# Patient Record
Sex: Female | Born: 1955 | Race: Asian | Hispanic: No | Marital: Married | Smoking: Never smoker
Health system: Southern US, Community
[De-identification: ages and names within clinical notes are randomized; demographics above are authoritative.]

## PROBLEM LIST (undated history)

## (undated) DIAGNOSIS — M810 Age-related osteoporosis without current pathological fracture: Secondary | ICD-10-CM

## (undated) DIAGNOSIS — J302 Other seasonal allergic rhinitis: Secondary | ICD-10-CM

## (undated) DIAGNOSIS — K649 Unspecified hemorrhoids: Secondary | ICD-10-CM

## (undated) DIAGNOSIS — K5900 Constipation, unspecified: Secondary | ICD-10-CM

---

## 2007-08-03 ENCOUNTER — Ambulatory Visit: Payer: Self-pay | Admitting: Gastroenterology

## 2007-08-03 DIAGNOSIS — K5900 Constipation, unspecified: Secondary | ICD-10-CM | POA: Insufficient documentation

## 2007-08-03 DIAGNOSIS — R17 Unspecified jaundice: Secondary | ICD-10-CM | POA: Insufficient documentation

## 2007-08-03 DIAGNOSIS — K625 Hemorrhage of anus and rectum: Secondary | ICD-10-CM | POA: Insufficient documentation

## 2007-08-04 ENCOUNTER — Ambulatory Visit: Payer: Self-pay | Admitting: Gastroenterology

## 2007-08-04 LAB — CONVERTED CEMR LAB
ALT: 20 units/L (ref 0–35)
Albumin: 4.1 g/dL (ref 3.5–5.2)
BUN: 18 mg/dL (ref 6–23)
Basophils Absolute: 0.1 10*3/uL (ref 0.0–0.1)
Bilirubin, Direct: 0.1 mg/dL (ref 0.0–0.3)
CO2: 24 meq/L (ref 19–32)
Chloride: 107 meq/L (ref 96–112)
Eosinophils Absolute: 0 10*3/uL (ref 0.0–0.7)
Ferritin: 11.7 ng/mL (ref 10.0–291.0)
GFR calc Af Amer: 134 mL/min
GFR calc non Af Amer: 111 mL/min
Glucose, Bld: 79 mg/dL (ref 70–99)
Hemoglobin: 12.5 g/dL (ref 12.0–15.0)
Iron: 216 ug/dL — ABNORMAL HIGH (ref 42–145)
Lymphocytes Relative: 22.1 % (ref 12.0–46.0)
MCHC: 34.7 g/dL (ref 30.0–36.0)
Monocytes Relative: 5.2 % (ref 3.0–12.0)
Neutro Abs: 5 10*3/uL (ref 1.4–7.7)
Neutrophils Relative %: 71.6 % (ref 43.0–77.0)
Platelets: 185 10*3/uL (ref 150–400)
Potassium: 4.4 meq/L (ref 3.5–5.1)
RBC: 3.83 M/uL — ABNORMAL LOW (ref 3.87–5.11)
RDW: 13.2 % (ref 11.5–14.6)
Saturation Ratios: 50.4 % — ABNORMAL HIGH (ref 20.0–50.0)
Sodium: 139 meq/L (ref 135–145)
Total Bilirubin: 1.5 mg/dL — ABNORMAL HIGH (ref 0.3–1.2)
Total Protein: 7.1 g/dL (ref 6.0–8.3)
Vitamin B-12: 932 pg/mL — ABNORMAL HIGH (ref 211–911)

## 2007-08-05 ENCOUNTER — Telehealth: Payer: Self-pay | Admitting: Gastroenterology

## 2007-08-05 DIAGNOSIS — K649 Unspecified hemorrhoids: Secondary | ICD-10-CM | POA: Insufficient documentation

## 2007-08-20 ENCOUNTER — Encounter: Payer: Self-pay | Admitting: Gastroenterology

## 2007-09-15 ENCOUNTER — Encounter: Payer: Self-pay | Admitting: Gastroenterology

## 2007-11-30 ENCOUNTER — Encounter (INDEPENDENT_AMBULATORY_CARE_PROVIDER_SITE_OTHER): Payer: Self-pay | Admitting: *Deleted

## 2007-11-30 ENCOUNTER — Ambulatory Visit (HOSPITAL_COMMUNITY): Admission: RE | Admit: 2007-11-30 | Discharge: 2007-11-30 | Payer: Self-pay | Admitting: *Deleted

## 2007-12-04 ENCOUNTER — Emergency Department (HOSPITAL_COMMUNITY): Admission: EM | Admit: 2007-12-04 | Discharge: 2007-12-04 | Payer: Self-pay | Admitting: Family Medicine

## 2008-05-07 ENCOUNTER — Emergency Department (HOSPITAL_COMMUNITY): Admission: EM | Admit: 2008-05-07 | Discharge: 2008-05-07 | Payer: Self-pay | Admitting: Emergency Medicine

## 2009-06-16 ENCOUNTER — Emergency Department (HOSPITAL_COMMUNITY): Admission: EM | Admit: 2009-06-16 | Discharge: 2009-06-16 | Payer: Self-pay | Admitting: Family Medicine

## 2010-01-18 ENCOUNTER — Other Ambulatory Visit
Admission: RE | Admit: 2010-01-18 | Discharge: 2010-01-18 | Payer: Self-pay | Source: Home / Self Care | Admitting: Registered Nurse

## 2010-06-04 HISTORY — PX: HEMORRHOID SURGERY: SHX153

## 2010-06-04 NOTE — Op Note (Signed)
NAME:  Tiffany Stevens, Tiffany Stevens                   ACCOUNT NO.:  [id number]   MEDICAL RECORD NO.:  [id number]          PATIENT TYPE:  AMB   LOCATION:  DAY                          FACILITY:  [facility]   PHYSICIAN:  Alfonse Ras, MD   DATE OF BIRTH:  22-May-1955   DATE OF PROCEDURE:  DATE OF DISCHARGE:                               OPERATIVE REPORT   PREOPERATIVE DIAGNOSIS:  Grade II and III internal hemorrhoids with  prolapse and bleeding.   POSTOPERATIVE DIAGNOSIS:  Grade II and III internal hemorrhoids with  prolapse and bleeding.   PROCEDURE:  Procedure for prolapse and hemorrhoids, rectopexy with the  Ethicon stapler.   SURGEON:  Alfonse Ras, M.D.   ANESTHESIA:  General.   DESCRIPTION:  The patient was taken to the operating room, and after  informed consent was granted, general anesthesia was induced using  endotracheal tube.  She was placed in the prone jack-knife position.  Rectal and perianal prep were undertaken with Betadine solution and the  vagina was also prepped.  Digital rectal exam was dilated up to 3  fingerbreadths.  Using anoscopy, all 3 hemorrhoidal bundles were  injected with 0.25 Marcaine with epinephrine and Wydase.  This was  massaged in place.  The internal and external sphincter muscles were  injected with 0.25 Marcaine circumferentially.  A 2-0 Prolene  pursestring suture was placed in the mucosa and submucosa, 5-cm proximal  to the dentate line.  When this was accomplished, the stapler was  introduced and the pursestring suture was tied down around the anvil.  The stapler was closed.  The vagina was checked.  There was no evidence  of vaginal rectal involvement.  The stapler was held in place for 45  seconds and then fired.  The staple line was inspected.  There was no  bleeding.  Gelfoam packing was placed, 4 x 4 dressings were applied.  The patient tolerated the procedure well and went to PACU in good  condition.      Alfonse Ras, MD  Electronically Signed     KRE/MEDQ  D:  11/30/2007  T:  11/30/2007  Job:  [id number]   cc:   Vania Rea. Jarold Motto, MD, FACG, FACP, FAGA  [address]  [place]  [place] [address]

## 2010-10-22 LAB — HEMOGLOBIN AND HEMATOCRIT, BLOOD
HCT: 36
Hemoglobin: 11.9 — ABNORMAL LOW

## 2011-09-09 ENCOUNTER — Other Ambulatory Visit (HOSPITAL_COMMUNITY): Payer: Self-pay | Admitting: Internal Medicine

## 2011-09-09 DIAGNOSIS — R319 Hematuria, unspecified: Secondary | ICD-10-CM

## 2011-09-10 ENCOUNTER — Other Ambulatory Visit (HOSPITAL_COMMUNITY): Payer: Self-pay

## 2011-09-16 ENCOUNTER — Ambulatory Visit (HOSPITAL_COMMUNITY)
Admission: RE | Admit: 2011-09-16 | Discharge: 2011-09-16 | Disposition: A | Discharge status: Home / Self Care | Payer: 59 | Source: Ambulatory Visit | Attending: Internal Medicine | Admitting: Internal Medicine

## 2011-09-16 DIAGNOSIS — R319 Hematuria, unspecified: Secondary | ICD-10-CM | POA: Insufficient documentation

## 2013-04-04 ENCOUNTER — Other Ambulatory Visit: Payer: Self-pay | Admitting: Registered Nurse

## 2013-04-04 ENCOUNTER — Other Ambulatory Visit (HOSPITAL_COMMUNITY)
Admission: RE | Admit: 2013-04-04 | Discharge: 2013-04-04 | Disposition: A | Discharge status: Home / Self Care | Payer: 59 | Source: Ambulatory Visit | Attending: Internal Medicine | Admitting: Internal Medicine

## 2013-04-04 DIAGNOSIS — Z01419 Encounter for gynecological examination (general) (routine) without abnormal findings: Secondary | ICD-10-CM | POA: Insufficient documentation

## 2014-05-23 ENCOUNTER — Encounter: Payer: Self-pay | Admitting: Gastroenterology

## 2014-06-27 ENCOUNTER — Encounter (HOSPITAL_COMMUNITY): Payer: Self-pay | Admitting: *Deleted

## 2014-06-27 ENCOUNTER — Emergency Department (HOSPITAL_COMMUNITY): Payer: 59 | Admitting: Certified Registered"

## 2014-06-27 ENCOUNTER — Emergency Department (HOSPITAL_COMMUNITY): Payer: 59

## 2014-06-27 ENCOUNTER — Other Ambulatory Visit: Payer: Self-pay

## 2014-06-27 ENCOUNTER — Other Ambulatory Visit (HOSPITAL_COMMUNITY): Payer: Self-pay

## 2014-06-27 ENCOUNTER — Inpatient Hospital Stay (HOSPITAL_COMMUNITY): Payer: 59

## 2014-06-27 ENCOUNTER — Encounter (HOSPITAL_COMMUNITY): Admission: EM | Disposition: A | Discharge status: Rehabilitation Facility | Payer: Self-pay | Source: Home / Self Care

## 2014-06-27 ENCOUNTER — Inpatient Hospital Stay (HOSPITAL_COMMUNITY): Payer: 59 | Admitting: Certified Registered Nurse Anesthetist

## 2014-06-27 ENCOUNTER — Inpatient Hospital Stay (HOSPITAL_COMMUNITY)
Admission: EM | Admit: 2014-06-27 | Discharge: 2014-07-10 | DRG: 003 | Disposition: A | Discharge status: Rehabilitation Facility | Payer: 59 | Attending: General Surgery | Admitting: General Surgery

## 2014-06-27 DIAGNOSIS — E876 Hypokalemia: Secondary | ICD-10-CM | POA: Diagnosis present

## 2014-06-27 DIAGNOSIS — Z09 Encounter for follow-up examination after completed treatment for conditions other than malignant neoplasm: Secondary | ICD-10-CM

## 2014-06-27 DIAGNOSIS — I609 Nontraumatic subarachnoid hemorrhage, unspecified: Secondary | ICD-10-CM

## 2014-06-27 DIAGNOSIS — R413 Other amnesia: Secondary | ICD-10-CM | POA: Diagnosis present

## 2014-06-27 DIAGNOSIS — D696 Thrombocytopenia, unspecified: Secondary | ICD-10-CM | POA: Diagnosis present

## 2014-06-27 DIAGNOSIS — S069X4D Unspecified intracranial injury with loss of consciousness 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, subsequent encounter: Secondary | ICD-10-CM | POA: Diagnosis not present

## 2014-06-27 DIAGNOSIS — S2220XA Unspecified fracture of sternum, initial encounter for closed fracture: Secondary | ICD-10-CM

## 2014-06-27 DIAGNOSIS — S12400A Unspecified displaced fracture of fifth cervical vertebra, initial encounter for closed fracture: Secondary | ICD-10-CM | POA: Diagnosis present

## 2014-06-27 DIAGNOSIS — S022XXA Fracture of nasal bones, initial encounter for closed fracture: Secondary | ICD-10-CM | POA: Diagnosis present

## 2014-06-27 DIAGNOSIS — J9811 Atelectasis: Secondary | ICD-10-CM | POA: Diagnosis not present

## 2014-06-27 DIAGNOSIS — S069X4S Unspecified intracranial injury with loss of consciousness 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, sequela: Secondary | ICD-10-CM | POA: Diagnosis not present

## 2014-06-27 DIAGNOSIS — S12100S Unspecified displaced fracture of second cervical vertebra, sequela: Secondary | ICD-10-CM | POA: Diagnosis not present

## 2014-06-27 DIAGNOSIS — S12600A Unspecified displaced fracture of seventh cervical vertebra, initial encounter for closed fracture: Secondary | ICD-10-CM

## 2014-06-27 DIAGNOSIS — T794XXA Traumatic shock, initial encounter: Secondary | ICD-10-CM | POA: Diagnosis present

## 2014-06-27 DIAGNOSIS — S12100A Unspecified displaced fracture of second cervical vertebra, initial encounter for closed fracture: Secondary | ICD-10-CM | POA: Diagnosis present

## 2014-06-27 DIAGNOSIS — S020XXA Fracture of vault of skull, initial encounter for closed fracture: Secondary | ICD-10-CM | POA: Diagnosis present

## 2014-06-27 DIAGNOSIS — E872 Acidosis: Secondary | ICD-10-CM | POA: Diagnosis present

## 2014-06-27 DIAGNOSIS — J9690 Respiratory failure, unspecified, unspecified whether with hypoxia or hypercapnia: Secondary | ICD-10-CM

## 2014-06-27 DIAGNOSIS — S272XXA Traumatic hemopneumothorax, initial encounter: Secondary | ICD-10-CM | POA: Diagnosis present

## 2014-06-27 DIAGNOSIS — R578 Other shock: Secondary | ICD-10-CM | POA: Diagnosis not present

## 2014-06-27 DIAGNOSIS — S27321A Contusion of lung, unilateral, initial encounter: Secondary | ICD-10-CM | POA: Diagnosis present

## 2014-06-27 DIAGNOSIS — S7002XA Contusion of left hip, initial encounter: Secondary | ICD-10-CM | POA: Diagnosis present

## 2014-06-27 DIAGNOSIS — T1490XA Injury, unspecified, initial encounter: Secondary | ICD-10-CM

## 2014-06-27 DIAGNOSIS — S02401A Maxillary fracture, unspecified, initial encounter for closed fracture: Secondary | ICD-10-CM | POA: Diagnosis present

## 2014-06-27 DIAGNOSIS — R1310 Dysphagia, unspecified: Secondary | ICD-10-CM | POA: Diagnosis present

## 2014-06-27 DIAGNOSIS — M810 Age-related osteoporosis without current pathological fracture: Secondary | ICD-10-CM | POA: Diagnosis present

## 2014-06-27 DIAGNOSIS — R339 Retention of urine, unspecified: Secondary | ICD-10-CM | POA: Diagnosis not present

## 2014-06-27 DIAGNOSIS — N390 Urinary tract infection, site not specified: Secondary | ICD-10-CM | POA: Diagnosis not present

## 2014-06-27 DIAGNOSIS — Z4659 Encounter for fitting and adjustment of other gastrointestinal appliance and device: Secondary | ICD-10-CM

## 2014-06-27 DIAGNOSIS — S12110A Anterior displaced Type II dens fracture, initial encounter for closed fracture: Secondary | ICD-10-CM | POA: Diagnosis present

## 2014-06-27 DIAGNOSIS — J939 Pneumothorax, unspecified: Secondary | ICD-10-CM

## 2014-06-27 DIAGNOSIS — IMO0002 Reserved for concepts with insufficient information to code with codable children: Secondary | ICD-10-CM

## 2014-06-27 DIAGNOSIS — S066X1A Traumatic subarachnoid hemorrhage with loss of consciousness of 30 minutes or less, initial encounter: Secondary | ICD-10-CM | POA: Diagnosis present

## 2014-06-27 DIAGNOSIS — S12101A Unspecified nondisplaced fracture of second cervical vertebra, initial encounter for closed fracture: Principal | ICD-10-CM | POA: Diagnosis present

## 2014-06-27 DIAGNOSIS — S069X1S Unspecified intracranial injury with loss of consciousness of 30 minutes or less, sequela: Secondary | ICD-10-CM | POA: Diagnosis not present

## 2014-06-27 DIAGNOSIS — J942 Hemothorax: Secondary | ICD-10-CM

## 2014-06-27 DIAGNOSIS — R55 Syncope and collapse: Secondary | ICD-10-CM | POA: Diagnosis present

## 2014-06-27 DIAGNOSIS — S299XXA Unspecified injury of thorax, initial encounter: Secondary | ICD-10-CM

## 2014-06-27 DIAGNOSIS — S27329A Contusion of lung, unspecified, initial encounter: Secondary | ICD-10-CM | POA: Diagnosis present

## 2014-06-27 DIAGNOSIS — Z0189 Encounter for other specified special examinations: Secondary | ICD-10-CM

## 2014-06-27 DIAGNOSIS — S023XXA Fracture of orbital floor, initial encounter for closed fracture: Secondary | ICD-10-CM | POA: Diagnosis present

## 2014-06-27 DIAGNOSIS — J156 Pneumonia due to other aerobic Gram-negative bacteria: Secondary | ICD-10-CM | POA: Diagnosis not present

## 2014-06-27 DIAGNOSIS — T1490 Injury, unspecified: Secondary | ICD-10-CM | POA: Diagnosis present

## 2014-06-27 DIAGNOSIS — S8002XA Contusion of left knee, initial encounter: Secondary | ICD-10-CM | POA: Diagnosis present

## 2014-06-27 DIAGNOSIS — M79609 Pain in unspecified limb: Secondary | ICD-10-CM | POA: Diagnosis not present

## 2014-06-27 DIAGNOSIS — S2242XS Multiple fractures of ribs, left side, sequela: Secondary | ICD-10-CM | POA: Diagnosis not present

## 2014-06-27 DIAGNOSIS — S12500A Unspecified displaced fracture of sixth cervical vertebra, initial encounter for closed fracture: Secondary | ICD-10-CM | POA: Diagnosis present

## 2014-06-27 DIAGNOSIS — S12100G Unspecified displaced fracture of second cervical vertebra, subsequent encounter for fracture with delayed healing: Secondary | ICD-10-CM | POA: Diagnosis not present

## 2014-06-27 DIAGNOSIS — S0219XA Other fracture of base of skull, initial encounter for closed fracture: Secondary | ICD-10-CM | POA: Diagnosis present

## 2014-06-27 DIAGNOSIS — R001 Bradycardia, unspecified: Secondary | ICD-10-CM | POA: Diagnosis present

## 2014-06-27 DIAGNOSIS — S069X4A Unspecified intracranial injury with loss of consciousness 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, initial encounter: Secondary | ICD-10-CM | POA: Diagnosis present

## 2014-06-27 DIAGNOSIS — J9600 Acute respiratory failure, unspecified whether with hypoxia or hypercapnia: Secondary | ICD-10-CM | POA: Diagnosis present

## 2014-06-27 DIAGNOSIS — S8001XA Contusion of right knee, initial encounter: Secondary | ICD-10-CM | POA: Diagnosis present

## 2014-06-27 DIAGNOSIS — S2232XA Fracture of one rib, left side, initial encounter for closed fracture: Secondary | ICD-10-CM

## 2014-06-27 DIAGNOSIS — R221 Localized swelling, mass and lump, neck: Secondary | ICD-10-CM

## 2014-06-27 DIAGNOSIS — J90 Pleural effusion, not elsewhere classified: Secondary | ICD-10-CM | POA: Diagnosis present

## 2014-06-27 DIAGNOSIS — S0292XS Unspecified fracture of facial bones, sequela: Secondary | ICD-10-CM | POA: Diagnosis not present

## 2014-06-27 DIAGNOSIS — T884XXA Failed or difficult intubation, initial encounter: Secondary | ICD-10-CM

## 2014-06-27 DIAGNOSIS — S2242XA Multiple fractures of ribs, left side, initial encounter for closed fracture: Secondary | ICD-10-CM | POA: Diagnosis present

## 2014-06-27 DIAGNOSIS — S0990XA Unspecified injury of head, initial encounter: Secondary | ICD-10-CM

## 2014-06-27 DIAGNOSIS — S41112A Laceration without foreign body of left upper arm, initial encounter: Secondary | ICD-10-CM | POA: Diagnosis present

## 2014-06-27 DIAGNOSIS — S0292XA Unspecified fracture of facial bones, initial encounter for closed fracture: Secondary | ICD-10-CM

## 2014-06-27 DIAGNOSIS — Z930 Tracheostomy status: Secondary | ICD-10-CM | POA: Diagnosis not present

## 2014-06-27 DIAGNOSIS — S12600S Unspecified displaced fracture of seventh cervical vertebra, sequela: Secondary | ICD-10-CM | POA: Diagnosis not present

## 2014-06-27 DIAGNOSIS — D62 Acute posthemorrhagic anemia: Secondary | ICD-10-CM | POA: Diagnosis not present

## 2014-06-27 DIAGNOSIS — Z9689 Presence of other specified functional implants: Secondary | ICD-10-CM

## 2014-06-27 DIAGNOSIS — M4322 Fusion of spine, cervical region: Secondary | ICD-10-CM

## 2014-06-27 DIAGNOSIS — S06331A Contusion and laceration of cerebrum, unspecified, with loss of consciousness of 30 minutes or less, initial encounter: Secondary | ICD-10-CM | POA: Diagnosis present

## 2014-06-27 HISTORY — DX: Constipation, unspecified: K59.00

## 2014-06-27 HISTORY — DX: Age-related osteoporosis without current pathological fracture: M81.0

## 2014-06-27 HISTORY — DX: Other seasonal allergic rhinitis: J30.2

## 2014-06-27 HISTORY — DX: Unspecified hemorrhoids: K64.9

## 2014-06-27 HISTORY — PX: ANTERIOR CERVICAL DECOMP/DISCECTOMY FUSION: SHX1161

## 2014-06-27 LAB — COMPREHENSIVE METABOLIC PANEL
ALT: 112 U/L — AB (ref 14–54)
AST: 177 U/L — AB (ref 15–41)
Albumin: 3.3 g/dL — ABNORMAL LOW (ref 3.5–5.0)
Alkaline Phosphatase: 44 U/L (ref 38–126)
Anion gap: 11 (ref 5–15)
BUN: 10 mg/dL (ref 6–20)
CALCIUM: 7.7 mg/dL — AB (ref 8.9–10.3)
CO2: 20 mmol/L — ABNORMAL LOW (ref 22–32)
CREATININE: 0.62 mg/dL (ref 0.44–1.00)
Chloride: 106 mmol/L (ref 101–111)
GFR calc non Af Amer: >60 mL/min (ref >60)
Glucose, Bld: 156 mg/dL — ABNORMAL HIGH (ref 65–99)
POTASSIUM: 3 mmol/L — AB (ref 3.5–5.1)
Sodium: 137 mmol/L (ref 135–145)
TOTAL PROTEIN: 5.7 g/dL — AB (ref 6.5–8.1)
Total Bilirubin: 0.7 mg/dL (ref 0.3–1.2)

## 2014-06-27 LAB — CBC
HEMATOCRIT: 33.9 % — AB (ref 36.0–46.0)
HEMATOCRIT: 31 % — AB (ref 36.0–46.0)
Hemoglobin: 11.3 g/dL — ABNORMAL LOW (ref 12.0–15.0)
Hemoglobin: 10.8 g/dL — ABNORMAL LOW (ref 12.0–15.0)
MCH: 32.5 pg (ref 26.0–34.0)
MCH: 30.6 pg (ref 26.0–34.0)
MCHC: 33.3 g/dL (ref 30.0–36.0)
MCHC: 34.8 g/dL (ref 30.0–36.0)
MCV: 97.4 fL (ref 78.0–100.0)
MCV: 87.8 fL (ref 78.0–100.0)
PLATELETS: 90 10*3/uL — AB (ref 150–400)
Platelets: 205 10*3/uL (ref 150–400)
RBC: 3.48 MIL/uL — AB (ref 3.87–5.11)
RBC: 3.53 MIL/uL — ABNORMAL LOW (ref 3.87–5.11)
RDW: 13.3 % (ref 11.5–15.5)
RDW: 16.1 % — ABNORMAL HIGH (ref 11.5–15.5)
WBC: 12.5 10*3/uL — AB (ref 4.0–10.5)
WBC: 7.8 10*3/uL (ref 4.0–10.5)

## 2014-06-27 LAB — TRIGLYCERIDES: Triglycerides: 108 mg/dL (ref <150)

## 2014-06-27 LAB — I-STAT CHEM 8, ED
BUN: 13 mg/dL (ref 6–20)
CALCIUM ION: 1.08 mmol/L — AB (ref 1.13–1.30)
CREATININE: 0.6 mg/dL (ref 0.44–1.00)
Chloride: 107 mmol/L (ref 101–111)
Glucose, Bld: 156 mg/dL — ABNORMAL HIGH (ref 65–99)
HCT: 33 % — ABNORMAL LOW (ref 36.0–46.0)
HEMOGLOBIN: 11.2 g/dL — AB (ref 12.0–15.0)
Potassium: 3.1 mmol/L — ABNORMAL LOW (ref 3.5–5.1)
Sodium: 141 mmol/L (ref 135–145)
TCO2: 19 mmol/L (ref 0–100)

## 2014-06-27 LAB — URINALYSIS, ROUTINE W REFLEX MICROSCOPIC
Bilirubin Urine: NEGATIVE
GLUCOSE, UA: NEGATIVE mg/dL
KETONES UR: 15 mg/dL — AB
Leukocytes, UA: NEGATIVE
NITRITE: NEGATIVE
PROTEIN: NEGATIVE mg/dL
SPECIFIC GRAVITY, URINE: 1.02 (ref 1.005–1.030)
Urobilinogen, UA: 0.2 mg/dL (ref 0.0–1.0)
pH: 7 (ref 5.0–8.0)

## 2014-06-27 LAB — ETHANOL: Alcohol, Ethyl (B): <5 mg/dL (ref <5)

## 2014-06-27 LAB — POCT I-STAT 3, ART BLOOD GAS (G3+)
ACID-BASE DEFICIT: 5 mmol/L — AB (ref 0.0–2.0)
Bicarbonate: 21 meq/L (ref 20.0–24.0)
O2 Saturation: 100 %
PH ART: 7.336 — AB (ref 7.350–7.450)
Patient temperature: 34.5
TCO2: 22 mmol/L (ref 0–100)
pCO2 arterial: 38.1 mmHg (ref 35.0–45.0)
pO2, Arterial: 372 mmHg — ABNORMAL HIGH (ref 80.0–100.0)

## 2014-06-27 LAB — PROTIME-INR
INR: 1.23 (ref 0.00–1.49)
Prothrombin Time: 15.7 seconds — ABNORMAL HIGH (ref 11.6–15.2)

## 2014-06-27 LAB — CDS SEROLOGY

## 2014-06-27 LAB — BLOOD PRODUCT ORDER (VERBAL) VERIFICATION

## 2014-06-27 LAB — MRSA PCR SCREENING: MRSA BY PCR: NEGATIVE

## 2014-06-27 LAB — I-STAT CG4 LACTIC ACID, ED: LACTIC ACID, VENOUS: 1.6 mmol/L (ref 0.5–2.0)

## 2014-06-27 LAB — URINE MICROSCOPIC-ADD ON

## 2014-06-27 LAB — PREPARE RBC (CROSSMATCH)

## 2014-06-27 SURGERY — ANTERIOR CERVICAL DECOMPRESSION/DISCECTOMY FUSION 1 LEVEL
Anesthesia: General | Site: Spine Cervical

## 2014-06-27 MED ORDER — ROCURONIUM BROMIDE 50 MG/5ML IV SOLN
INTRAVENOUS | Status: AC | PRN
  Administered 2014-06-27: 50 mg via INTRAVENOUS

## 2014-06-27 MED ORDER — SODIUM CHLORIDE 0.9 % IV SOLN: INTRAVENOUS | Status: DC | PRN

## 2014-06-27 MED ORDER — MICROFIBRILLAR COLL HEMOSTAT EX PADS
MEDICATED_PAD | CUTANEOUS | Status: DC | PRN
  Administered 2014-06-27 (×2): 1 via TOPICAL

## 2014-06-27 MED ORDER — FENTANYL CITRATE (PF) 100 MCG/2ML IJ SOLN
50.0000 ug | Freq: Once | INTRAMUSCULAR | Status: AC
  Administered 2014-07-04: 50 ug via INTRAVENOUS
  Administered 2014-07-04 (×2): 100 ug via INTRAVENOUS

## 2014-06-27 MED ORDER — FENTANYL CITRATE (PF) 2500 MCG/50ML IJ SOLN
10.0000 ug/h | INTRAMUSCULAR | Status: DC
  Administered 2014-06-27: 25 ug/h via INTRAVENOUS
  Filled 2014-06-27: qty 50

## 2014-06-27 MED ORDER — LACTATED RINGERS IV SOLN
INTRAVENOUS | Status: DC | PRN
  Administered 2014-06-27 (×2): via INTRAVENOUS

## 2014-06-27 MED ORDER — CHLORHEXIDINE GLUCONATE 0.12 % MT SOLN
15.0000 mL | Freq: Two times a day (BID) | OROMUCOSAL | Status: DC
  Administered 2014-06-27 – 2014-07-10 (×26): 15 mL via OROMUCOSAL
  Filled 2014-06-27 (×28): qty 15

## 2014-06-27 MED ORDER — ALBUMIN HUMAN 5 % IV SOLN
INTRAVENOUS | Status: AC
Start: 2014-06-27 — End: 2014-06-28
  Filled 2014-06-27: qty 250

## 2014-06-27 MED ORDER — SODIUM CHLORIDE 0.9 % IV BOLUS (SEPSIS)
500.0000 mL | Freq: Once | INTRAVENOUS | Status: AC
  Administered 2014-06-27: 500 mL via INTRAVENOUS

## 2014-06-27 MED ORDER — SODIUM CHLORIDE 0.9 % IV SOLN: 1000.0000 mL | Freq: Once | INTRAVENOUS | Status: DC

## 2014-06-27 MED ORDER — FENTANYL CITRATE (PF) 100 MCG/2ML IJ SOLN
INTRAMUSCULAR | Status: DC | PRN
  Administered 2014-06-27: 50 ug via INTRAVENOUS

## 2014-06-27 MED ORDER — ROCURONIUM BROMIDE 100 MG/10ML IV SOLN
INTRAVENOUS | Status: DC | PRN
  Administered 2014-06-27 (×2): 50 mg via INTRAVENOUS

## 2014-06-27 MED ORDER — SODIUM CHLORIDE 0.9 % IV BOLUS (SEPSIS)
2000.0000 mL | Freq: Once | INTRAVENOUS | Status: AC
  Administered 2014-06-27: 2000 mL via INTRAVENOUS

## 2014-06-27 MED ORDER — DEXTROSE 5 % IV SOLN
0.0000 ug/min | INTRAVENOUS | Status: DC
  Administered 2014-06-27: 20 ug/min via INTRAVENOUS
  Administered 2014-06-27: 60 ug/min via INTRAVENOUS
  Administered 2014-06-28: 70 ug/min via INTRAVENOUS
  Administered 2014-06-28 (×2): 60 ug/min via INTRAVENOUS
  Filled 2014-06-27 (×5): qty 1

## 2014-06-27 MED ORDER — PHENYLEPHRINE HCL 10 MG/ML IJ SOLN
10.0000 mg | INTRAVENOUS | Status: DC | PRN
  Administered 2014-06-27: 40 ug/min via INTRAVENOUS

## 2014-06-27 MED ORDER — PANTOPRAZOLE SODIUM 40 MG IV SOLR
40.0000 mg | Freq: Every day | INTRAVENOUS | Status: DC
  Administered 2014-06-27 – 2014-06-28 (×2): 40 mg via INTRAVENOUS
  Filled 2014-06-27 (×3): qty 40

## 2014-06-27 MED ORDER — THROMBIN 5000 UNITS EX SOLR
CUTANEOUS | Status: DC | PRN
  Administered 2014-06-27 (×2): 5000 [IU] via TOPICAL

## 2014-06-27 MED ORDER — ALBUMIN HUMAN 5 % IV SOLN
INTRAVENOUS | Status: AC
  Filled 2014-06-27: qty 250

## 2014-06-27 MED ORDER — ONDANSETRON HCL 4 MG/2ML IJ SOLN: 4.0000 mg | Freq: Four times a day (QID) | INTRAMUSCULAR | Status: DC | PRN

## 2014-06-27 MED ORDER — PROPOFOL 10 MG/ML IV BOLUS
10.0000 mg | Freq: Once | INTRAVENOUS | Status: AC
  Administered 2014-06-27: 10 mg via INTRAVENOUS

## 2014-06-27 MED ORDER — 0.9 % SODIUM CHLORIDE (POUR BTL) OPTIME
TOPICAL | Status: DC | PRN
  Administered 2014-06-27: 1000 mL

## 2014-06-27 MED ORDER — ETOMIDATE 2 MG/ML IV SOLN
INTRAVENOUS | Status: AC | PRN
  Administered 2014-06-27 (×2): 20 mg via INTRAVENOUS

## 2014-06-27 MED ORDER — FENTANYL CITRATE (PF) 250 MCG/5ML IJ SOLN
INTRAMUSCULAR | Status: AC
  Filled 2014-06-27: qty 5

## 2014-06-27 MED ORDER — DEXAMETHASONE SODIUM PHOSPHATE 10 MG/ML IJ SOLN
INTRAMUSCULAR | Status: DC | PRN
  Administered 2014-06-27: 10 mg via INTRAVENOUS

## 2014-06-27 MED ORDER — SODIUM CHLORIDE 0.9 % IV SOLN
25.0000 ug/h | INTRAVENOUS | Status: DC
  Administered 2014-06-27: 250 ug/h via INTRAVENOUS
  Administered 2014-06-27: 25 ug/h via INTRAVENOUS
  Administered 2014-06-28: 150 ug/h via INTRAVENOUS
  Administered 2014-06-29 – 2014-06-30 (×4): 200 ug/h via INTRAVENOUS
  Administered 2014-07-01: 250 ug/h via INTRAVENOUS
  Administered 2014-07-02: 200 ug/h via INTRAVENOUS
  Administered 2014-07-02: 300 ug/h via INTRAVENOUS
  Administered 2014-07-03: 200 ug/h via INTRAVENOUS
  Administered 2014-07-03: 250 ug/h via INTRAVENOUS
  Administered 2014-07-04: 300 ug/h via INTRAVENOUS
  Administered 2014-07-04 – 2014-07-05 (×3): 325 ug/h via INTRAVENOUS
  Administered 2014-07-05: 350 ug/h via INTRAVENOUS
  Administered 2014-07-05: 325 ug/h via INTRAVENOUS
  Administered 2014-07-06: 100 ug/h via INTRAVENOUS
  Filled 2014-06-27 (×22): qty 50

## 2014-06-27 MED ORDER — MIDAZOLAM HCL 2 MG/2ML IJ SOLN
INTRAMUSCULAR | Status: AC
  Filled 2014-06-27: qty 2

## 2014-06-27 MED ORDER — PANTOPRAZOLE SODIUM 40 MG PO TBEC: 40.0000 mg | DELAYED_RELEASE_TABLET | Freq: Every day | ORAL | Status: DC

## 2014-06-27 MED ORDER — CEFAZOLIN SODIUM-DEXTROSE 2-3 GM-% IV SOLR
INTRAVENOUS | Status: DC | PRN
  Administered 2014-06-27: 2 g via INTRAVENOUS

## 2014-06-27 MED ORDER — THROMBIN 5000 UNITS EX SOLR
OROMUCOSAL | Status: DC | PRN
  Administered 2014-06-27: 5 mL via TOPICAL

## 2014-06-27 MED ORDER — ONDANSETRON HCL 4 MG PO TABS: 4.0000 mg | ORAL_TABLET | Freq: Four times a day (QID) | ORAL | Status: DC | PRN

## 2014-06-27 MED ORDER — ALBUMIN HUMAN 5 % IV SOLN
25.0000 g | Freq: Once | INTRAVENOUS | Status: AC
  Administered 2014-06-27: 25 g via INTRAVENOUS
  Filled 2014-06-27: qty 500

## 2014-06-27 MED ORDER — PROPOFOL 1000 MG/100ML IV EMUL
5.0000 ug/kg/min | INTRAVENOUS | Status: DC
  Administered 2014-06-27 (×2): 10 ug/kg/min via INTRAVENOUS
  Administered 2014-06-27: 27.778 ug/kg/min via INTRAVENOUS
  Administered 2014-06-28: 30 ug/kg/min via INTRAVENOUS
  Administered 2014-06-28: 15 ug/kg/min via INTRAVENOUS
  Administered 2014-06-29: 20 ug/kg/min via INTRAVENOUS
  Administered 2014-06-29: 10 ug/kg/min via INTRAVENOUS
  Administered 2014-06-29: 30 ug/kg/min via INTRAVENOUS
  Filled 2014-06-27 (×5): qty 100

## 2014-06-27 MED ORDER — SUCCINYLCHOLINE CHLORIDE 20 MG/ML IJ SOLN
INTRAMUSCULAR | Status: AC | PRN
Start: 2014-06-27 — End: 2014-06-27
  Administered 2014-06-27: 100 mg via INTRAVENOUS

## 2014-06-27 MED ORDER — SODIUM CHLORIDE 0.9 % IV SOLN
INTRAVENOUS | Status: DC | PRN
  Administered 2014-06-27 (×2): via INTRAVENOUS

## 2014-06-27 MED ORDER — POTASSIUM CHLORIDE IN NACL 20-0.9 MEQ/L-% IV SOLN
INTRAVENOUS | Status: DC
  Administered 2014-06-27 – 2014-06-29 (×4): via INTRAVENOUS
  Administered 2014-06-29: 125 mL/h via INTRAVENOUS
  Administered 2014-06-30 – 2014-07-01 (×4): via INTRAVENOUS
  Administered 2014-07-01 – 2014-07-02 (×2): 125 mL/h via INTRAVENOUS
  Administered 2014-07-03 (×2): via INTRAVENOUS
  Administered 2014-07-04: 100 mL/h via INTRAVENOUS
  Administered 2014-07-04 (×2): via INTRAVENOUS
  Administered 2014-07-05: 100 mL/h via INTRAVENOUS
  Administered 2014-07-05 – 2014-07-06 (×3): via INTRAVENOUS
  Filled 2014-06-27 (×37): qty 1000

## 2014-06-27 MED ORDER — ACETAMINOPHEN 650 MG RE SUPP
650.0000 mg | RECTAL | Status: DC | PRN
  Administered 2014-06-30: 650 mg via RECTAL
  Filled 2014-06-27 (×2): qty 1

## 2014-06-27 MED ORDER — POTASSIUM CHLORIDE 10 MEQ/100ML IV SOLN
10.0000 meq | INTRAVENOUS | Status: AC
  Administered 2014-06-27 (×2): 10 meq via INTRAVENOUS
  Filled 2014-06-27 (×2): qty 100

## 2014-06-27 MED ORDER — IPRATROPIUM-ALBUTEROL 0.5-2.5 (3) MG/3ML IN SOLN
3.0000 mL | Freq: Four times a day (QID) | RESPIRATORY_TRACT | Status: DC
  Administered 2014-06-27 – 2014-07-10 (×52): 3 mL via RESPIRATORY_TRACT
  Filled 2014-06-27 (×53): qty 3

## 2014-06-27 MED ORDER — DOCUSATE SODIUM 50 MG/5ML PO LIQD
100.0000 mg | Freq: Two times a day (BID) | ORAL | Status: DC | PRN
  Administered 2014-07-01 – 2014-07-02 (×2): 100 mg
  Filled 2014-06-27 (×4): qty 10

## 2014-06-27 MED ORDER — PROPOFOL 1000 MG/100ML IV EMUL: 0.0000 ug/kg/min | INTRAVENOUS | Status: DC

## 2014-06-27 MED ORDER — FENTANYL BOLUS VIA INFUSION
50.0000 ug | INTRAVENOUS | Status: DC | PRN
  Filled 2014-06-27: qty 50

## 2014-06-27 MED ORDER — IOHEXOL 300 MG/ML  SOLN
100.0000 mL | Freq: Once | INTRAMUSCULAR | Status: AC | PRN
  Administered 2014-06-27: 100 mL via INTRAVENOUS

## 2014-06-27 MED ORDER — ALBUMIN HUMAN 5 % IV SOLN
INTRAVENOUS | Status: DC | PRN
  Administered 2014-06-27 (×2): via INTRAVENOUS

## 2014-06-27 MED ORDER — CETYLPYRIDINIUM CHLORIDE 0.05 % MT LIQD
7.0000 mL | Freq: Four times a day (QID) | OROMUCOSAL | Status: DC
  Administered 2014-06-28 – 2014-07-10 (×51): 7 mL via OROMUCOSAL

## 2014-06-27 MED ORDER — HEMOSTATIC AGENTS (NO CHARGE) OPTIME
TOPICAL | Status: DC | PRN
  Administered 2014-06-27: 1 via TOPICAL

## 2014-06-27 SURGICAL SUPPLY — 88 items
BAG DECANTER FOR FLEXI CONT (MISCELLANEOUS) IMPLANT
BENZOIN TINCTURE PRP APPL 2/3 (GAUZE/BANDAGES/DRESSINGS) ×3 IMPLANT
BIT DRILL SPINE QC 12 (BIT) ×3 IMPLANT
BLADE CLIPPER SURG (BLADE) IMPLANT
BLADE SURG 11 STRL SS (BLADE) IMPLANT
BLADE ULTRA TIP 2M (BLADE) IMPLANT
BNDG GAUZE ELAST 4 BULKY (GAUZE/BANDAGES/DRESSINGS) ×6 IMPLANT
BRUSH SCRUB EZ 1% IODOPHOR (MISCELLANEOUS) IMPLANT
BRUSH SCRUB EZ PLAIN DRY (MISCELLANEOUS) IMPLANT
BUR ACORN 6.0 ACORN (BURR) IMPLANT
BUR BARREL STRAIGHT FLUTE 4.0 (BURR) IMPLANT
BUR MATCHSTICK NEURO 3.0 LAGG (BURR) ×3 IMPLANT
BUR ROUND FLUTED 5 RND (BURR) ×3 IMPLANT
CANISTER SUCT 3000ML PPV (MISCELLANEOUS) ×3 IMPLANT
CONT SPEC 4OZ CLIKSEAL STRL BL (MISCELLANEOUS) ×3 IMPLANT
COVER MAYO STAND STRL (DRAPES) ×3 IMPLANT
DECANTER SPIKE VIAL GLASS SM (MISCELLANEOUS) IMPLANT
DERMABOND ADVANCED (GAUZE/BANDAGES/DRESSINGS)
DERMABOND ADVANCED .7 DNX12 (GAUZE/BANDAGES/DRESSINGS) IMPLANT
DRAPE C-ARM 42X72 X-RAY (DRAPES) IMPLANT
DRAPE INCISE IOBAN 66X45 STRL (DRAPES) IMPLANT
DRAPE LAPAROTOMY 100X72 PEDS (DRAPES) ×3 IMPLANT
DRAPE LAPAROTOMY 100X72X124 (DRAPES) IMPLANT
DRAPE MICROSCOPE LEICA (MISCELLANEOUS) ×3 IMPLANT
DRAPE ORTHO SPLIT 77X108 STRL (DRAPES)
DRAPE POUCH INSTRU U-SHP 10X18 (DRAPES) ×3 IMPLANT
DRAPE SURG ORHT 6 SPLT 77X108 (DRAPES) IMPLANT
DURAPREP 26ML APPLICATOR (WOUND CARE) ×3 IMPLANT
DURAPREP 6ML APPLICATOR 50/CS (WOUND CARE) IMPLANT
ELECT BLADE 4.0 EZ CLEAN MEGAD (MISCELLANEOUS)
ELECT REM PT RETURN 9FT ADLT (ELECTROSURGICAL) ×3
ELECTRODE BLDE 4.0 EZ CLN MEGD (MISCELLANEOUS) IMPLANT
ELECTRODE REM PT RTRN 9FT ADLT (ELECTROSURGICAL) ×2 IMPLANT
EVACUATOR 1/8 PVC DRAIN (DRAIN) IMPLANT
EVACUATOR 3/16  PVC DRAIN (DRAIN) ×1
EVACUATOR 3/16 PVC DRAIN (DRAIN) ×2 IMPLANT
GAUZE SPONGE 4X4 12PLY STRL (GAUZE/BANDAGES/DRESSINGS) ×3 IMPLANT
GAUZE SPONGE 4X4 16PLY XRAY LF (GAUZE/BANDAGES/DRESSINGS) IMPLANT
GLOVE BIOGEL M 8.0 STRL (GLOVE) ×3 IMPLANT
GLOVE BIOGEL PI IND STRL 8.5 (GLOVE) ×2 IMPLANT
GLOVE BIOGEL PI INDICATOR 8.5 (GLOVE) ×1
GLOVE ECLIPSE 8.5 STRL (GLOVE) ×3 IMPLANT
GLOVE EXAM NITRILE LRG STRL (GLOVE) IMPLANT
GLOVE EXAM NITRILE MD LF STRL (GLOVE) IMPLANT
GLOVE EXAM NITRILE XL STR (GLOVE) IMPLANT
GLOVE EXAM NITRILE XS STR PU (GLOVE) IMPLANT
GOWN STRL REUS W/ TWL LRG LVL3 (GOWN DISPOSABLE) ×6 IMPLANT
GOWN STRL REUS W/ TWL XL LVL3 (GOWN DISPOSABLE) ×2 IMPLANT
GOWN STRL REUS W/TWL 2XL LVL3 (GOWN DISPOSABLE) ×6 IMPLANT
GOWN STRL REUS W/TWL LRG LVL3 (GOWN DISPOSABLE) ×3
GOWN STRL REUS W/TWL XL LVL3 (GOWN DISPOSABLE) ×1
GRAFT DURAGEN MATRIX 1WX1L (Tissue) ×3 IMPLANT
HEMOSTAT POWDER KIT SURGIFOAM (HEMOSTASIS) ×3 IMPLANT
KIT BASIN OR (CUSTOM PROCEDURE TRAY) ×3 IMPLANT
KIT ROOM TURNOVER OR (KITS) ×3 IMPLANT
NEEDLE HYPO 18GX1.5 BLUNT FILL (NEEDLE) IMPLANT
NEEDLE HYPO 21X1.5 SAFETY (NEEDLE) IMPLANT
NEEDLE SPNL 20GX3.5 QUINCKE YW (NEEDLE) IMPLANT
NEEDLE SPNL 22GX3.5 QUINCKE BK (NEEDLE) ×6 IMPLANT
NS IRRIG 1000ML POUR BTL (IV SOLUTION) ×3 IMPLANT
PACK LAMINECTOMY NEURO (CUSTOM PROCEDURE TRAY) ×3 IMPLANT
PAD ARMBOARD 7.5X6 YLW CONV (MISCELLANEOUS) IMPLANT
PATTIES SURGICAL .25X.25 (GAUZE/BANDAGES/DRESSINGS) IMPLANT
PATTIES SURGICAL .5 X1 (DISPOSABLE) IMPLANT
PIN MAYFIELD SKULL DISP (PIN) IMPLANT
PLATE ANT CERV XTEND 1 LV 18 (Plate) ×3 IMPLANT
RUBBERBAND STERILE (MISCELLANEOUS) ×6 IMPLANT
SCREW XTD VAR 4.2 SELF TAP 12 (Screw) ×12 IMPLANT
SPACER CERV FRGE 12X14X10-7 (Spacer) ×3 IMPLANT
SPONGE INTESTINAL PEANUT (DISPOSABLE) ×6 IMPLANT
SPONGE LAP 4X18 X RAY DECT (DISPOSABLE) IMPLANT
SPONGE SURGIFOAM ABS GEL SZ50 (HEMOSTASIS) ×3 IMPLANT
STAPLER SKIN PROX WIDE 3.9 (STAPLE) IMPLANT
STRIP CLOSURE SKIN 1/2X4 (GAUZE/BANDAGES/DRESSINGS) ×3 IMPLANT
SUT ETHILON 3 0 FSL (SUTURE) IMPLANT
SUT ETHILON 4 0 PS 2 18 (SUTURE) IMPLANT
SUT VIC AB 0 CT1 18XCR BRD 8 (SUTURE) IMPLANT
SUT VIC AB 0 CT1 18XCR BRD8 (SUTURE) ×2 IMPLANT
SUT VIC AB 0 CT1 8-18 (SUTURE) ×1
SUT VIC AB 2-0 CP2 18 (SUTURE) IMPLANT
SUT VIC AB 3-0 SH 8-18 (SUTURE) ×6 IMPLANT
SYR 20ML ECCENTRIC (SYRINGE) ×3 IMPLANT
SYR 3ML LL SCALE MARK (SYRINGE) IMPLANT
TAPE CLOTH SURG 4X10 WHT LF (GAUZE/BANDAGES/DRESSINGS) ×3 IMPLANT
TOWEL OR 17X24 6PK STRL BLUE (TOWEL DISPOSABLE) ×3 IMPLANT
TOWEL OR 17X26 10 PK STRL BLUE (TOWEL DISPOSABLE) ×3 IMPLANT
UNDERPAD 30X30 INCONTINENT (UNDERPADS AND DIAPERS) IMPLANT
WATER STERILE IRR 1000ML POUR (IV SOLUTION) ×3 IMPLANT

## 2014-06-27 NOTE — Progress Notes (Signed)
Patient ID: [id number] S Grams, female   DOB: 09/19/1955, 59 y.o.   MRN: [id number] I spoke with her husband and updated him on her injuries. Violeta GelinasBurke Birttany Dechellis, MD, MPH, FACS Trauma: [phone] General Surgery: [phone]
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
## 2014-06-27 NOTE — Transfer of Care (Signed)
Immediate Anesthesia Transfer of Care Note  Patient: Tiffany Stevens  Procedure(s) Performed: * No procedures listed *  Patient Location: PACU and ED only  Anesthesia Type:General  Level of Consciousness: unresponsive  Airway & Oxygen Therapy: Patient remains intubated per anesthesia plan  Post-op Assessment: Report given to RN  Post vital signs: Reviewed  Last Vitals:  Filed Vitals:   06/27/14 1013  BP: 108/74  Pulse: 89  Resp: 21    Complications: No apparent anesthesia complications

## 2014-06-27 NOTE — Anesthesia Preprocedure Evaluation (Addendum)
Anesthesia Evaluation  Patient identified by MRN, date of birth, ID band Patient unresponsive    Reviewed: Unable to perform ROS - Chart review onlyPreop documentation limited or incomplete due to emergent nature of procedure.  Airway Mallampati: Intubated       Dental   Pulmonary  breath sounds clear to auscultation        Cardiovascular Rate:Tachycardia     Neuro/Psych    GI/Hepatic   Endo/Other    Renal/GU      Musculoskeletal   Abdominal   Peds  Hematology   Anesthesia Other Findings   Reproductive/Obstetrics                            Anesthesia Physical Anesthesia Plan  ASA: IV and emergent  Anesthesia Plan: General   Post-op Pain Management:    Induction:   Airway Management Planned: Oral ETT  Additional Equipment: Arterial line  Intra-op Plan:   Post-operative Plan: Post-operative intubation/ventilation  Informed Consent:   Plan Discussed with: CRNA, Anesthesiologist and Surgeon  Anesthesia Plan Comments:        Anesthesia Quick Evaluation

## 2014-06-27 NOTE — Anesthesia Procedure Notes (Signed)
Procedure Name: Intubation Date/Time: 06/27/2014 9:49 AM Performed by: Minerva EndsMIRARCHI, Kijuan Gallicchio M Pre-anesthesia Checklist: Patient identified, Patient being monitored, Timeout performed, Emergency Drugs available and Suction available Patient Re-evaluated:Patient Re-evaluated prior to inductionOxygen Delivery Method: Circle system utilized and Ambu bag Preoxygenation: Pre-oxygenation with 100% oxygen Ventilation: Mask ventilation without difficulty Laryngoscope Size: Glidescope Grade View: Grade I Tube type: Oral Tube size: 7.0 mm Number of attempts: 1 Airway Equipment and Method: Stylet Placement Confirmation: ETT inserted through vocal cords under direct vision,  positive ETCO2 and breath sounds checked- equal and bilateral Secured at: 22 cm Tube secured with: Tape Dental Injury: Teeth and Oropharynx as per pre-operative assessment  Comments: Intubation assisted and supervised by [place]- atraumatic- teeth and mouth as pre intubation- multiple attempts by ED prior to arrival- bloody AW

## 2014-06-27 NOTE — ED Notes (Signed)
Fast completed by Dr. Janee Mornhompson and was negative.
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
## 2014-06-27 NOTE — Progress Notes (Signed)
Pt admitted to room 413m02 post surgery. Dr. Jeral FruitBotero at bedside. Verbal order for portable head CT STAT. Will continue to monitor.

## 2014-06-27 NOTE — Progress Notes (Signed)
Warming blanket applied for temp of 94.1 F. Will continue to monitor.

## 2014-06-27 NOTE — Progress Notes (Signed)
2006 spoke with Dr. Jeral FruitBotero regarding head CT results. MD aware of results. Orders given to limit stimulation and continue sedation to meet RASS goal.  2208 spoke with Dr. Dwain SarnaWakefield regarding head CT results. Also notified MD patient BP soft. Orders given for 500 cc bolus NS and to increase maintenance fluids to 125. Orders given to continue to increase Neo as needed.

## 2014-06-27 NOTE — ED Notes (Signed)
Dr. Micheline Mazeocherty at bedside performing intubation. Pt is a difficult intubation Dr. Micheline Mazeocherty requested anesthesia be paged.
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
## 2014-06-27 NOTE — Transfer of Care (Signed)
Immediate Anesthesia Transfer of Care Note  Patient: Tiffany Stevens  Procedure(s) Performed: Procedure(s): ANTERIOR CERVICAL DECOMPRESSION/DISCECTOMY FUSION CERVICAL SIX-SEVEN (N/A)  Patient Location: ICU  Anesthesia Type:General  Level of Consciousness: sedated and Patient remains intubated per anesthesia plan  Airway & Oxygen Therapy: Patient remains intubated per anesthesia plan and Patient placed on Ventilator (see vital sign flow sheet for setting)  Post-op Assessment: Report given to RN and Post -op Vital signs reviewed and stable  Post vital signs: Reviewed and stable  Last Vitals:  Filed Vitals:   06/27/14 1150  BP: 62/41  Pulse: 80  Temp: 34.6 C  Resp: 18    Complications: No apparent anesthesia complications
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
## 2014-06-27 NOTE — Anesthesia Postprocedure Evaluation (Signed)
Anesthesia Post Note  Patient: Tiffany Stevens  Procedure(s) Performed: Procedure(s) (LRB): ANTERIOR CERVICAL DECOMPRESSION/DISCECTOMY FUSION CERVICAL SIX-SEVEN (N/A)  Anesthesia type: General  Patient location: ICU  Post pain: Pain level controlled  Post assessment: Post-op Vital signs reviewed  Last Vitals:  Filed Vitals:   06/27/14 1150  BP: 62/41  Pulse: 80  Temp: 34.6 C  Resp: 18    Post vital signs: stable  Level of consciousness: Patient remains intubated per anesthesia plan  Complications: No apparent anesthesia complications

## 2014-06-27 NOTE — Progress Notes (Signed)
No vent check completed as pt is heading to OR at this time. Pt take off ventilator and manually ventilated upon arrival to Neuro PACU area. Ventilator placed in 3M02 for pt's use upon completion in OR
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
## 2014-06-27 NOTE — Procedures (Signed)
Intubation Procedure Note Cydney Cathlean CowerS Pelissier [id number] Sep 04, 1955  Procedure: Intubation Indications: Airway protection and maintenance  Procedure Details Consent: Unable to obtain consent because of emergent medical necessity. Time Out: Verified patient identification, verified procedure, site/side was marked, verified correct patient position, special equipment/implants available, medications/allergies/relevent history reviewed, required imaging and test results available.  Performed  Maximum sterile technique was used including gloves, hand hygiene and mask.  MAC and 3    Evaluation Hemodynamic Status: BP stable throughout; O2 sats: stable throughout Patient's Current Condition: stable Complications: No apparent complications Patient did tolerate procedure well. Chest X-ray ordered to verify placement.  CXR: pending.   Sharene Skeansdkins, Juanice Warburton [facility] 06/27/2014
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
## 2014-06-28 ENCOUNTER — Inpatient Hospital Stay (HOSPITAL_COMMUNITY): Payer: 59

## 2014-06-28 ENCOUNTER — Encounter (HOSPITAL_COMMUNITY): Payer: Self-pay | Admitting: Neurosurgery

## 2014-06-28 LAB — PREPARE FRESH FROZEN PLASMA
UNIT DIVISION: 0
UNIT DIVISION: 0
Unit division: 0

## 2014-06-28 LAB — BLOOD GAS, ARTERIAL
Acid-base deficit: 7.5 mmol/L — ABNORMAL HIGH (ref 0.0–2.0)
Bicarbonate: 17.7 mEq/L — ABNORMAL LOW (ref 20.0–24.0)
Drawn by: 312761
FIO2: 0.4 %
LHR: 20 {breaths}/min
O2 SAT: 99.3 %
PATIENT TEMPERATURE: 98.6
PEEP/CPAP: 5 cmH2O
PH ART: 7.3 — AB (ref 7.350–7.450)
PO2 ART: 189 mmHg — AB (ref 80.0–100.0)
TCO2: 18.9 mmol/L (ref 0–100)
VT: 460 mL
pCO2 arterial: 37.2 mmHg (ref 35.0–45.0)

## 2014-06-28 LAB — CBC
HCT: 32.1 % — ABNORMAL LOW (ref 36.0–46.0)
HEMATOCRIT: 29.5 % — AB (ref 36.0–46.0)
HEMOGLOBIN: 11.1 g/dL — AB (ref 12.0–15.0)
HEMOGLOBIN: 10.1 g/dL — AB (ref 12.0–15.0)
MCH: 30.5 pg (ref 26.0–34.0)
MCH: 30.4 pg (ref 26.0–34.0)
MCHC: 34.6 g/dL (ref 30.0–36.0)
MCHC: 34.2 g/dL (ref 30.0–36.0)
MCV: 88.2 fL (ref 78.0–100.0)
MCV: 88.9 fL (ref 78.0–100.0)
PLATELETS: 99 10*3/uL — AB (ref 150–400)
PLATELETS: 76 10*3/uL — AB (ref 150–400)
RBC: 3.64 MIL/uL — ABNORMAL LOW (ref 3.87–5.11)
RBC: 3.32 MIL/uL — ABNORMAL LOW (ref 3.87–5.11)
RDW: 16.7 % — ABNORMAL HIGH (ref 11.5–15.5)
RDW: 16.9 % — AB (ref 11.5–15.5)
WBC: 9.5 10*3/uL (ref 4.0–10.5)
WBC: 8.5 10*3/uL (ref 4.0–10.5)

## 2014-06-28 LAB — COMPREHENSIVE METABOLIC PANEL
ALK PHOS: 28 U/L — AB (ref 38–126)
ALT: 66 U/L — AB (ref 14–54)
ANION GAP: 10 (ref 5–15)
AST: 96 U/L — AB (ref 15–41)
Albumin: 3.4 g/dL — ABNORMAL LOW (ref 3.5–5.0)
BUN: 5 mg/dL — ABNORMAL LOW (ref 6–20)
CALCIUM: 6.6 mg/dL — AB (ref 8.9–10.3)
CHLORIDE: 114 mmol/L — AB (ref 101–111)
CO2: 18 mmol/L — AB (ref 22–32)
Creatinine, Ser: 0.56 mg/dL (ref 0.44–1.00)
GLUCOSE: 168 mg/dL — AB (ref 65–99)
Potassium: 3.8 mmol/L (ref 3.5–5.1)
Sodium: 142 mmol/L (ref 135–145)
Total Bilirubin: 1 mg/dL (ref 0.3–1.2)
Total Protein: 5.2 g/dL — ABNORMAL LOW (ref 6.5–8.1)

## 2014-06-28 LAB — LACTIC ACID, PLASMA: Lactic Acid, Venous: 2.8 mmol/L (ref 0.5–2.0)

## 2014-06-28 MED ORDER — ALBUMIN HUMAN 5 % IV SOLN
25.0000 g | Freq: Once | INTRAVENOUS | Status: AC
  Administered 2014-06-28: 25 g via INTRAVENOUS
  Filled 2014-06-28 (×2): qty 500

## 2014-06-28 MED ORDER — SODIUM CHLORIDE 0.9 % IV BOLUS (SEPSIS)
500.0000 mL | Freq: Once | INTRAVENOUS | Status: AC
  Administered 2014-06-28: 500 mL via INTRAVENOUS

## 2014-06-28 MED ORDER — DOPAMINE-DEXTROSE 3.2-5 MG/ML-% IV SOLN
0.0000 ug/kg/min | INTRAVENOUS | Status: DC
  Administered 2014-06-28: 5 ug/kg/min via INTRAVENOUS
  Administered 2014-06-29: 6.985 ug/kg/min via INTRAVENOUS
  Filled 2014-06-28 (×2): qty 250

## 2014-06-28 MED ORDER — PHENYLEPHRINE HCL 10 MG/ML IJ SOLN
0.0000 ug/min | INTRAMUSCULAR | Status: DC
  Filled 2014-06-28: qty 4

## 2014-06-28 MED ORDER — PHENYLEPHRINE HCL 10 MG/ML IJ SOLN
0.0000 ug/min | INTRAMUSCULAR | Status: DC
  Administered 2014-06-28: 125 ug/min via INTRAVENOUS
  Filled 2014-06-28: qty 4

## 2014-06-28 MED ORDER — PIVOT 1.5 CAL PO LIQD
1000.0000 mL | ORAL | Status: DC
  Administered 2014-06-28: 1000 mL
  Filled 2014-06-28 (×3): qty 1000

## 2014-06-28 NOTE — Progress Notes (Signed)
Patient ID: 32Po S Zarzycki, female   DOB: 1955-06-16, 59 y.o.   MRN: [id number] Neuro stable. [place]

## 2014-06-28 NOTE — Progress Notes (Signed)
CRITICAL VALUE ALERT  Critical value received: lactic acid 2.8  Date of notification:  06/27/2013  Time of notification:  0912  Critical value read back: yes  Nurse who received alert: Londell MohKaty Gresham Caetano, RN  MD notified (1st page):  Dr. Lindie SpruceWyatt (Notified in person)  Time of first page:  n/a  MD notified (2nd page):  Time of second page:  Responding MD:  Dr. Lindie SpruceWyatt  Time MD responded:  [phone]

## 2014-06-28 NOTE — Progress Notes (Signed)
Patient ID: Tiffany Stevens, female   DOB: 25-Dec-1955, 59 y.o.   MRN: [id number] Ct head better. Able to move all 4 extremities and f/c.family aware
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
## 2014-06-28 NOTE — Procedures (Signed)
Central Venous Catheter Insertion Procedure Note Tiffany Stevens [id number] 05-22-1955  Procedure: Insertion of Central Venous Catheter Indications: Assessment of intravascular volume, Drug and/or fluid administration and Frequent blood sampling  Procedure Details Consent: Risks of procedure as well as the alternatives and risks of each were explained to the (patient/caregiver).  Consent for procedure obtained. Time Out: Verified patient identification, verified procedure, site/side was marked, verified correct patient position, special equipment/implants available, medications/allergies/relevent history reviewed, required imaging and test results available.  Performed  Maximum sterile technique was used including antiseptics, cap, gloves, gown, hand hygiene, mask and sheet. Skin prep: Chlorhexidine; local anesthetic administered A antimicrobial bonded/coated triple lumen catheter was placed in the right subclavian vein using the Seldinger technique.  Evaluation Blood flow good Complications: No apparent complications Patient did tolerate procedure well. Chest X-ray ordered to verify placement.  CXR: pending.  Tiffany Stevens 06/28/2014, 9:10 AM
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
## 2014-06-29 ENCOUNTER — Inpatient Hospital Stay (HOSPITAL_COMMUNITY): Payer: 59

## 2014-06-29 LAB — BLOOD GAS, ARTERIAL
Acid-base deficit: 3.3 mmol/L — ABNORMAL HIGH (ref 0.0–2.0)
Bicarbonate: 20.5 mEq/L (ref 20.0–24.0)
DRAWN BY: 43707
FIO2: 0.4 %
MECHVT: 460 mL
O2 Saturation: 99.5 %
PCO2 ART: 32.2 mmHg — AB (ref 35.0–45.0)
PEEP: 5 cmH2O
PH ART: 7.419 (ref 7.350–7.450)
Patient temperature: 98.6
RATE: 20 resp/min
TCO2: 21.5 mmol/L (ref 0–100)
pO2, Arterial: 182 mmHg — ABNORMAL HIGH (ref 80.0–100.0)

## 2014-06-29 LAB — CBC WITH DIFFERENTIAL/PLATELET
Basophils Absolute: 0 10*3/uL (ref 0.0–0.1)
Basophils Relative: 0 % (ref 0–1)
Eosinophils Absolute: 0.1 10*3/uL (ref 0.0–0.7)
Eosinophils Relative: 1 % (ref 0–5)
HEMATOCRIT: 28.1 % — AB (ref 36.0–46.0)
HEMOGLOBIN: 9.7 g/dL — AB (ref 12.0–15.0)
Lymphocytes Relative: 18 % (ref 12–46)
Lymphs Abs: 1.5 10*3/uL (ref 0.7–4.0)
MCH: 30.7 pg (ref 26.0–34.0)
MCHC: 34.5 g/dL (ref 30.0–36.0)
MCV: 88.9 fL (ref 78.0–100.0)
MONOS PCT: 8 % (ref 3–12)
Monocytes Absolute: 0.7 10*3/uL (ref 0.1–1.0)
Neutro Abs: 5.9 10*3/uL (ref 1.7–7.7)
Neutrophils Relative %: 73 % (ref 43–77)
Platelets: 76 10*3/uL — ABNORMAL LOW (ref 150–400)
RBC: 3.16 MIL/uL — ABNORMAL LOW (ref 3.87–5.11)
RDW: 16.9 % — ABNORMAL HIGH (ref 11.5–15.5)
WBC: 8.1 10*3/uL (ref 4.0–10.5)

## 2014-06-29 LAB — BASIC METABOLIC PANEL
ANION GAP: 6 (ref 5–15)
BUN: 5 mg/dL — AB (ref 6–20)
CALCIUM: 6.8 mg/dL — AB (ref 8.9–10.3)
CHLORIDE: 116 mmol/L — AB (ref 101–111)
CO2: 21 mmol/L — AB (ref 22–32)
Creatinine, Ser: 0.41 mg/dL — ABNORMAL LOW (ref 0.44–1.00)
GFR calc non Af Amer: >60 mL/min (ref >60)
Glucose, Bld: 115 mg/dL — ABNORMAL HIGH (ref 65–99)
Potassium: 3.1 mmol/L — ABNORMAL LOW (ref 3.5–5.1)
SODIUM: 143 mmol/L (ref 135–145)

## 2014-06-29 MED ORDER — ALBUMIN HUMAN 5 % IV SOLN
12.5000 g | Freq: Once | INTRAVENOUS | Status: AC
  Administered 2014-06-29: 12.5 g via INTRAVENOUS
  Filled 2014-06-29: qty 250

## 2014-06-29 MED ORDER — POTASSIUM CHLORIDE 10 MEQ/100ML IV SOLN
10.0000 meq | INTRAVENOUS | Status: AC
  Administered 2014-06-29 (×6): 10 meq via INTRAVENOUS
  Filled 2014-06-29 (×6): qty 100

## 2014-06-29 MED ORDER — ADULT MULTIVITAMIN LIQUID CH
5.0000 mL | Freq: Every day | ORAL | Status: DC
  Administered 2014-06-29 – 2014-07-10 (×11): 5 mL
  Filled 2014-06-29 (×12): qty 5

## 2014-06-29 MED ORDER — CALCIUM GLUCONATE 10 % IV SOLN
1.0000 g | Freq: Once | INTRAVENOUS | Status: AC
Start: 2014-06-29 — End: 2014-06-29
  Administered 2014-06-29: 1 g via INTRAVENOUS
  Filled 2014-06-29 (×2): qty 10

## 2014-06-29 MED ORDER — PANTOPRAZOLE SODIUM 40 MG PO PACK
40.0000 mg | PACK | Freq: Every day | ORAL | Status: DC
  Administered 2014-06-29 – 2014-07-10 (×11): 40 mg
  Filled 2014-06-29 (×12): qty 20

## 2014-06-29 MED ORDER — PIVOT 1.5 CAL PO LIQD
1000.0000 mL | ORAL | Status: DC
  Filled 2014-06-29 (×2): qty 1000
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
## 2014-06-29 NOTE — Progress Notes (Signed)
Patient ID: 10, female   DOB: 09-12-1955, 59 y.o.   MRN: [id number] I met with her husband, son, and other family members in the room and updated them on the plan of care. Georganna Skeans, MD, MPH, FACS Trauma: [phone] General Surgery: [phone]

## 2014-06-29 NOTE — Progress Notes (Signed)
Patient ID: 5, female   DOB: 02/25/1955, 59 y.o.   MRN: [id number] Stable. While off sedation she was able to move and f/c
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
## 2014-06-30 ENCOUNTER — Inpatient Hospital Stay (HOSPITAL_COMMUNITY): Payer: 59

## 2014-06-30 LAB — BASIC METABOLIC PANEL
ANION GAP: 6 (ref 5–15)
BUN: 5 mg/dL — ABNORMAL LOW (ref 6–20)
CO2: 22 mmol/L (ref 22–32)
CREATININE: 0.46 mg/dL (ref 0.44–1.00)
Calcium: 7.4 mg/dL — ABNORMAL LOW (ref 8.9–10.3)
Chloride: 114 mmol/L — ABNORMAL HIGH (ref 101–111)
GFR calc non Af Amer: >60 mL/min (ref >60)
Glucose, Bld: 141 mg/dL — ABNORMAL HIGH (ref 65–99)
Potassium: 3.5 mmol/L (ref 3.5–5.1)
SODIUM: 142 mmol/L (ref 135–145)

## 2014-06-30 LAB — CBC
HCT: 26.7 % — ABNORMAL LOW (ref 36.0–46.0)
HEMOGLOBIN: 9.1 g/dL — AB (ref 12.0–15.0)
MCH: 30.6 pg (ref 26.0–34.0)
MCHC: 34.1 g/dL (ref 30.0–36.0)
MCV: 89.9 fL (ref 78.0–100.0)
Platelets: 83 10*3/uL — ABNORMAL LOW (ref 150–400)
RBC: 2.97 MIL/uL — AB (ref 3.87–5.11)
RDW: 16.2 % — AB (ref 11.5–15.5)
WBC: 8.4 10*3/uL (ref 4.0–10.5)

## 2014-06-30 LAB — BLOOD GAS, ARTERIAL
Acid-base deficit: 2.6 mmol/L — ABNORMAL HIGH (ref 0.0–2.0)
Bicarbonate: 21.6 mEq/L (ref 20.0–24.0)
Drawn by: 437071
FIO2: 0.3 %
MECHVT: 460 mL
O2 Saturation: 98.6 %
PEEP: 5 cmH2O
PH ART: 7.389 (ref 7.350–7.450)
PO2 ART: 124 mmHg — AB (ref 80.0–100.0)
Patient temperature: 98.6
RATE: 18 resp/min
TCO2: 22.7 mmol/L (ref 0–100)
pCO2 arterial: 36.6 mmHg (ref 35.0–45.0)

## 2014-06-30 LAB — LACTIC ACID, PLASMA: LACTIC ACID, VENOUS: 0.6 mmol/L (ref 0.5–2.0)

## 2014-06-30 MED ORDER — CALCIUM GLUCONATE 10 % IV SOLN
1.0000 g | Freq: Once | INTRAVENOUS | Status: AC
  Administered 2014-06-30: 1 g via INTRAVENOUS
  Filled 2014-06-30: qty 10

## 2014-06-30 MED ORDER — LIDOCAINE-EPINEPHRINE (PF) 2 %-1:200000 IJ SOLN
20.0000 mL | Freq: Once | INTRAMUSCULAR | Status: AC
  Administered 2014-06-30: 20 mL
  Filled 2014-06-30 (×2): qty 20

## 2014-06-30 MED ORDER — POTASSIUM CHLORIDE 10 MEQ/100ML IV SOLN
10.0000 meq | INTRAVENOUS | Status: AC
  Administered 2014-06-30 (×3): 10 meq via INTRAVENOUS
  Filled 2014-06-30 (×4): qty 100

## 2014-06-30 MED ORDER — PIVOT 1.5 CAL PO LIQD
1000.0000 mL | ORAL | Status: DC
  Administered 2014-06-30 – 2014-07-06 (×7): 1000 mL
  Filled 2014-06-30 (×8): qty 1000

## 2014-06-30 MED ORDER — MIDAZOLAM HCL 2 MG/2ML IJ SOLN
2.0000 mg | INTRAMUSCULAR | Status: DC | PRN
  Administered 2014-06-30: 2 mg via INTRAVENOUS
  Administered 2014-07-01 (×2): 1 mg via INTRAVENOUS
  Administered 2014-07-02 – 2014-07-09 (×10): 2 mg via INTRAVENOUS
  Filled 2014-06-30 (×13): qty 2

## 2014-06-30 MED ORDER — PRO-STAT SUGAR FREE PO LIQD
30.0000 mL | Freq: Three times a day (TID) | ORAL | Status: DC
  Administered 2014-06-30: 30 mL
  Filled 2014-06-30 (×3): qty 30

## 2014-06-30 MED ORDER — PRO-STAT SUGAR FREE PO LIQD
30.0000 mL | Freq: Every day | ORAL | Status: DC
  Administered 2014-07-01 – 2014-07-06 (×4): 30 mL
  Filled 2014-06-30 (×7): qty 30
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
## 2014-06-30 NOTE — Procedures (Signed)
Chest Tube Insertion Procedure Note  Indications:  Clinically significant Hemothorax  Pre-operative Diagnosis: Hemothorax  Post-operative Diagnosis: Hemothorax  Procedure Details  Informed consent was obtained for the procedure, including sedation.  Risks of lung perforation, hemorrhage, arrhythmia, and adverse drug reaction were discussed.   After sterile skin prep, using standard technique, a 32 French tube was placed in the left lateral 6 rib space.  Findings: 500 ml of bloody fluid obtained  Estimated Blood Loss:  500         Specimens:  None              Complications:  None; patient tolerated the procedure well.         Disposition: ICU - intubated and hemodynamically stable.         Condition: stable  Attending Attestation: I performed the procedure.  CXR pending.

## 2014-06-30 NOTE — Progress Notes (Signed)
Patient ID: 41, female   DOB: 26-Jan-1955, 59 y.o.   MRN: [id number] Stable, f/c. Moves all 4 extremities. Drain out

## 2014-06-30 NOTE — Progress Notes (Signed)
Incidental finding of large left pleural effusion on CT neck.  Will require chest tube placement for drainage.  Explained to the family.  Marta Lamas. Gae Bon, MD, FACS [phone] Trauma Surgeon
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
## 2014-07-01 ENCOUNTER — Inpatient Hospital Stay (HOSPITAL_COMMUNITY): Payer: 59

## 2014-07-01 DIAGNOSIS — D696 Thrombocytopenia, unspecified: Secondary | ICD-10-CM | POA: Diagnosis not present

## 2014-07-01 DIAGNOSIS — S12100A Unspecified displaced fracture of second cervical vertebra, initial encounter for closed fracture: Secondary | ICD-10-CM | POA: Diagnosis present

## 2014-07-01 DIAGNOSIS — R578 Other shock: Secondary | ICD-10-CM | POA: Diagnosis not present

## 2014-07-01 DIAGNOSIS — S272XXA Traumatic hemopneumothorax, initial encounter: Secondary | ICD-10-CM | POA: Diagnosis present

## 2014-07-01 DIAGNOSIS — S2242XA Multiple fractures of ribs, left side, initial encounter for closed fracture: Secondary | ICD-10-CM | POA: Diagnosis present

## 2014-07-01 DIAGNOSIS — D62 Acute posthemorrhagic anemia: Secondary | ICD-10-CM | POA: Diagnosis not present

## 2014-07-01 DIAGNOSIS — S27329A Contusion of lung, unspecified, initial encounter: Secondary | ICD-10-CM | POA: Diagnosis present

## 2014-07-01 DIAGNOSIS — S069X4A Unspecified intracranial injury with loss of consciousness 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, initial encounter: Secondary | ICD-10-CM | POA: Diagnosis present

## 2014-07-01 DIAGNOSIS — S0292XA Unspecified fracture of facial bones, initial encounter for closed fracture: Secondary | ICD-10-CM | POA: Diagnosis present

## 2014-07-01 DIAGNOSIS — J9600 Acute respiratory failure, unspecified whether with hypoxia or hypercapnia: Secondary | ICD-10-CM | POA: Diagnosis present

## 2014-07-01 LAB — BASIC METABOLIC PANEL
ANION GAP: 4 — AB (ref 5–15)
BUN: 9 mg/dL (ref 6–20)
CALCIUM: 7.8 mg/dL — AB (ref 8.9–10.3)
CO2: 25 mmol/L (ref 22–32)
Chloride: 113 mmol/L — ABNORMAL HIGH (ref 101–111)
Creatinine, Ser: 0.32 mg/dL — ABNORMAL LOW (ref 0.44–1.00)
GFR calc Af Amer: >60 mL/min (ref >60)
GFR calc non Af Amer: >60 mL/min (ref >60)
GLUCOSE: 126 mg/dL — AB (ref 65–99)
POTASSIUM: 4.1 mmol/L (ref 3.5–5.1)
Sodium: 142 mmol/L (ref 135–145)

## 2014-07-01 LAB — CBC WITH DIFFERENTIAL/PLATELET
BASOS ABS: 0 10*3/uL (ref 0.0–0.1)
Basophils Relative: 0 % (ref 0–1)
EOS PCT: 1 % (ref 0–5)
Eosinophils Absolute: 0 10*3/uL (ref 0.0–0.7)
HEMATOCRIT: 26.6 % — AB (ref 36.0–46.0)
Hemoglobin: 8.9 g/dL — ABNORMAL LOW (ref 12.0–15.0)
Lymphocytes Relative: 10 % — ABNORMAL LOW (ref 12–46)
Lymphs Abs: 0.7 10*3/uL (ref 0.7–4.0)
MCH: 30.5 pg (ref 26.0–34.0)
MCHC: 33.5 g/dL (ref 30.0–36.0)
MCV: 91.1 fL (ref 78.0–100.0)
MONO ABS: 0.6 10*3/uL (ref 0.1–1.0)
Monocytes Relative: 8 % (ref 3–12)
NEUTROS PCT: 82 % — AB (ref 43–77)
Neutro Abs: 5.7 10*3/uL (ref 1.7–7.7)
Platelets: 95 10*3/uL — ABNORMAL LOW (ref 150–400)
RBC: 2.92 MIL/uL — AB (ref 3.87–5.11)
RDW: 15.8 % — ABNORMAL HIGH (ref 11.5–15.5)
WBC: 7 10*3/uL (ref 4.0–10.5)

## 2014-07-01 MED ORDER — ACETAMINOPHEN 325 MG PO TABS
650.0000 mg | ORAL_TABLET | ORAL | Status: DC | PRN
  Administered 2014-07-01 – 2014-07-08 (×4): 650 mg via ORAL
  Filled 2014-07-01 (×4): qty 2

## 2014-07-01 MED ORDER — ALBUMIN HUMAN 5 % IV SOLN
12.5000 g | Freq: Once | INTRAVENOUS | Status: AC
  Administered 2014-07-01: 12.5 g via INTRAVENOUS
  Filled 2014-07-01: qty 250

## 2014-07-01 MED ORDER — SODIUM CHLORIDE 0.9 % IV SOLN
1.0000 g | Freq: Once | INTRAVENOUS | Status: AC
  Administered 2014-07-01: 1 g via INTRAVENOUS
  Filled 2014-07-01: qty 10
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
## 2014-07-01 NOTE — Progress Notes (Signed)
Opens eyes and will follow commands. Remains intubated. Remains in cervical collar. Moves arms to command.

## 2014-07-01 NOTE — Progress Notes (Signed)
Patient ID: [id number], female   DOB: Jun 27, 1955, 59 y.o.   MRN: [id number] Doing well. f/c

## 2014-07-02 ENCOUNTER — Inpatient Hospital Stay (HOSPITAL_COMMUNITY): Payer: 59

## 2014-07-02 ENCOUNTER — Encounter (HOSPITAL_COMMUNITY): Payer: Self-pay | Admitting: Anesthesiology

## 2014-07-02 LAB — BASIC METABOLIC PANEL
Anion gap: 5 (ref 5–15)
BUN: 11 mg/dL (ref 6–20)
CO2: 26 mmol/L (ref 22–32)
Calcium: 7.8 mg/dL — ABNORMAL LOW (ref 8.9–10.3)
Chloride: 105 mmol/L (ref 101–111)
GLUCOSE: 137 mg/dL — AB (ref 65–99)
Potassium: 4 mmol/L (ref 3.5–5.1)
Sodium: 136 mmol/L (ref 135–145)

## 2014-07-02 LAB — POCT I-STAT 3, ART BLOOD GAS (G3+)
ACID-BASE DEFICIT: 2 mmol/L (ref 0.0–2.0)
Bicarbonate: 23.5 mEq/L (ref 20.0–24.0)
O2 Saturation: 98 %
PO2 ART: 109 mmHg — AB (ref 80.0–100.0)
TCO2: 25 mmol/L (ref 0–100)
pCO2 arterial: 40.9 mmHg (ref 35.0–45.0)
pH, Arterial: 7.368 (ref 7.350–7.450)

## 2014-07-02 LAB — CBC
HEMATOCRIT: 27.2 % — AB (ref 36.0–46.0)
Hemoglobin: 9 g/dL — ABNORMAL LOW (ref 12.0–15.0)
MCH: 30.5 pg (ref 26.0–34.0)
MCHC: 33.1 g/dL (ref 30.0–36.0)
MCV: 92.2 fL (ref 78.0–100.0)
PLATELETS: 132 10*3/uL — AB (ref 150–400)
RBC: 2.95 MIL/uL — ABNORMAL LOW (ref 3.87–5.11)
RDW: 15.6 % — ABNORMAL HIGH (ref 11.5–15.5)
WBC: 9 10*3/uL (ref 4.0–10.5)

## 2014-07-02 MED ORDER — RACEPINEPHRINE HCL 2.25 % IN NEBU: 0.5000 mL | INHALATION_SOLUTION | Freq: Once | RESPIRATORY_TRACT | Status: DC

## 2014-07-02 MED ORDER — METHYLPREDNISOLONE SODIUM SUCC 125 MG IJ SOLR
60.0000 mg | Freq: Three times a day (TID) | INTRAMUSCULAR | Status: DC
  Administered 2014-07-02 – 2014-07-05 (×8): 60 mg via INTRAVENOUS
  Filled 2014-07-02: qty 0.96
  Filled 2014-07-02: qty 2
  Filled 2014-07-02 (×4): qty 0.96
  Filled 2014-07-02: qty 2
  Filled 2014-07-02: qty 0.96
  Filled 2014-07-02: qty 2
  Filled 2014-07-02 (×3): qty 0.96
  Filled 2014-07-02: qty 2

## 2014-07-02 MED ORDER — RACEPINEPHRINE HCL 2.25 % IN NEBU
0.5000 mL | INHALATION_SOLUTION | Freq: Once | RESPIRATORY_TRACT | Status: AC
  Administered 2014-07-02: 0.5 mL via RESPIRATORY_TRACT
  Filled 2014-07-02: qty 0.5

## 2014-07-02 MED ORDER — POLYETHYLENE GLYCOL 3350 17 G PO PACK
17.0000 g | PACK | Freq: Every day | ORAL | Status: DC
  Administered 2014-07-03 – 2014-07-10 (×6): 17 g via ORAL
  Filled 2014-07-02 (×11): qty 1

## 2014-07-02 NOTE — Addendum Note (Signed)
Addendum  created 07/02/14 1643 by Kipp Brood, MD   Modules edited: Notes Section   Notes Section:  File: [id number]; Pend: [id number]; Pend: [id number]; Beverely Low: [id number]; Pend: [id number]; File: [id number]; Pend: [id number]

## 2014-07-02 NOTE — Progress Notes (Signed)
Pt extubated by anaesthesia  Pt doing well but about 30 min-45 min later pt developed stridor  Racemic epi was given by me but pt did not improve.  Anaesthesia was call and pt was reintubated and placed back on vent

## 2014-07-02 NOTE — Progress Notes (Signed)
No change in neuro status. Remains an collar. Remains intubated. Following

## 2014-07-02 NOTE — Progress Notes (Signed)
Follow up - Trauma and Critical Care  Patient Details:    Tiffany Stevens is an 59 y.o. female.  Lines/tubes : Airway 7 mm (Active)  Secured at (cm) 24 cm 07/02/2014  7:45 AM  Measured From Lips 07/02/2014  7:45 AM  Secured Location Right 07/02/2014  7:45 AM  Secured By [employer] 07/02/2014  7:45 AM  Tube Holder Repositioned Yes 07/02/2014  7:45 AM  Cuff Pressure (cm H2O) 25 cm H2O 07/01/2014  7:05 PM  Site Condition Dry 07/02/2014  7:45 AM     CVC Triple Lumen 06/28/14 Right Subclavian (Active)  Indication for Insertion or Continuance of Line Prolonged intravenous therapies;Vasoactive infusions 07/02/2014  7:53 AM  Site Assessment Clean;Intact;Dry 07/02/2014  7:53 AM  Proximal Lumen Status Infusing;Flushed 07/02/2014  7:53 AM  Medial Infusing;Flushed 07/02/2014  7:53 AM  Distal Lumen Status Other (Comment) 07/02/2014  7:53 AM  Dressing Type Transparent;Non-occlusive 07/02/2014  7:53 AM  Dressing Status Clean;Dry;Intact;Antimicrobial disc in place 07/02/2014  7:53 AM  Line Care Connections checked and tightened;Line pulled back;Zeroed and calibrated;Leveled 07/02/2014  7:53 AM  Dressing Change Due 07/05/14 07/02/2014  7:53 AM     Arterial Line 06/27/14 Right Radial (Active)  Site Assessment Clean;Dry;Intact 07/02/2014  7:53 AM  Line Status Pulsatile blood flow 07/02/2014  7:53 AM  Art Line Waveform Appropriate 07/02/2014  7:53 AM  Art Line Interventions Zeroed and calibrated;Connections checked and tightened 07/02/2014  7:53 AM  Color/Movement/Sensation Capillary refill less than 3 sec 07/02/2014  7:53 AM  Dressing Type Transparent;Non-occlusive 07/02/2014  7:53 AM  Dressing Status Clean;Dry;Intact 07/02/2014  7:53 AM  Dressing Change Due 07/05/14 06/29/2014  8:00 PM     Chest Tube 1 Left Pleural (Active)  Suction To water seal 07/02/2014  6:00 AM  Chest Tube Air Leak None 07/02/2014  6:00 AM  Patency Intervention Tip/tilt 07/02/2014  6:00 AM  Drainage Description Serosanguineous;Sanguineous  07/02/2014  6:00 AM  Dressing Status Clean;Dry;Intact 07/02/2014  6:05 AM  Dressing Intervention New dressing 07/02/2014  6:05 AM  Site Assessment Clean;Dry 07/02/2014  6:00 AM  Surrounding Skin Dry;Intact 07/02/2014  6:00 AM  Output (mL) 30 mL 07/02/2014  6:00 AM     NG/OG Tube Orogastric 16 Fr. (Active)  Placement Verification Auscultation 07/02/2014  7:53 AM  Site Assessment Clean;Dry;Intact 07/02/2014  7:53 AM  Status Infusing tube feed 07/02/2014  7:53 AM  Amount of suction 25 mmHg 06/27/2014  9:44 AM  Drainage Appearance Tan 07/02/2014  7:53 AM  Gastric Residual 10 mL 07/02/2014  7:53 AM  Intake (mL) 40 mL 07/01/2014  4:00 PM  Output (mL) 5 mL 06/28/2014  8:00 AM     Urethral Catheter Annette Stable, EMT Latex (Active)  Indication for Insertion or Continuance of Catheter Unstable critical patients (first 24-48 hours) 07/01/2014  8:00 PM  Site Assessment Clean;Intact;Dry 07/01/2014  8:00 PM  Catheter Maintenance Bag below level of bladder;Catheter secured;Drainage bag/tubing not touching floor;Insertion date on drainage bag;No dependent loops;Seal intact 07/01/2014  8:00 PM  Collection Container Standard drainage bag 07/01/2014  8:00 PM  Securement Method Securing device (Describe) 07/01/2014  8:00 PM  Urinary Catheter Interventions Unclamped 07/01/2014  8:00 PM  Output (mL) 100 mL 07/02/2014  6:00 AM    Microbiology/Sepsis markers: Results for orders placed or performed during the hospital encounter of 06/27/14  MRSA PCR Screening     Status: None   Collection Time: 06/27/14  3:45 PM  Result Value Ref Range Status   MRSA by PCR NEGATIVE NEGATIVE  Final    Comment:        The GeneXpert MRSA Assay (FDA approved for NASAL specimens only), is one component of a comprehensive MRSA colonization surveillance program. It is not intended to diagnose MRSA infection nor to guide or monitor treatment for MRSA infections.     Anti-infectives:  Anti-infectives    None      Best Practice/Protocols:   VTE Prophylaxis: Mechanical GI Prophylaxis: Proton Pump Inhibitor Continous Sedation  Consults: Treatment Team:  Trauma Md, MD Hilda Lias, MD    Events:  Subjective:    Overnight Issues: Patient did not wean yesterday.  Has some secretions now as opposed to before.  Objective:  Vital signs for last 24 hours: Temp:  [99.5 F (37.5 C)-100.9 F (38.3 C)] 99.5 F (37.5 C) (06/12 0700) Pulse Rate:  [79-117] 79 (06/12 0700) Resp:  [18-21] 18 (06/12 0700) BP: (93-172)/(47-93) 97/54 mmHg (06/12 0700) SpO2:  [98 %-100 %] 100 % (06/12 0745) Arterial Line BP: (111-169)/(46-85) 126/56 mmHg (06/12 0700) FiO2 (%):  [30 %] 30 % (06/12 0745)  Hemodynamic parameters for last 24 hours: CVP:  [5 mmHg-11 mmHg] 8 mmHg  Intake/Output from previous day: 06/11 0701 - 06/12 0700 In: 4781.7 [I.V.:3651.7; NG/GT:1130] Out: 3225 [Urine:3115; Chest Tube:110]  Intake/Output this shift:    Vent settings for last 24 hours: Vent Mode:  [-] PRVC FiO2 (%):  [30 %] 30 % Set Rate:  [18 bmp] 18 bmp Vt Set:  [460 mL] 460 mL PEEP:  [5 cmH20] 5 cmH20 Plateau Pressure:  [13 cmH20-18 cmH20] 14 cmH20  Physical Exam:  General: alert, no respiratory distress and but appareantly did not wean well yesterday or this AM. Neuro: alert, oriented and nonfocal exam Resp: clear to auscultation bilaterally CVS: regular rate and rhythm, S1, S2 normal, no murmur, click, rub or gallop GI: soft, nontender, BS WNL, no r/g and tolerating tube feedings well. Extremities: no edema, no erythema, pulses WNL  Results for orders placed or performed during the hospital encounter of 06/27/14 (from the past 24 hour(s))  CBC     Status: Abnormal   Collection Time: 07/02/14  6:00 AM  Result Value Ref Range   WBC 9.0 4.0 - 10.5 K/uL   RBC 2.95 (L) 3.87 - 5.11 MIL/uL   Hemoglobin 9.0 (L) 12.0 - 15.0 g/dL   HCT 26.3 (L) 33.5 - 45.6 %   MCV 92.2 78.0 - 100.0 fL   MCH 30.5 26.0 - 34.0 pg   MCHC 33.1 30.0 - 36.0 g/dL   RDW  25.6 (H) 38.9 - 15.5 %   Platelets 132 (L) 150 - 400 K/uL  Basic metabolic panel     Status: Abnormal   Collection Time: 07/02/14  6:00 AM  Result Value Ref Range   Sodium 136 135 - 145 mmol/L   Potassium 4.0 3.5 - 5.1 mmol/L   Chloride 105 101 - 111 mmol/L   CO2 26 22 - 32 mmol/L   Glucose, Bld 137 (H) 65 - 99 mg/dL   BUN 11 6 - 20 mg/dL   Creatinine, Ser <3.73 (L) 0.44 - 1.00 mg/dL   Calcium 7.8 (L) 8.9 - 10.3 mg/dL   GFR calc non Af Amer NOT CALCULATED >60 mL/min   GFR calc Af Amer NOT CALCULATED >60 mL/min   Anion gap 5 5 - 15     Assessment/Plan:   NEURO  Altered Mental Status:  sedation and try to wean and wean sedation   Plan: Wean and try to  extubate  PULM  Atelectasis/collapse (focal and left side)   Plan: No ventilatory changes yet.  CARDIO  No issues   Plan: CPM  RENAL  Urine output has been good.   Plan: CPM  GI  No specific issues.   Plan: Tolerating tube feedings well.  ID  No known infectious sources.   Plan: CPM  HEME  Anemia anemia of critical illness)   Plan: NO blood needed currently.  ENDO No known issues   Plan: CPM  Global Issues  No attempts were made to wean the patient yesterday.  Apparently did not wean well this AM, but she has still been on a good bit of sedation.  Will send secretions for culture.    LOS: 5 days   Additional comments:I reviewed the patient's new clinical lab test results. cbc/bmet and I reviewed the patients new imaging test results. cxr  Critical Care Total Time*: 30 Minutes  Jaylea Plourde 07/02/2014  *Care during the described time interval was provided by me and/or other providers on the critical care team.  I have reviewed this patient's available data, including medical history, events of note, physical examination and test results as part of my evaluation.

## 2014-07-02 NOTE — Progress Notes (Signed)
Patient reintubated after failed attempt to extubate today.  Her tongue in certain spots has some necrosis anteriorly, seems possible secondary to pressure--i.e. Bite block, ETT, or OGT. Does not seem to be part of original injury.  Will have ENT reassess.  The patient husband would prefer to have Dr. Narda Bonds.  Marta Lamas. Gae Bon, MD, FACS [phone] Trauma Surgeon
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
## 2014-07-02 NOTE — Consult Note (Signed)
Anesthesiology Intubation Note:   30-45 minutes following extubation, Pt became increasingly restless with  labored respirations and air hunger. Racemic epi given without improvement. Decision made to reintubate for airway protection and ventilatory support.  O2 sat 95% on NRB mask   Topical anesthesia applied to upper airway with 4% lidicaine soaked cotton balls.  DL X 1 with video laryngoscope, tongue swollen, mucosa friable with easily bleeding. Vocal cord visualized with some difficulty due to edema and bleeding. 8.0 glottic suction tube inserted. Cuff torn with significant air leak. Neck kept in neutral position. Decision made to exchange ETT.  7.5 glottic suction ETT inserted by exchanging over eischman stylet using MAC 3 blade to lift tongue. Neck kept in  neutral position.  ETT OK with good cuff seal, (+) CO2 per detector, tube position confirmed with bronchoscope.  BS equal with coarse bronchial BS.   Only sedation given was 30 mg propofol following second intubation.   HR-95 BP 116/78 O2 sat 99% following intubation.   Family informed of events, questions answered.  Kipp Brood

## 2014-07-02 NOTE — Addendum Note (Signed)
Addendum  created 07/02/14 1654 by Atilano Ina, CRNA   Modules edited: Charges VN

## 2014-07-03 ENCOUNTER — Inpatient Hospital Stay (HOSPITAL_COMMUNITY): Payer: 59

## 2014-07-03 LAB — CBC WITH DIFFERENTIAL/PLATELET
Basophils Absolute: 0 10*3/uL (ref 0.0–0.1)
Basophils Relative: 0 % (ref 0–1)
Eosinophils Absolute: 0 10*3/uL (ref 0.0–0.7)
Eosinophils Relative: 0 % (ref 0–5)
HEMATOCRIT: 26.2 % — AB (ref 36.0–46.0)
Hemoglobin: 8.8 g/dL — ABNORMAL LOW (ref 12.0–15.0)
LYMPHS ABS: 0.5 10*3/uL — AB (ref 0.7–4.0)
LYMPHS PCT: 6 % — AB (ref 12–46)
MCH: 30.7 pg (ref 26.0–34.0)
MCHC: 33.6 g/dL (ref 30.0–36.0)
MCV: 91.3 fL (ref 78.0–100.0)
MONO ABS: 0.4 10*3/uL (ref 0.1–1.0)
Monocytes Relative: 5 % (ref 3–12)
NEUTROS PCT: 89 % — AB (ref 43–77)
Neutro Abs: 7.4 10*3/uL (ref 1.7–7.7)
Platelets: 146 10*3/uL — ABNORMAL LOW (ref 150–400)
RBC: 2.87 MIL/uL — AB (ref 3.87–5.11)
RDW: 15.2 % (ref 11.5–15.5)
WBC: 8.3 10*3/uL (ref 4.0–10.5)

## 2014-07-03 LAB — BASIC METABOLIC PANEL
ANION GAP: 6 (ref 5–15)
BUN: 13 mg/dL (ref 6–20)
CHLORIDE: 106 mmol/L (ref 101–111)
CO2: 26 mmol/L (ref 22–32)
Calcium: 7.9 mg/dL — ABNORMAL LOW (ref 8.9–10.3)
Glucose, Bld: 150 mg/dL — ABNORMAL HIGH (ref 65–99)
Potassium: 3.8 mmol/L (ref 3.5–5.1)
Sodium: 138 mmol/L (ref 135–145)

## 2014-07-03 MED ORDER — DOCUSATE SODIUM 50 MG/5ML PO LIQD
100.0000 mg | Freq: Two times a day (BID) | ORAL | Status: DC
  Administered 2014-07-03 – 2014-07-10 (×13): 100 mg
  Filled 2014-07-03 (×16): qty 10

## 2014-07-03 MED ORDER — BISACODYL 10 MG RE SUPP
10.0000 mg | Freq: Every day | RECTAL | Status: DC | PRN
  Administered 2014-07-04 – 2014-07-08 (×2): 10 mg via RECTAL
  Filled 2014-07-03 (×2): qty 1
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
## 2014-07-03 NOTE — Progress Notes (Signed)
Chaplain initiated follow up with pt and family. Chaplain introduced herself to pt and pt husband. Chaplain offered prayer at pt request. Pt husband asked chaplain to contact congregational nurse who is friends with pt. Chaplain will continue to follow.   07/03/14 1100  Clinical Encounter Type  Visited With Patient and family together  Visit Type Follow-up;Spiritual support  Referral From Chaplain  Spiritual Encounters  Spiritual Needs Emotional;Prayer  Stress Factors  Patient Stress Factors Health changes  Family Stress Factors Health changes  Mikka Kissner, Mayer Masker, Chaplain 07/03/2014 11:54 AM

## 2014-07-03 NOTE — Progress Notes (Signed)
SLP Cancellation Note  Patient Details Name: JOZI KREKE MRN: [id number] DOB: August 26, 1955   Cancelled treatment:    Remains intubated.  Will follow for readiness.       Blenda Mounts Laurice 07/03/2014, 11:34 AM
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
## 2014-07-04 ENCOUNTER — Inpatient Hospital Stay (HOSPITAL_COMMUNITY): Payer: 59

## 2014-07-04 ENCOUNTER — Encounter (HOSPITAL_COMMUNITY): Admission: EM | Disposition: A | Discharge status: Rehabilitation Facility | Payer: Self-pay | Source: Home / Self Care

## 2014-07-04 ENCOUNTER — Inpatient Hospital Stay (HOSPITAL_COMMUNITY): Payer: 59 | Admitting: Certified Registered Nurse Anesthetist

## 2014-07-04 HISTORY — PX: CLOSED REDUCTION NASAL FRACTURE: SHX5365

## 2014-07-04 HISTORY — PX: TRACHEOSTOMY TUBE PLACEMENT: SHX814

## 2014-07-04 LAB — BASIC METABOLIC PANEL
Anion gap: 7 (ref 5–15)
BUN: 17 mg/dL (ref 6–20)
CO2: 25 mmol/L (ref 22–32)
Calcium: 7.9 mg/dL — ABNORMAL LOW (ref 8.9–10.3)
Chloride: 104 mmol/L (ref 101–111)
Creatinine, Ser: <0.3 mg/dL — ABNORMAL LOW (ref 0.44–1.00)
Glucose, Bld: 167 mg/dL — ABNORMAL HIGH (ref 65–99)
Potassium: 3.8 mmol/L (ref 3.5–5.1)
SODIUM: 136 mmol/L (ref 135–145)

## 2014-07-04 LAB — CBC
HCT: 26.8 % — ABNORMAL LOW (ref 36.0–46.0)
Hemoglobin: 9 g/dL — ABNORMAL LOW (ref 12.0–15.0)
MCH: 30.6 pg (ref 26.0–34.0)
MCHC: 33.6 g/dL (ref 30.0–36.0)
MCV: 91.2 fL (ref 78.0–100.0)
PLATELETS: 217 10*3/uL (ref 150–400)
RBC: 2.94 MIL/uL — AB (ref 3.87–5.11)
RDW: 15.2 % (ref 11.5–15.5)
WBC: 10.7 10*3/uL — AB (ref 4.0–10.5)

## 2014-07-04 SURGERY — CREATION, TRACHEOSTOMY
Anesthesia: General | Site: Nose

## 2014-07-04 MED ORDER — CEFAZOLIN SODIUM-DEXTROSE 2-3 GM-% IV SOLR
INTRAVENOUS | Status: DC | PRN
  Administered 2014-07-04: 2 g via INTRAVENOUS

## 2014-07-04 MED ORDER — ROCURONIUM BROMIDE 50 MG/5ML IV SOLN
INTRAVENOUS | Status: AC
  Filled 2014-07-04: qty 1

## 2014-07-04 MED ORDER — BACITRACIN ZINC 500 UNIT/GM EX OINT
TOPICAL_OINTMENT | CUTANEOUS | Status: AC
  Filled 2014-07-04: qty 28.35

## 2014-07-04 MED ORDER — OXYMETAZOLINE HCL 0.05 % NA SOLN
NASAL | Status: AC
  Filled 2014-07-04: qty 15

## 2014-07-04 MED ORDER — PROPOFOL 10 MG/ML IV BOLUS
INTRAVENOUS | Status: AC
  Filled 2014-07-04: qty 20

## 2014-07-04 MED ORDER — MIDAZOLAM HCL 2 MG/2ML IJ SOLN
INTRAMUSCULAR | Status: AC
  Filled 2014-07-04: qty 2

## 2014-07-04 MED ORDER — FENTANYL CITRATE (PF) 250 MCG/5ML IJ SOLN
INTRAMUSCULAR | Status: AC
  Filled 2014-07-04: qty 5

## 2014-07-04 MED ORDER — BACITRACIN ZINC 500 UNIT/GM EX OINT
TOPICAL_OINTMENT | CUTANEOUS | Status: DC | PRN
  Administered 2014-07-04: 1 via TOPICAL

## 2014-07-04 MED ORDER — 0.9 % SODIUM CHLORIDE (POUR BTL) OPTIME
TOPICAL | Status: DC | PRN
  Administered 2014-07-04: 1000 mL

## 2014-07-04 MED ORDER — PHENYLEPHRINE HCL 10 MG/ML IJ SOLN
INTRAMUSCULAR | Status: DC | PRN
  Administered 2014-07-04: 80 ug via INTRAVENOUS

## 2014-07-04 MED ORDER — LIDOCAINE-EPINEPHRINE 1 %-1:100000 IJ SOLN
INTRAMUSCULAR | Status: AC
  Filled 2014-07-04: qty 1

## 2014-07-04 MED ORDER — LACTATED RINGERS IV SOLN
INTRAVENOUS | Status: DC | PRN
  Administered 2014-07-04: 13:00:00 via INTRAVENOUS

## 2014-07-04 MED ORDER — OXYMETAZOLINE HCL 0.05 % NA SOLN
NASAL | Status: DC | PRN
  Administered 2014-07-04: 1

## 2014-07-04 MED ORDER — ONDANSETRON HCL 4 MG/2ML IJ SOLN
INTRAMUSCULAR | Status: AC
  Filled 2014-07-04: qty 2

## 2014-07-04 MED ORDER — LIDOCAINE-EPINEPHRINE 1 %-1:100000 IJ SOLN
INTRAMUSCULAR | Status: DC | PRN
  Administered 2014-07-04: 4 mL

## 2014-07-04 MED ORDER — LORATADINE 10 MG PO TABS
10.0000 mg | ORAL_TABLET | Freq: Every day | ORAL | Status: DC | PRN
  Filled 2014-07-04: qty 1

## 2014-07-04 MED ORDER — LIDOCAINE HCL (CARDIAC) 20 MG/ML IV SOLN
INTRAVENOUS | Status: AC
  Filled 2014-07-04: qty 5

## 2014-07-04 SURGICAL SUPPLY — 58 items
APPLICATOR COTTON TIP 6IN STRL (MISCELLANEOUS) ×3 IMPLANT
ATTRACTOMAT 16X20 MAGNETIC DRP (DRAPES) ×3 IMPLANT
BENZOIN TINCTURE PRP APPL 2/3 (GAUZE/BANDAGES/DRESSINGS) ×3 IMPLANT
BLADE 10 SAFETY STRL DISP (BLADE) ×3 IMPLANT
BLADE SURG 15 STRL LF DISP TIS (BLADE) ×2 IMPLANT
BLADE SURG 15 STRL SS (BLADE) ×1
CANISTER SUCTION 2500CC (MISCELLANEOUS) ×3 IMPLANT
CLEANER TIP ELECTROSURG 2X2 (MISCELLANEOUS) ×3 IMPLANT
CLIP TI WIDE RED SMALL 24 (CLIP) ×3 IMPLANT
CONT SPEC 4OZ CLIKSEAL STRL BL (MISCELLANEOUS) IMPLANT
COVER MAYO STAND STRL (DRAPES) IMPLANT
COVER SURGICAL LIGHT HANDLE (MISCELLANEOUS) ×3 IMPLANT
COVER TABLE BACK 60X90 (DRAPES) ×3 IMPLANT
DECANTER SPIKE VIAL GLASS SM (MISCELLANEOUS) ×3 IMPLANT
DRAPE PROXIMA HALF (DRAPES) ×3 IMPLANT
DRESSING ADAPTIC 1/2  N-ADH (PACKING) ×3 IMPLANT
DRESSING TELFA 8X3 (GAUZE/BANDAGES/DRESSINGS) ×3 IMPLANT
DRSG TELFA 3X8 NADH (GAUZE/BANDAGES/DRESSINGS) ×3 IMPLANT
ELECT COATED BLADE 2.86 ST (ELECTRODE) ×3 IMPLANT
ELECT REM PT RETURN 9FT ADLT (ELECTROSURGICAL) ×3
ELECTRODE REM PT RTRN 9FT ADLT (ELECTROSURGICAL) ×2 IMPLANT
GAUZE SPONGE 4X4 12PLY STRL (GAUZE/BANDAGES/DRESSINGS) ×3 IMPLANT
GAUZE SPONGE 4X4 16PLY XRAY LF (GAUZE/BANDAGES/DRESSINGS) ×3 IMPLANT
GLOVE BIO SURGEON STRL SZ 6.5 (GLOVE) ×3 IMPLANT
GLOVE SS BIOGEL STRL SZ 7.5 (GLOVE) ×4 IMPLANT
GLOVE SUPERSENSE BIOGEL SZ 7.5 (GLOVE) ×2
GOWN STRL REUS W/ TWL LRG LVL3 (GOWN DISPOSABLE) ×4 IMPLANT
GOWN STRL REUS W/ TWL XL LVL3 (GOWN DISPOSABLE) ×2 IMPLANT
GOWN STRL REUS W/TWL LRG LVL3 (GOWN DISPOSABLE) ×2
GOWN STRL REUS W/TWL XL LVL3 (GOWN DISPOSABLE) ×1
HOLDER TRACH TUBE VELCRO 19.5 (MISCELLANEOUS) ×3 IMPLANT
KIT BASIN OR (CUSTOM PROCEDURE TRAY) ×3 IMPLANT
KIT ROOM TURNOVER OR (KITS) ×3 IMPLANT
KIT SUCTION CATH 14FR (SUCTIONS) ×3 IMPLANT
NEEDLE HYPO 25GX1X1/2 BEV (NEEDLE) IMPLANT
NEEDLE HYPO 25X1 1.5 SAFETY (NEEDLE) ×3 IMPLANT
NS IRRIG 1000ML POUR BTL (IV SOLUTION) ×3 IMPLANT
PACK EENT II TURBAN DRAPE (CUSTOM PROCEDURE TRAY) ×3 IMPLANT
PAD ARMBOARD 7.5X6 YLW CONV (MISCELLANEOUS) ×6 IMPLANT
PATTIES SURGICAL .5 X3 (DISPOSABLE) ×3 IMPLANT
PENCIL BUTTON HOLSTER BLD 10FT (ELECTRODE) ×3 IMPLANT
SPLINT NASAL THERMO PLAST (MISCELLANEOUS) ×3 IMPLANT
SPONGE DRAIN TRACH 4X4 STRL 2S (GAUZE/BANDAGES/DRESSINGS) ×3 IMPLANT
SPONGE INTESTINAL PEANUT (DISPOSABLE) ×3 IMPLANT
STRIP CLOSURE SKIN 1/2X4 (GAUZE/BANDAGES/DRESSINGS) ×3 IMPLANT
SUT BONE WAX W31G (SUTURE) ×3 IMPLANT
SUT SILK 2 0 FS (SUTURE) ×3 IMPLANT
SUT SILK 2 0 SH CR/8 (SUTURE) ×3 IMPLANT
SUT SILK 3 0 TIES 10X30 (SUTURE) ×3 IMPLANT
SYR 20CC LL (SYRINGE) ×3 IMPLANT
SYR CONTROL 10ML LL (SYRINGE) ×3 IMPLANT
TOWEL OR 17X24 6PK STRL BLUE (TOWEL DISPOSABLE) ×3 IMPLANT
TUBE CONNECTING 12X1/4 (SUCTIONS) IMPLANT
TUBE SALEM SUMP 14F W/ARV (TUBING) ×3 IMPLANT
TUBE TRACH SHILEY  6 DIST  CUF (TUBING) ×3 IMPLANT
TUBE TRACH SHILEY 10 DIST CUFF (TUBING) IMPLANT
TUBE TRACH SHILEY 8 DIST CUF (TUBING) IMPLANT
WATER STERILE IRR 1000ML POUR (IV SOLUTION) ×3 IMPLANT

## 2014-07-04 NOTE — Progress Notes (Signed)
PT Cancellation Note  Patient Details Name: Tiffany Stevens MRN: [id number] DOB: 1955-05-31   Cancelled Treatment:    Reason Eval/Treat Not Completed: Medical issues which prohibited therapy (sedation increase and pending surg today at noon)   Alayja Armas 07/04/2014, 8:38 AM Pager [phone]

## 2014-07-04 NOTE — OR Nursing (Signed)
C-Collar removed by Dr. Ezzard Standing at the beginning of the case and reapplied at the end of it.
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
## 2014-07-04 NOTE — Interval H&P Note (Signed)
History and Physical Interval Note:  07/04/2014 12:47 PM  Tiffany Stevens  has presented today for surgery, with the diagnosis of respiratory failure nasal fracture  The various methods of treatment have been discussed with the patient and family. After consideration of risks, benefits and other options for treatment, the patient has consented to  Procedure(s): TRACHEOSTOMY (N/A) CLOSED REDUCTION NASAL FRACTURE (N/A) as a surgical intervention .  The patient's history has been reviewed, patient examined, no change in status, stable for surgery.  I have reviewed the patient's chart and labs.  Questions were answered to the patient's satisfaction.     Sabir Charters

## 2014-07-04 NOTE — Transfer of Care (Signed)
Immediate Anesthesia Transfer of Care Note  Patient: Tiffany Stevens  Procedure(s) Performed: Procedure(s): TRACHEOSTOMY (N/A) CLOSED REDUCTION NASAL FRACTURE (N/A)  Patient Location: PACU and NICU  Anesthesia Type:General  Level of Consciousness: Patient remains intubated per anesthesia plan  Airway & Oxygen Therapy: Patient remains intubated per anesthesia plan and Patient placed on Ventilator (see vital sign flow sheet for setting)  Post-op Assessment: Report given to RN and Post -op Vital signs reviewed and stable  Post vital signs: Reviewed and stable  Last Vitals:  Filed Vitals:   07/04/14 1125  BP:   Pulse: 107  Temp:   Resp: 21    Complications: No apparent anesthesia complications

## 2014-07-04 NOTE — Brief Op Note (Signed)
06/27/2014 - 07/04/2014  3:52 PM  PATIENT:  Tiffany Stevens  60 y.o. female  PRE-OPERATIVE DIAGNOSIS:  respiratory failure with prolonged intubation, nasal fracture  POST-OPERATIVE DIAGNOSIS:  respiratory failure with prolonged intubation, nasal fracture  PROCEDURE:  Procedure(s): TRACHEOSTOMY (N/A) CLOSED REDUCTION NASAL FRACTURE (N/A)  SURGEON:  Surgeon(s) and Role:    * Drema Halon, MD - Primary  PHYSICIAN ASSISTANT:   ASSISTANTS: none   ANESTHESIA:   general  EBL:  Total I/O In: 1771.3 [I.V.:1771.3] Out: 925 [Urine:925]  BLOOD ADMINISTERED:none  DRAINS: none   LOCAL MEDICATIONS USED:  LIDOCAINE with EPI  5 cc  SPECIMEN:  No Specimen  DISPOSITION OF SPECIMEN:  N/A  COUNTS:  YES  TOURNIQUET:  * No tourniquets in log *  DICTATION: .Other Dictation: Dictation Number [id number]  PLAN OF CARE: Admit to inpatient   PATIENT DISPOSITION:  PACU - hemodynamically stable.   Delay start of Pharmacological VTE agent (>24hrs) due to surgical blood loss or risk of bleeding: yes
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
## 2014-07-04 NOTE — Progress Notes (Signed)
OT Cancellation Note  Patient Details Name: Tiffany Stevens MRN: [id number] DOB: March 14, 1955   Cancelled Treatment:    Reason Eval/Treat Not Completed: Medical issues which prohibited therapy (sedation increase and pending surg today at noon) OT hold evaluation pending reorder after surgery. Spoke with RN Josh regarding TBI consult and therapy will hold today pending procedures.   Harolyn Rutherford  Pager: [phone]  07/04/2014, 8:14 AM

## 2014-07-04 NOTE — Progress Notes (Signed)
Patient ID: 3, female   DOB: 1955-04-03, 59 y.o.   MRN: [id number] Had tracheostomy. Neuro stable . Spoke with family

## 2014-07-04 NOTE — Progress Notes (Signed)
Post Op check s/p trach and CRNF Stable Trach functioning well with minimal bleeding Nasal packing in place. Will plan on removing nasal packs in 2-3 days. Stable post op

## 2014-07-04 NOTE — Progress Notes (Signed)
Conversation with pt's husband, Ethelene Browns, this AM:  Husband is encouraged by patient's improvement, and calls her "a miracle."  He is hopeful that pt will improve enough to go to inpatient rehab eventually.    Husband gave me daughter's information, so that I may prepare a letter for her workplace, documenting her absence.  Will prepare for her ASAP.    Encouraged husband to contact Case Manager should any other needs arise.    Quintella Baton, RN, BSN  Trauma/Neuro ICU Case Manager [phone]

## 2014-07-04 NOTE — Anesthesia Preprocedure Evaluation (Addendum)
Anesthesia Evaluation  Patient identified by MRN, date of birth, ID band Patient awake    Reviewed: Allergy & Precautions, NPO status , Patient's Chart, lab work & pertinent test resultsPreop documentation limited or incomplete due to emergent nature of procedure.  Airway Mallampati: Intubated       Dental   Pulmonary    + decreased breath sounds      Cardiovascular Rhythm:Regular     Neuro/Psych    GI/Hepatic   Endo/Other    Renal/GU      Musculoskeletal   Abdominal   Peds  Hematology  (+) anemia ,   Anesthesia Other Findings   Reproductive/Obstetrics                            Anesthesia Physical Anesthesia Plan  ASA: III  Anesthesia Plan: General   Post-op Pain Management:    Induction: Inhalational  Airway Management Planned: Oral ETT  Additional Equipment: Arterial line  Intra-op Plan:   Post-operative Plan: Post-operative intubation/ventilation  Informed Consent:   Only emergency history available  Plan Discussed with: CRNA and Surgeon  Anesthesia Plan Comments:        Anesthesia Quick Evaluation

## 2014-07-04 NOTE — Progress Notes (Signed)
SLP Cancellation Note  Patient Details Name: LAKIN STATES MRN: [id number] DOB: [birth year]   Cancelled treatment:       Reason Eval/Treat Not Completed: Patient at procedure or test/unavailable   Tyqwan Pink, Riley Nearing 07/04/2014, 8:34 AM

## 2014-07-04 NOTE — Progress Notes (Signed)
Chaplain responded to request from pt husband that pt requested prayer before surgery. Chaplain gathered information about what pt wanted prayer for by asking yes or no questions. Pt is a woman of faith and spirituality is very important to her. Accd to pt husband, pt belongs to a [employer]. Chaplain was able to contact one of her church members yesterday, who is a Engineer, water and at Engineer, civil (consulting) at [employer]. Pt husband believes that being able to communicate in [place] will be helpful. Chaplain expressed that she is looking forward to speaking more with pt after extubation. Pt nodded and smiled. Chaplain will continue to follow.   07/04/14 1200  Clinical Encounter Type  Visited With Patient;Family  Visit Type Follow-up;Spiritual support  Referral From [facility]  Spiritual Encounters  Spiritual Needs Prayer  Stress Factors  Patient Stress Factors Health changes  Hollye Pritt, Loa Socks 07/04/2014 12:13 PM

## 2014-07-05 ENCOUNTER — Inpatient Hospital Stay (HOSPITAL_COMMUNITY): Payer: 59

## 2014-07-05 ENCOUNTER — Encounter (HOSPITAL_COMMUNITY): Payer: Self-pay | Admitting: Otolaryngology

## 2014-07-05 LAB — BASIC METABOLIC PANEL
Anion gap: 5 (ref 5–15)
BUN: 11 mg/dL (ref 6–20)
CO2: 27 mmol/L (ref 22–32)
Calcium: 7.9 mg/dL — ABNORMAL LOW (ref 8.9–10.3)
Chloride: 103 mmol/L (ref 101–111)
Creatinine, Ser: 0.31 mg/dL — ABNORMAL LOW (ref 0.44–1.00)
Glucose, Bld: 139 mg/dL — ABNORMAL HIGH (ref 65–99)
Potassium: 4 mmol/L (ref 3.5–5.1)
SODIUM: 135 mmol/L (ref 135–145)

## 2014-07-05 LAB — CBC
HEMATOCRIT: 26.3 % — AB (ref 36.0–46.0)
Hemoglobin: 8.8 g/dL — ABNORMAL LOW (ref 12.0–15.0)
MCH: 30.7 pg (ref 26.0–34.0)
MCHC: 33.5 g/dL (ref 30.0–36.0)
MCV: 91.6 fL (ref 78.0–100.0)
PLATELETS: 224 10*3/uL (ref 150–400)
RBC: 2.87 MIL/uL — ABNORMAL LOW (ref 3.87–5.11)
RDW: 15.3 % (ref 11.5–15.5)
WBC: 11.2 10*3/uL — AB (ref 4.0–10.5)

## 2014-07-05 LAB — CULTURE, RESPIRATORY W GRAM STAIN

## 2014-07-05 LAB — CULTURE, RESPIRATORY

## 2014-07-05 MED ORDER — SODIUM CHLORIDE 0.9 % IR SOLN
Freq: Three times a day (TID) | Status: DC
  Administered 2014-07-05 – 2014-07-10 (×15)
  Filled 2014-07-05 (×21): qty 500

## 2014-07-05 NOTE — Progress Notes (Signed)
Patient is off the vent and has been on a 35% trach collar for the duration of the day. Sat's are stable.
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
## 2014-07-05 NOTE — Progress Notes (Signed)
Rehab Admissions Coordinator Note:  Patient was screened by Jacqulyn Barresi L for appropriateness for an Inpatient Acute Rehab Consult.  At this time, we are recommending Inpatient Rehab consult.  Shantana Christon L 07/05/2014, 11:52 AM  I can be reached at [phone].

## 2014-07-05 NOTE — Progress Notes (Signed)
Patient ID: 1, female   DOB: 1955/06/09, 58 y.o.   MRN: [id number] Neuro stable. f/c

## 2014-07-05 NOTE — Anesthesia Postprocedure Evaluation (Signed)
  Anesthesia Post-op Note  Patient: Tiffany Stevens  Procedure(s) Performed: Procedure(s): TRACHEOSTOMY (N/A) CLOSED REDUCTION NASAL FRACTURE (N/A)  Patient Location: ICU  Anesthesia Type:General  Level of Consciousness: sedated  Airway and Oxygen Therapy: tracheostomy  Post-op Pain: none  Post-op Assessment: Post-op Vital signs reviewed, Patient's Cardiovascular Status Stable, Respiratory Function Stable, Patent Airway and Pain level controlled LLE Motor Response: Purposeful movement   RLE Motor Response: Purposeful movement        Post-op Vital Signs: Reviewed and stable  Last Vitals:  Filed Vitals:   07/05/14 1400  BP: 133/77  Pulse: 98  Temp:   Resp: 19    Complications: No apparent anesthesia complications

## 2014-07-05 NOTE — Progress Notes (Signed)
Follow up - Trauma and Critical Care  Patient Details:    Tiffany Stevens is an 59 y.o. female.  Lines/tubes : CVC Triple Lumen 06/28/14 Right Subclavian (Active)  Indication for Insertion or Continuance of Line Prolonged intravenous therapies 07/04/2014  8:00 PM  Site Assessment Clean;Dry;Intact 07/04/2014  8:00 PM  Proximal Lumen Status Infusing 07/04/2014  8:00 PM  Medial Infusing 07/04/2014  8:00 PM  Distal Lumen Status Other (Comment) 07/04/2014  4:00 PM  Dressing Type Transparent;Occlusive 07/04/2014  8:00 PM  Dressing Status Clean;Dry;Intact;Antimicrobial disc in place 07/04/2014  8:00 PM  Line Care Connections checked and tightened;Leveled;Zeroed and calibrated 07/04/2014  8:00 PM  Dressing Intervention New dressing;Dressing changed;Antimicrobial disc changed 07/05/2014  6:00 AM  Dressing Change Due 07/12/14 07/05/2014  6:00 AM     Arterial Line 06/27/14 Right Radial (Active)  Site Assessment Clean;Dry;Intact 07/04/2014  8:00 PM  Line Status Pulsatile blood flow 07/04/2014  8:00 PM  Art Line Waveform Whip 07/04/2014  8:00 PM  Art Line Interventions Zeroed and calibrated;Leveled;Connections checked and tightened;Flushed per protocol 07/04/2014  8:00 PM  Color/Movement/Sensation Capillary refill less than 3 sec 07/04/2014  8:00 PM  Dressing Type Transparent 07/04/2014  8:00 PM  Dressing Status Clean;Dry;Intact 07/04/2014  8:00 PM  Dressing Change Due 07/05/14 06/29/2014  8:00 PM     NG/OG Tube Orogastric 16 Fr. (Active)  Placement Verification Xray 07/04/2014 10:00 PM  Site Assessment Clean;Dry;Intact 07/04/2014  8:00 PM  Status Clamped 07/04/2014  8:00 PM  Amount of suction 25 mmHg 06/27/2014  9:44 AM  Drainage Appearance Yellow;Brown 07/04/2014  4:00 PM  Gastric Residual 10 mL 07/05/2014  4:00 AM  Intake (mL) 40 mL 07/01/2014  4:00 PM  Output (mL) 5 mL 06/28/2014  8:00 AM     Urethral Catheter Bernette Redbird Large, EMT Latex (Active)  Indication for Insertion or Continuance of Catheter Unstable critical  patients (first 24-48 hours);Unstable spinal/crush injuries 07/04/2014  8:00 PM  Site Assessment Clean;Intact 07/04/2014  8:00 PM  Catheter Maintenance Bag below level of bladder;Catheter secured;No dependent loops;Drainage bag/tubing not touching floor;Seal intact 07/04/2014  8:00 PM  Collection Container Standard drainage bag 07/04/2014  8:00 PM  Securement Method Securing device (Describe) 07/04/2014  8:00 PM  Urinary Catheter Interventions Unclamped 07/04/2014  8:00 PM  Output (mL) 350 mL 07/05/2014  5:00 AM    Microbiology/Sepsis markers: Results for orders placed or performed during the hospital encounter of 06/27/14  MRSA PCR Screening     Status: None   Collection Time: 06/27/14  3:45 PM  Result Value Ref Range Status   MRSA by PCR NEGATIVE NEGATIVE Final    Comment:        The GeneXpert MRSA Assay (FDA approved for NASAL specimens only), is one component of a comprehensive MRSA colonization surveillance program. It is not intended to diagnose MRSA infection nor to guide or monitor treatment for MRSA infections.   Culture, respiratory (NON-Expectorated)     Status: None   Collection Time: 07/02/14  8:29 AM  Result Value Ref Range Status   Specimen Description TRACHEAL ASPIRATE  Final   Special Requests NONE  Final   Gram Stain   Final    ABUNDANT WBC PRESENT,BOTH PMN AND MONONUCLEAR NO SQUAMOUS EPITHELIAL CELLS SEEN FEW GRAM POSITIVE COCCI IN PAIRS RARE GRAM NEGATIVE RODS Performed at [facility]    Culture   Final    ABUNDANT ENTEROBACTER CLOACAE Performed at [facility]    Report Status 07/05/2014 FINAL  Final  Organism ID, Bacteria ENTEROBACTER CLOACAE  Final      Susceptibility   Enterobacter cloacae - MIC*    CEFAZOLIN >=64 RESISTANT Resistant     CEFEPIME <=1 SENSITIVE Sensitive     CEFTAZIDIME <=1 SENSITIVE Sensitive     CEFTRIAXONE <=1 SENSITIVE Sensitive     CIPROFLOXACIN <=0.25 SENSITIVE Sensitive     GENTAMICIN <=1 SENSITIVE Sensitive      IMIPENEM <=0.25 SENSITIVE Sensitive     PIP/TAZO <=4 SENSITIVE Sensitive     TOBRAMYCIN <=1 SENSITIVE Sensitive     TRIMETH/SULFA <=20 SENSITIVE Sensitive     * ABUNDANT ENTEROBACTER CLOACAE    Anti-infectives:  Anti-infectives    None      Best Practice/Protocols:  VTE Prophylaxis: Mechanical GI Prophylaxis: Proton Pump Inhibitor Continous Sedation  Consults: Treatment Team:  Trauma Md, MD Hilda Lias, MD    Events:  Subjective:    Overnight Issues: Fentanyl sedation, still alert.  Objective:  Vital signs for last 24 hours: Temp:  [98.2 F (36.8 C)-99.7 F (37.6 C)] 99 F (37.2 C) (06/15 0800) Pulse Rate:  [78-129] 102 (06/15 0800) Resp:  [18-32] 29 (06/15 0800) BP: (109-169)/(61-100) 143/96 mmHg (06/15 0800) SpO2:  [93 %-100 %] 100 % (06/15 0800) Arterial Line BP: (83-296)/(27-107) 145/102 mmHg (06/15 0800) FiO2 (%):  [30 %] 30 % (06/15 0800)  Hemodynamic parameters for last 24 hours: CVP:  [2 mmHg-10 mmHg] 10 mmHg  Intake/Output from previous day: 06/14 0701 - 06/15 0700 In: 4020.7 [I.V.:3904.5; NG/GT:116.2] Out: 3350 [Urine:3350]  Intake/Output this shift: Total I/O In: 155 [I.V.:135; NG/GT:20] Out: -   Vent settings for last 24 hours: Vent Mode:  [-] PRVC FiO2 (%):  [30 %] 30 % Set Rate:  [18 bmp] 18 bmp Vt Set:  [460 mL] 460 mL PEEP:  [5 cmH20] 5 cmH20 Plateau Pressure:  [18 cmH20-20 cmH20] 18 cmH20  Physical Exam:  General: alert and no respiratory distress Neuro: alert, oriented and nonfocal exam HEENT/Neck: Lingual necrosis with tissue loss. Resp: clear to auscultation bilaterally GI: soft, nontender, BS WNL, no r/g and tolerating tube feedings well. Extremities: no edema, no erythema, pulses WNL  Results for orders placed or performed during the hospital encounter of 06/27/14 (from the past 24 hour(s))  CBC     Status: Abnormal   Collection Time: 07/05/14  5:00 AM  Result Value Ref Range   WBC 11.2 (H) 4.0 - 10.5 K/uL   RBC  2.87 (L) 3.87 - 5.11 MIL/uL   Hemoglobin 8.8 (L) 12.0 - 15.0 g/dL   HCT 16.1 (L) 09.6 - 04.5 %   MCV 91.6 78.0 - 100.0 fL   MCH 30.7 26.0 - 34.0 pg   MCHC 33.5 30.0 - 36.0 g/dL   RDW 40.9 81.1 - 91.4 %   Platelets 224 150 - 400 K/uL  Basic metabolic panel     Status: Abnormal   Collection Time: 07/05/14  5:00 AM  Result Value Ref Range   Sodium 135 135 - 145 mmol/L   Potassium 4.0 3.5 - 5.1 mmol/L   Chloride 103 101 - 111 mmol/L   CO2 27 22 - 32 mmol/L   Glucose, Bld 139 (H) 65 - 99 mg/dL   BUN 11 6 - 20 mg/dL   Creatinine, Ser 7.82 (L) 0.44 - 1.00 mg/dL   Calcium 7.9 (L) 8.9 - 10.3 mg/dL   GFR calc non Af Amer >60 >60 mL/min   GFR calc Af Amer >60 >60 mL/min   Anion gap 5 5 -  15     Assessment/Plan:   NEURO  Altered Mental Status:  sedation and not agitated   Plan: Wean sedation as tolerated  PULM  Atelectasis/collapse (focal and RLL but does not appear to be clinically significant)   Plan: Continue to provide pulmonary care.  CARDIO  No issues   Plan: CPM  RENAL  No issues   Plan: CPM  GI  No specific issues   Plan: CPM  ID  No known infectious sources   Plan: CPM  HEME  Anemia anemia of critical illness)   Plan: No blood for now  ENDO No specific issues   Plan: CPM  Global Issues  Significant lingual necrosis right lateral aspect of the tongue.  Could not be bluntly debrided.  Patches of necrosis on the tip of the tongue and left side also.  Foul smelling.  Wean from the ventilator to trach collar.    LOS: 8 days   Additional comments:I reviewed the patient's new clinical lab test results. cbc/bmet and I reviewed the patients new imaging test results. cxr  Critical Care Total Time*: 30 Minutes  Malik Paar 07/05/2014  *Care during the described time interval was provided by me and/or other providers on the critical care team.  I have reviewed this patient's available data, including medical history, events of note, physical examination and test results  as part of my evaluation.

## 2014-07-05 NOTE — Care Management Note (Signed)
Case Management Note  Patient Details  Name: Tiffany Stevens MRN: [id number] Date of Birth: 15-Jun-1955  Subjective/Objective:     Pt s/p tracheostomy and closed reduction of nasal fracture on 07/04/14.   Pt continues to make slow, steady progress.               Action/Plan: PT/OT recommending CIR prior to dc home, and husband is hopeful for this.  IP rehab liasion following.  Will follow progress.    Expected Discharge Date:                  Expected Discharge Plan:  IP Rehab Facility  In-House Referral:  Clinical Social Work  Discharge planning Services  CM Consult  Post Acute Care Choice:    Choice offered to:     DME Arranged:    DME Agency:     HH Arranged:    HH Agency:     Status of Service:  In process, will continue to follow  Medicare Important Message Given:    Date Medicare IM Given:    Medicare IM give by:    Date Additional Medicare IM Given:    Additional Medicare Important Message give by:     If discussed at Long Length of Stay Meetings, dates discussed:    Additional Comments:  Quintella Baton, RN, BSN  Trauma/Neuro ICU Case Manager [phone]
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
## 2014-07-05 NOTE — Progress Notes (Signed)
Chaplain followed up with pt. Pt reported that she needed to speak with nurse. Chaplain coordinated with nurse to meet pt needs. Chaplain will continue to follow.   07/05/14 1600  Clinical Encounter Type  Visited With Patient  Visit Type Follow-up;Spiritual support  Jiles Harold 07/05/2014 4:19 PM

## 2014-07-06 DIAGNOSIS — S069X4S Unspecified intracranial injury with loss of consciousness 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, sequela: Secondary | ICD-10-CM

## 2014-07-06 DIAGNOSIS — S0292XS Unspecified fracture of facial bones, sequela: Secondary | ICD-10-CM

## 2014-07-06 DIAGNOSIS — S12100S Unspecified displaced fracture of second cervical vertebra, sequela: Secondary | ICD-10-CM

## 2014-07-06 DIAGNOSIS — S2242XS Multiple fractures of ribs, left side, sequela: Secondary | ICD-10-CM

## 2014-07-06 DIAGNOSIS — S272XXS Traumatic hemopneumothorax, sequela: Secondary | ICD-10-CM

## 2014-07-06 LAB — CBC WITH DIFFERENTIAL/PLATELET
Basophils Absolute: 0 10*3/uL (ref 0.0–0.1)
Basophils Relative: 0 % (ref 0–1)
Eosinophils Absolute: 0.1 10*3/uL (ref 0.0–0.7)
Eosinophils Relative: 1 % (ref 0–5)
HCT: 30.1 % — ABNORMAL LOW (ref 36.0–46.0)
Hemoglobin: 9.9 g/dL — ABNORMAL LOW (ref 12.0–15.0)
LYMPHS ABS: 1 10*3/uL (ref 0.7–4.0)
Lymphocytes Relative: 7 % — ABNORMAL LOW (ref 12–46)
MCH: 30.5 pg (ref 26.0–34.0)
MCHC: 32.9 g/dL (ref 30.0–36.0)
MCV: 92.6 fL (ref 78.0–100.0)
Monocytes Absolute: 1 10*3/uL (ref 0.1–1.0)
Monocytes Relative: 7 % (ref 3–12)
NEUTROS PCT: 85 % — AB (ref 43–77)
Neutro Abs: 12.3 10*3/uL — ABNORMAL HIGH (ref 1.7–7.7)
Platelets: 272 10*3/uL (ref 150–400)
RBC: 3.25 MIL/uL — AB (ref 3.87–5.11)
RDW: 15.2 % (ref 11.5–15.5)
WBC: 14.4 10*3/uL — AB (ref 4.0–10.5)

## 2014-07-06 MED ORDER — CEFTRIAXONE SODIUM IN DEXTROSE 20 MG/ML IV SOLN
1.0000 g | INTRAVENOUS | Status: DC
  Administered 2014-07-06 – 2014-07-10 (×5): 1 g via INTRAVENOUS
  Filled 2014-07-06 (×5): qty 50

## 2014-07-06 MED ORDER — FENTANYL 25 MCG/HR TD PT72
50.0000 ug | MEDICATED_PATCH | TRANSDERMAL | Status: DC
  Administered 2014-07-06 – 2014-07-09 (×2): 50 ug via TRANSDERMAL
  Filled 2014-07-06 (×2): qty 1

## 2014-07-06 MED ORDER — PIVOT 1.5 CAL PO LIQD
1000.0000 mL | ORAL | Status: DC
  Administered 2014-07-07 – 2014-07-09 (×3): 1000 mL
  Filled 2014-07-06 (×6): qty 1000

## 2014-07-06 NOTE — Progress Notes (Signed)
Pt's pad saturated with sanguinous yellow tinged fluid on left side around site where prior chest tube was.  Lung sounds are a little more diminished on this left lower side as well.  Changed petroleum dressing as well as 4x4s to reinforce site.  Notified Dr. Lindie Spruce of this.  Will continue to monitor pt.
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
## 2014-07-06 NOTE — Progress Notes (Signed)
POD 2 Patient off vent on trach collar Nasal packing removed Speech therapy working with swallowing and speech. Tongue with less swelling but has raw patches that should heal without problems. Will follow. Can change to cuff less trach when cuff not needed.
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
## 2014-07-06 NOTE — Progress Notes (Signed)
Patient ID: [id number], female   DOB: [birth year], 59 y.o.   MRN: [id number] Neuro stable. Pt to help

## 2014-07-07 MED ORDER — HYDROCODONE-ACETAMINOPHEN 7.5-325 MG/15ML PO SOLN
15.0000 mL | ORAL | Status: DC | PRN
  Administered 2014-07-07 – 2014-07-10 (×8): 15 mL
  Filled 2014-07-07 (×8): qty 15

## 2014-07-07 MED ORDER — FENTANYL CITRATE (PF) 100 MCG/2ML IJ SOLN
25.0000 ug | INTRAMUSCULAR | Status: DC | PRN
  Administered 2014-07-07 – 2014-07-10 (×16): 50 ug via INTRAVENOUS
  Filled 2014-07-07 (×15): qty 2

## 2014-07-07 MED ORDER — FREE WATER
100.0000 mL | Freq: Three times a day (TID) | Status: DC
Start: 2014-07-07 — End: 2014-07-10
  Administered 2014-07-07 – 2014-07-10 (×11): 100 mL

## 2014-07-07 MED ORDER — FENTANYL CITRATE (PF) 100 MCG/2ML IJ SOLN
INTRAMUSCULAR | Status: AC
  Filled 2014-07-07: qty 2
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
## 2014-07-07 NOTE — Progress Notes (Signed)
Patient ID: 29, female   DOB: 11/14/1955, 59 y.o.   MRN: [id number] Stable, moves all 4 extremities. i will be out of town. My partners to help with her care
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
## 2014-07-07 NOTE — Progress Notes (Signed)
Rehab admissions - I met with pt and her dtr to share details about our rehab program. Further questions were answered and informational brochures were given. Pt seemed a bit drowsy during this discussion but did express that she was willing to come to CIR when able. Family/dtr are also in support of this. Pt's husband is a Marine scientist at [employer] [facility] and is working today per dtr. Dtr will share initial information about rehab with him later. Dtr provided baseline details for pt as pt is not able to speak at this time due to trach.  Pt has [employer] and I will now open her case with insurance.   I will check on pt's status on Monday and proceed accordingly.   Thanks.  Nanetta Batty, PT Rehabilitation Admissions Coordinator [phone]

## 2014-07-07 NOTE — Progress Notes (Signed)
Fentanyl gtt d/c'd. Wasted 58ml into sink with Harlan Stains RN.
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
## 2014-07-08 LAB — CBC
HCT: 30.6 % — ABNORMAL LOW (ref 36.0–46.0)
Hemoglobin: 10.1 g/dL — ABNORMAL LOW (ref 12.0–15.0)
MCH: 30.4 pg (ref 26.0–34.0)
MCHC: 33 g/dL (ref 30.0–36.0)
MCV: 92.2 fL (ref 78.0–100.0)
PLATELETS: 347 10*3/uL (ref 150–400)
RBC: 3.32 MIL/uL — ABNORMAL LOW (ref 3.87–5.11)
RDW: 15.5 % (ref 11.5–15.5)
WBC: 12.7 10*3/uL — ABNORMAL HIGH (ref 4.0–10.5)

## 2014-07-08 LAB — BASIC METABOLIC PANEL
Anion gap: 8 (ref 5–15)
BUN: 11 mg/dL (ref 6–20)
CHLORIDE: 100 mmol/L — AB (ref 101–111)
CO2: 28 mmol/L (ref 22–32)
Calcium: 8.5 mg/dL — ABNORMAL LOW (ref 8.9–10.3)
Creatinine, Ser: <0.3 mg/dL — ABNORMAL LOW (ref 0.44–1.00)
GLUCOSE: 144 mg/dL — AB (ref 65–99)
Potassium: 3.7 mmol/L (ref 3.5–5.1)
Sodium: 136 mmol/L (ref 135–145)

## 2014-07-08 MED ORDER — SIMETHICONE 40 MG/0.6ML PO SUSP
40.0000 mg | Freq: Four times a day (QID) | ORAL | Status: DC | PRN
  Filled 2014-07-08: qty 0.6

## 2014-07-08 MED ORDER — BETHANECHOL CHLORIDE 25 MG PO TABS
25.0000 mg | ORAL_TABLET | Freq: Three times a day (TID) | ORAL | Status: DC
  Administered 2014-07-08 – 2014-07-10 (×6): 25 mg via ORAL
  Filled 2014-07-08 (×8): qty 1

## 2014-07-08 MED ORDER — FLEET ENEMA 7-19 GM/118ML RE ENEM
1.0000 | ENEMA | Freq: Every day | RECTAL | Status: DC | PRN
  Filled 2014-07-08: qty 1
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
## 2014-07-08 NOTE — Progress Notes (Signed)
Pt c/o abd discomfort all day. Given suppository for possible constipation (LBM 3 days ago). Pt had two lg BMs. Cont to c/o abd discomfort. Pt has been voiding frequently. Bladder scan: 989. I&O per protocol: 950. Pt states abd feels better. Will cont to monitor.

## 2014-07-08 NOTE — Progress Notes (Signed)
Patient ID: 58, female   DOB: 1955/03/21, 59 y.o.   MRN: [id number] I met with her husband. Georganna Skeans, MD, MPH, FACS Trauma: [phone] General Surgery: [phone]
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
## 2014-07-09 LAB — BASIC METABOLIC PANEL
Anion gap: 7 (ref 5–15)
BUN: 14 mg/dL (ref 6–20)
CO2: 26 mmol/L (ref 22–32)
Calcium: 8.6 mg/dL — ABNORMAL LOW (ref 8.9–10.3)
Chloride: 102 mmol/L (ref 101–111)
Creatinine, Ser: <0.3 mg/dL — ABNORMAL LOW (ref 0.44–1.00)
Glucose, Bld: 167 mg/dL — ABNORMAL HIGH (ref 65–99)
Potassium: 3.9 mmol/L (ref 3.5–5.1)
Sodium: 135 mmol/L (ref 135–145)

## 2014-07-09 MED ORDER — WHITE PETROLATUM GEL
Status: AC
  Administered 2014-07-09: 0.2
  Filled 2014-07-09: qty 1
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
## 2014-07-09 NOTE — Progress Notes (Signed)
Patient ID: 47, female   DOB: 1955-02-21, 59 y.o.   MRN: [id number] Subjective:  The patient is alert and pleasant. She is in no apparent distress. I spoke with her daughter. He's had some headaches.  Objective: Vital signs in last 24 hours: Temp:  [97.8 F (36.6 C)-99.9 F (37.7 C)] 97.8 F (36.6 C) (06/19 0400) Pulse Rate:  [86-129] 87 (06/19 0700) Resp:  [18-42] 22 (06/19 0700) BP: (84-167)/(48-108) 87/48 mmHg (06/19 0700) SpO2:  [96 %-100 %] 100 % (06/19 0700) FiO2 (%):  [28 %] 28 % (06/19 0309)  Intake/Output from previous day: 06/18 0701 - 06/19 0700 In: 2385 [I.V.:900; PT/WS:5681; IV Piggyback:100] Out: 3215 [Urine:3215] Intake/Output this shift:    Physical exam the patient is alert and pleasant. Glasgow Coma Scale 11 trait. She is moving all 4 extremities.  Lab Results:  Recent Labs  07/08/14 0530  WBC 12.7*  HGB 10.1*  HCT 30.6*  PLT 347   BMET  Recent Labs  07/08/14 0530 07/09/14 0217  NA 136 135  K 3.7 3.9  CL 100* 102  CO2 28 26  GLUCOSE 144* 167*  BUN 11 14  CREATININE <0.30* <0.30*  CALCIUM 8.5* 8.6*    Studies/Results: No results found.  Assessment/Plan: C6-7 fracture subluxation: The patient appears to be healing well. She can be mobilized from a neurosurgical point of view.  Traumatic subarachnoid hemorrhage: We will continue observation of her headaches. I don't think we need to rescan her.  LOS: 12 days     Tiffany Stevens D 07/09/2014, 7:24 AM

## 2014-07-09 NOTE — Consult Note (Signed)
Changed trach from cuffed to cuffless #6 trach to see if she has any better breathing around the trach but she still has significant breathing difficulty and speech difficulty with trach corked. Still dependent on trach use. Will follow up next week and check out upper airway with FOL if needed.
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
## 2014-07-10 ENCOUNTER — Inpatient Hospital Stay (HOSPITAL_COMMUNITY): Payer: 59

## 2014-07-10 ENCOUNTER — Inpatient Hospital Stay (HOSPITAL_COMMUNITY)
Admission: AD | Admit: 2014-07-10 | Discharge: 2014-07-24 | DRG: 949 | Disposition: A | Discharge status: Home Health | Payer: 59 | Source: Intra-hospital | Attending: Physical Medicine & Rehabilitation | Admitting: Physical Medicine & Rehabilitation

## 2014-07-10 ENCOUNTER — Encounter (HOSPITAL_COMMUNITY): Payer: Self-pay | Admitting: Physical Medicine and Rehabilitation

## 2014-07-10 DIAGNOSIS — M79604 Pain in right leg: Secondary | ICD-10-CM | POA: Diagnosis not present

## 2014-07-10 DIAGNOSIS — S069XAA Unspecified intracranial injury with loss of consciousness status unknown, initial encounter: Secondary | ICD-10-CM

## 2014-07-10 DIAGNOSIS — M79609 Pain in unspecified limb: Secondary | ICD-10-CM | POA: Diagnosis not present

## 2014-07-10 DIAGNOSIS — S12600S Unspecified displaced fracture of seventh cervical vertebra, sequela: Secondary | ICD-10-CM | POA: Diagnosis not present

## 2014-07-10 DIAGNOSIS — S069X4A Unspecified intracranial injury with loss of consciousness 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, initial encounter: Secondary | ICD-10-CM | POA: Diagnosis present

## 2014-07-10 DIAGNOSIS — R739 Hyperglycemia, unspecified: Secondary | ICD-10-CM | POA: Diagnosis present

## 2014-07-10 DIAGNOSIS — R339 Retention of urine, unspecified: Secondary | ICD-10-CM | POA: Diagnosis not present

## 2014-07-10 DIAGNOSIS — S066X9D Traumatic subarachnoid hemorrhage with loss of consciousness of unspecified duration, subsequent encounter: Principal | ICD-10-CM

## 2014-07-10 DIAGNOSIS — R1314 Dysphagia, pharyngoesophageal phase: Secondary | ICD-10-CM | POA: Diagnosis present

## 2014-07-10 DIAGNOSIS — S12100S Unspecified displaced fracture of second cervical vertebra, sequela: Secondary | ICD-10-CM | POA: Diagnosis not present

## 2014-07-10 DIAGNOSIS — S42032D Displaced fracture of lateral end of left clavicle, subsequent encounter for fracture with routine healing: Secondary | ICD-10-CM

## 2014-07-10 DIAGNOSIS — K140 Glossitis: Secondary | ICD-10-CM | POA: Diagnosis not present

## 2014-07-10 DIAGNOSIS — S069X4S Unspecified intracranial injury with loss of consciousness 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, sequela: Secondary | ICD-10-CM | POA: Diagnosis not present

## 2014-07-10 DIAGNOSIS — S12100A Unspecified displaced fracture of second cervical vertebra, initial encounter for closed fracture: Secondary | ICD-10-CM | POA: Diagnosis present

## 2014-07-10 DIAGNOSIS — S069X9A Unspecified intracranial injury with loss of consciousness of unspecified duration, initial encounter: Secondary | ICD-10-CM

## 2014-07-10 DIAGNOSIS — S069X1S Unspecified intracranial injury with loss of consciousness of 30 minutes or less, sequela: Secondary | ICD-10-CM

## 2014-07-10 DIAGNOSIS — D62 Acute posthemorrhagic anemia: Secondary | ICD-10-CM | POA: Diagnosis present

## 2014-07-10 DIAGNOSIS — B952 Enterococcus as the cause of diseases classified elsewhere: Secondary | ICD-10-CM | POA: Diagnosis not present

## 2014-07-10 DIAGNOSIS — S14156D Other incomplete lesion at C6 level of cervical spinal cord, subsequent encounter: Secondary | ICD-10-CM

## 2014-07-10 DIAGNOSIS — S12121D Other nondisplaced dens fracture, subsequent encounter for fracture with routine healing: Secondary | ICD-10-CM

## 2014-07-10 DIAGNOSIS — Z4659 Encounter for fitting and adjustment of other gastrointestinal appliance and device: Secondary | ICD-10-CM

## 2014-07-10 DIAGNOSIS — R51 Headache: Secondary | ICD-10-CM | POA: Diagnosis present

## 2014-07-10 DIAGNOSIS — Z930 Tracheostomy status: Secondary | ICD-10-CM

## 2014-07-10 DIAGNOSIS — R1310 Dysphagia, unspecified: Secondary | ICD-10-CM | POA: Insufficient documentation

## 2014-07-10 DIAGNOSIS — E871 Hypo-osmolality and hyponatremia: Secondary | ICD-10-CM | POA: Diagnosis present

## 2014-07-10 DIAGNOSIS — K567 Ileus, unspecified: Secondary | ICD-10-CM | POA: Diagnosis present

## 2014-07-10 DIAGNOSIS — R52 Pain, unspecified: Secondary | ICD-10-CM

## 2014-07-10 DIAGNOSIS — S14157D Other incomplete lesion at C7 level of cervical spinal cord, subsequent encounter: Secondary | ICD-10-CM | POA: Diagnosis not present

## 2014-07-10 DIAGNOSIS — M79605 Pain in left leg: Secondary | ICD-10-CM | POA: Diagnosis present

## 2014-07-10 DIAGNOSIS — A499 Bacterial infection, unspecified: Secondary | ICD-10-CM | POA: Diagnosis present

## 2014-07-10 DIAGNOSIS — N390 Urinary tract infection, site not specified: Secondary | ICD-10-CM | POA: Diagnosis not present

## 2014-07-10 DIAGNOSIS — S12100G Unspecified displaced fracture of second cervical vertebra, subsequent encounter for fracture with delayed healing: Secondary | ICD-10-CM | POA: Diagnosis not present

## 2014-07-10 DIAGNOSIS — S12600A Unspecified displaced fracture of seventh cervical vertebra, initial encounter for closed fracture: Secondary | ICD-10-CM | POA: Diagnosis present

## 2014-07-10 DIAGNOSIS — J156 Pneumonia due to other aerobic Gram-negative bacteria: Secondary | ICD-10-CM | POA: Diagnosis present

## 2014-07-10 DIAGNOSIS — F09 Unspecified mental disorder due to known physiological condition: Secondary | ICD-10-CM

## 2014-07-10 DIAGNOSIS — S069X4D Unspecified intracranial injury with loss of consciousness 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, subsequent encounter: Secondary | ICD-10-CM | POA: Diagnosis not present

## 2014-07-10 LAB — URINALYSIS, ROUTINE W REFLEX MICROSCOPIC
BILIRUBIN URINE: NEGATIVE
Glucose, UA: NEGATIVE mg/dL
Ketones, ur: NEGATIVE mg/dL
NITRITE: NEGATIVE
Protein, ur: NEGATIVE mg/dL
SPECIFIC GRAVITY, URINE: 1.021 (ref 1.005–1.030)
UROBILINOGEN UA: 1 mg/dL (ref 0.0–1.0)
pH: 7.5 (ref 5.0–8.0)

## 2014-07-10 LAB — URINE MICROSCOPIC-ADD ON

## 2014-07-10 MED ORDER — SIMETHICONE 40 MG/0.6ML PO SUSP
40.0000 mg | Freq: Four times a day (QID) | ORAL | Status: DC | PRN
  Filled 2014-07-10: qty 0.6

## 2014-07-10 MED ORDER — ONDANSETRON HCL 4 MG/2ML IJ SOLN
4.0000 mg | Freq: Four times a day (QID) | INTRAMUSCULAR | Status: DC | PRN
  Administered 2014-07-16 – 2014-07-17 (×2): 4 mg via INTRAVENOUS
  Filled 2014-07-10 (×2): qty 2

## 2014-07-10 MED ORDER — PANTOPRAZOLE SODIUM 40 MG PO PACK
40.0000 mg | PACK | Freq: Every day | ORAL | Status: DC
  Administered 2014-07-11 – 2014-07-24 (×12): 40 mg
  Filled 2014-07-10 (×17): qty 20

## 2014-07-10 MED ORDER — HYDROCODONE-ACETAMINOPHEN 7.5-325 MG/15ML PO SOLN
15.0000 mL | ORAL | Status: DC | PRN
  Administered 2014-07-10 – 2014-07-24 (×40): 15 mL
  Filled 2014-07-10 (×43): qty 15

## 2014-07-10 MED ORDER — METHOCARBAMOL 500 MG PO TABS
500.0000 mg | ORAL_TABLET | Freq: Four times a day (QID) | ORAL | Status: DC | PRN
  Administered 2014-07-10 – 2014-07-17 (×13): 500 mg via ORAL
  Filled 2014-07-10 (×13): qty 1

## 2014-07-10 MED ORDER — DIPHENHYDRAMINE HCL 12.5 MG/5ML PO ELIX: 12.5000 mg | ORAL_SOLUTION | Freq: Four times a day (QID) | ORAL | Status: DC | PRN

## 2014-07-10 MED ORDER — LORATADINE 10 MG PO TABS
10.0000 mg | ORAL_TABLET | Freq: Every day | ORAL | Status: DC | PRN
  Filled 2014-07-10: qty 1

## 2014-07-10 MED ORDER — SODIUM CHLORIDE 0.9 % IR SOLN
Freq: Three times a day (TID) | Status: DC
  Administered 2014-07-11 – 2014-07-24 (×32)
  Filled 2014-07-10 (×5): qty 500

## 2014-07-10 MED ORDER — FLEET ENEMA 7-19 GM/118ML RE ENEM
1.0000 | ENEMA | Freq: Every day | RECTAL | Status: DC | PRN
  Filled 2014-07-10: qty 1

## 2014-07-10 MED ORDER — CHLORHEXIDINE GLUCONATE 0.12 % MT SOLN
15.0000 mL | Freq: Two times a day (BID) | OROMUCOSAL | Status: DC
  Administered 2014-07-10 – 2014-07-24 (×26): 15 mL via OROMUCOSAL
  Filled 2014-07-10 (×30): qty 15

## 2014-07-10 MED ORDER — ACETAMINOPHEN 325 MG PO TABS
325.0000 mg | ORAL_TABLET | ORAL | Status: DC | PRN
  Administered 2014-07-16: 650 mg
  Filled 2014-07-10 (×2): qty 2

## 2014-07-10 MED ORDER — BISACODYL 10 MG RE SUPP: 10.0000 mg | Freq: Every day | RECTAL | Status: DC | PRN

## 2014-07-10 MED ORDER — GUAIFENESIN-DM 100-10 MG/5ML PO SYRP
5.0000 mL | ORAL_SOLUTION | Freq: Four times a day (QID) | ORAL | Status: DC | PRN
  Administered 2014-07-18: 10 mL
  Administered 2014-07-19: 5 mL
  Filled 2014-07-10 (×3): qty 10

## 2014-07-10 MED ORDER — POLYETHYLENE GLYCOL 3350 17 G PO PACK
17.0000 g | PACK | Freq: Every day | ORAL | Status: DC
  Administered 2014-07-11 – 2014-07-13 (×3): 17 g
  Filled 2014-07-10 (×6): qty 1

## 2014-07-10 MED ORDER — TRAZODONE HCL 50 MG PO TABS
25.0000 mg | ORAL_TABLET | Freq: Every evening | ORAL | Status: DC | PRN
  Administered 2014-07-11 – 2014-07-13 (×3): 25 mg via ORAL
  Administered 2014-07-14 – 2014-07-18 (×5): 50 mg via ORAL
  Filled 2014-07-10 (×10): qty 1

## 2014-07-10 MED ORDER — CEFTRIAXONE SODIUM IN DEXTROSE 20 MG/ML IV SOLN
1.0000 g | INTRAVENOUS | Status: AC
  Administered 2014-07-11 – 2014-07-12 (×2): 1 g via INTRAVENOUS
  Filled 2014-07-10 (×2): qty 50

## 2014-07-10 MED ORDER — ONDANSETRON HCL 4 MG PO TABS: 4.0000 mg | ORAL_TABLET | Freq: Four times a day (QID) | ORAL | Status: DC | PRN

## 2014-07-10 MED ORDER — ALUM & MAG HYDROXIDE-SIMETH 200-200-20 MG/5ML PO SUSP: 30.0000 mL | ORAL | Status: DC | PRN

## 2014-07-10 MED ORDER — PIVOT 1.5 CAL PO LIQD
1000.0000 mL | ORAL | Status: DC
  Administered 2014-07-10: 1000 mL
  Filled 2014-07-10 (×4): qty 1000

## 2014-07-10 MED ORDER — BISACODYL 10 MG RE SUPP
10.0000 mg | Freq: Every day | RECTAL | Status: DC | PRN
  Administered 2014-07-15: 10 mg via RECTAL
  Filled 2014-07-10: qty 1

## 2014-07-10 MED ORDER — IPRATROPIUM-ALBUTEROL 0.5-2.5 (3) MG/3ML IN SOLN
3.0000 mL | Freq: Four times a day (QID) | RESPIRATORY_TRACT | Status: DC
  Administered 2014-07-10 – 2014-07-11 (×2): 3 mL via RESPIRATORY_TRACT
  Filled 2014-07-10 (×3): qty 3

## 2014-07-10 MED ORDER — FENTANYL 25 MCG/HR TD PT72
50.0000 ug | MEDICATED_PATCH | TRANSDERMAL | Status: DC
  Administered 2014-07-12 – 2014-07-24 (×5): 50 ug via TRANSDERMAL
  Filled 2014-07-10 (×5): qty 2

## 2014-07-10 MED ORDER — FREE WATER
200.0000 mL | Freq: Three times a day (TID) | Status: DC
  Administered 2014-07-10: 200 mL

## 2014-07-10 MED ORDER — CETYLPYRIDINIUM CHLORIDE 0.05 % MT LIQD
7.0000 mL | Freq: Four times a day (QID) | OROMUCOSAL | Status: DC
  Administered 2014-07-11 – 2014-07-24 (×34): 7 mL via OROMUCOSAL
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
## 2014-07-10 NOTE — Progress Notes (Signed)
Rehab admissions - I am following pt's case and met with pt and her husband this am. Further information was given about our rehab program and pt's husband is also in support of pursuing inpatient rehab. Pt seemed much brighter, was mouthing words and moving her extremities.   I have received insurance approval from [facility] for inpatient rehab and received medical clearance from trauma NP. We have an available bed and will admit to CIR later today. Per RN, pt is to have swallow study later today as well.  I informed pt/family of insurance approval and they are pleased with the plan. I updated Almyra Free, case Freight forwarder and Denyse Amass, Education officer, museum as well.  Please call me with any questions. Thanks.  Nanetta Batty, PT Rehabilitation Admissions Coordinator [phone]
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
## 2014-07-10 NOTE — Progress Notes (Signed)
Pt transferred from 3S01 to 4W08. Report given to 4W RT regarding trach, HHN tx's, and pt history.
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
## 2014-07-10 NOTE — Progress Notes (Signed)
Called report to [place], RN on [facility]. All personal belongings with pt. VSS, CCMD notified, monitor removed. Discharged to IP rehab.
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
## 2014-07-10 NOTE — Progress Notes (Signed)
Patient ID: [id number], female   DOB: 21-Mar-1955, 59 y.o.   MRN: [id number] Subjective:  The patient is alert and pleasant. She nods appropriately. I spoke with her brother.  Objective: Vital signs in last 24 hours: Temp:  [97.5 F (36.4 C)-99.5 F (37.5 C)] 99.2 F (37.3 C) (06/20 0326) Pulse Rate:  [75-132] 90 (06/20 0747) Resp:  [18-33] 18 (06/20 0747) BP: (94-131)/(50-87) 111/60 mmHg (06/20 0326) SpO2:  [92 %-100 %] 99 % (06/20 0747) FiO2 (%):  [28 %] 28 % (06/20 0747) Weight:  [52.9 kg (116 lb 10 oz)] 52.9 kg (116 lb 10 oz) (06/19 1822)  Intake/Output from previous day: 06/19 0701 - 06/20 0700 In: 2505 [I.V.:920; WU/JW:1191; IV Piggyback:50] Out: 2000 [Urine:2000] Intake/Output this shift:    Physical exam the patient is alert and pleasant. She is moving all 4 extremities well.  Lab Results:  Recent Labs  07/08/14 0530  WBC 12.7*  HGB 10.1*  HCT 30.6*  PLT 347   BMET  Recent Labs  07/08/14 0530 07/09/14 0217  NA 136 135  K 3.7 3.9  CL 100* 102  CO2 28 26  GLUCOSE 144* 167*  BUN 11 14  CREATININE <0.30* <0.30*  CALCIUM 8.5* 8.6*    Studies/Results: No results found.  Assessment/Plan: Cervical fracture: The patient is doing well. We will continue the cervical orthosis.  LOS: 13 days     Amadeo Coke D 07/10/2014, 7:59 AM

## 2014-07-10 NOTE — Interval H&P Note (Signed)
Tiffany Stevens was admitted today to Inpatient Rehabilitation with the diagnosis of TBI, cervical fractures.  The patient's history has been reviewed, patient examined, and there is no change in status.  Patient continues to be appropriate for intensive inpatient rehabilitation.  I have reviewed the patient's chart and labs.  Questions were answered to the patient's satisfaction. The PAPE has been reviewed and assessment remains appropriate.  SWARTZ,ZACHARY T 07/10/2014, 5:59 PM

## 2014-07-10 NOTE — Clinical Social Work Note (Signed)
Clinical Social Worker continuing to follow patient and family for support and discharge planning needs.  Patient has been accepted for transfer to inpatient rehab today.  Patient and family agreeable.  Clinical Social Worker will sign off for now as social work intervention is no longer needed. Please consult [place] again if new need arises.  Macario Golds, [place] [phone]

## 2014-07-10 NOTE — Care Management Note (Signed)
Case Management Note  Patient Details  Name: Tiffany Stevens MRN: [id number] Date of Birth: 1956/01/04  Subjective/Objective:   Pt for dc to [facility] IP [facility] today.                 Action/Plan:   Expected Discharge Date:   07/10/14               Expected Discharge Plan:  IP Rehab Facility  In-House Referral:  Clinical Social Work  Discharge planning Services  CM Consult  Post Acute Care Choice:    Choice offered to:     DME Arranged:    DME Agency:     HH Arranged:    HH Agency:     Status of Service:  Completed, signed off  Medicare Important Message Given:  No Date Medicare IM Given:    Medicare IM give by:    Date Additional Medicare IM Given:    Additional Medicare Important Message give by:     If discussed at Long Length of Stay Meetings, dates discussed:    Additional Comments:  Quintella Baton, RN, BSN  Trauma/Neuro ICU Case Manager [phone]

## 2014-07-10 NOTE — Progress Notes (Signed)
Speech Language Pathology Treatment: Tiffany Stevens Speaking valve  Patient Details Name: Tiffany Stevens MRN: [id number] DOB: April 19, 1955 Today's Date: 07/10/2014 Time: 1100-1115 SLP Time Calculation (min) (ACUTE ONLY): 15 min  Assessment / Plan / Recommendation Clinical Impression  Pt continues to demonstrate minimal redirection of air to the upper airway despite trach change to a 6 cuffless.  SLP placed PMSV for up to 45 second trials with pt report of discomfort and audible air trapping upon removal. Pt was able to phonate with hoarse wet vocal quality and make attempts to clear secretions with cues, though ineffective.  Pts husband reports that ENT today observed laryngeal edema and secretions. Did not attempt Tiffany Stevens trials given report of edema and no observable improvement in secretion management. Though pt can attempt MBS prior to PMSV tolerance, likely pt could benefit from more time prior to objective assessment. Recommend pt f/u with SLP on CIR for swallowing and PMSV use. PMSV with SLP only.     HPI     Pertinent Vitals    SLP Plan  Continue with current plan of care    Recommendations  f/u at CIR      Patient may use Passy-Muir Speech Valve: with SLP only       Plan: Continue with current plan of care    GO    [facility], [place] CCC-SLP [phone]  Tiffany Stevens 07/10/2014, 12:55 PM
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
## 2014-07-10 NOTE — Progress Notes (Signed)
Patient arrived from 3S with RN, family and belongings. Patient and family oriented to room, rehab process, rehab schedule, fall prevention plan, rehab safety plan, and health resource notebook with verbal understanding. Patient resting in bed with family at bedside.

## 2014-07-11 ENCOUNTER — Inpatient Hospital Stay (HOSPITAL_COMMUNITY): Payer: 59

## 2014-07-11 ENCOUNTER — Inpatient Hospital Stay (HOSPITAL_COMMUNITY): Payer: 59 | Admitting: Physical Therapy

## 2014-07-11 ENCOUNTER — Inpatient Hospital Stay (HOSPITAL_COMMUNITY): Payer: 59 | Admitting: Speech Pathology

## 2014-07-11 ENCOUNTER — Ambulatory Visit (HOSPITAL_COMMUNITY): Payer: 59 | Attending: Physical Medicine and Rehabilitation

## 2014-07-11 DIAGNOSIS — M79605 Pain in left leg: Secondary | ICD-10-CM | POA: Insufficient documentation

## 2014-07-11 DIAGNOSIS — S42002S Fracture of unspecified part of left clavicle, sequela: Secondary | ICD-10-CM

## 2014-07-11 DIAGNOSIS — M79604 Pain in right leg: Secondary | ICD-10-CM | POA: Insufficient documentation

## 2014-07-11 DIAGNOSIS — S069X4S Unspecified intracranial injury with loss of consciousness 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, sequela: Secondary | ICD-10-CM

## 2014-07-11 DIAGNOSIS — M79609 Pain in unspecified limb: Secondary | ICD-10-CM

## 2014-07-11 LAB — CBC WITH DIFFERENTIAL/PLATELET
BASOS ABS: 0 10*3/uL (ref 0.0–0.1)
Basophils Relative: 0 % (ref 0–1)
EOS ABS: 0.2 10*3/uL (ref 0.0–0.7)
Eosinophils Relative: 2 % (ref 0–5)
HCT: 33.7 % — ABNORMAL LOW (ref 36.0–46.0)
Hemoglobin: 11.1 g/dL — ABNORMAL LOW (ref 12.0–15.0)
LYMPHS ABS: 0.8 10*3/uL (ref 0.7–4.0)
Lymphocytes Relative: 8 % — ABNORMAL LOW (ref 12–46)
MCH: 30.8 pg (ref 26.0–34.0)
MCHC: 32.9 g/dL (ref 30.0–36.0)
MCV: 93.6 fL (ref 78.0–100.0)
Monocytes Absolute: 1 10*3/uL (ref 0.1–1.0)
Monocytes Relative: 10 % (ref 3–12)
NEUTROS PCT: 80 % — AB (ref 43–77)
Neutro Abs: 8 10*3/uL — ABNORMAL HIGH (ref 1.7–7.7)
PLATELETS: 446 10*3/uL — AB (ref 150–400)
RBC: 3.6 MIL/uL — AB (ref 3.87–5.11)
RDW: 16 % — ABNORMAL HIGH (ref 11.5–15.5)
WBC: 10 10*3/uL (ref 4.0–10.5)

## 2014-07-11 LAB — COMPREHENSIVE METABOLIC PANEL
ALT: 48 U/L (ref 14–54)
AST: 29 U/L (ref 15–41)
Albumin: 3.4 g/dL — ABNORMAL LOW (ref 3.5–5.0)
Alkaline Phosphatase: 164 U/L — ABNORMAL HIGH (ref 38–126)
Anion gap: 7 (ref 5–15)
BUN: 20 mg/dL (ref 6–20)
CO2: 28 mmol/L (ref 22–32)
Calcium: 8.6 mg/dL — ABNORMAL LOW (ref 8.9–10.3)
Chloride: 98 mmol/L — ABNORMAL LOW (ref 101–111)
Creatinine, Ser: 0.33 mg/dL — ABNORMAL LOW (ref 0.44–1.00)
GFR calc Af Amer: >60 mL/min (ref >60)
GFR calc non Af Amer: >60 mL/min (ref >60)
Glucose, Bld: 134 mg/dL — ABNORMAL HIGH (ref 65–99)
Potassium: 4 mmol/L (ref 3.5–5.1)
Sodium: 133 mmol/L — ABNORMAL LOW (ref 135–145)
Total Bilirubin: 1.2 mg/dL (ref 0.3–1.2)
Total Protein: 6.7 g/dL (ref 6.5–8.1)

## 2014-07-11 LAB — GLUCOSE, CAPILLARY: Glucose-Capillary: 118 mg/dL — ABNORMAL HIGH (ref 65–99)

## 2014-07-11 MED ORDER — FREE WATER
200.0000 mL | Freq: Three times a day (TID) | Status: DC
  Administered 2014-07-11 – 2014-07-17 (×17): 200 mL

## 2014-07-11 MED ORDER — FREE WATER
200.0000 mL | Freq: Three times a day (TID) | Status: DC
  Administered 2014-07-11: 200 mL

## 2014-07-11 MED ORDER — IPRATROPIUM-ALBUTEROL 0.5-2.5 (3) MG/3ML IN SOLN
3.0000 mL | Freq: Three times a day (TID) | RESPIRATORY_TRACT | Status: DC
  Administered 2014-07-11 – 2014-07-12 (×3): 3 mL via RESPIRATORY_TRACT
  Filled 2014-07-11 (×4): qty 3

## 2014-07-11 MED ORDER — IPRATROPIUM-ALBUTEROL 0.5-2.5 (3) MG/3ML IN SOLN: 3.0000 mL | RESPIRATORY_TRACT | Status: DC | PRN

## 2014-07-11 MED ORDER — JEVITY 1.2 CAL PO LIQD
1000.0000 mL | ORAL | Status: DC
  Administered 2014-07-11 – 2014-07-15 (×6): 1000 mL
  Filled 2014-07-11 (×11): qty 1000

## 2014-07-11 MED ORDER — ALPRAZOLAM 0.25 MG PO TABS
0.2500 mg | ORAL_TABLET | Freq: Three times a day (TID) | ORAL | Status: DC | PRN
  Administered 2014-07-11 – 2014-07-18 (×11): 0.25 mg via ORAL
  Filled 2014-07-11 (×11): qty 1

## 2014-07-11 MED ORDER — PRO-STAT SUGAR FREE PO LIQD
30.0000 mL | Freq: Every day | ORAL | Status: DC
  Administered 2014-07-11 – 2014-07-16 (×6): 30 mL
  Filled 2014-07-11 (×9): qty 30

## 2014-07-11 NOTE — Progress Notes (Signed)
Patient is in therapy at this time unable to do tracheostomy check. RT will continue to monitor.
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
## 2014-07-11 NOTE — Progress Notes (Signed)
VASCULAR LAB PRELIMINARY  PRELIMINARY  PRELIMINARY  PRELIMINARY  Bilateral lower ext. Venous duplex completed.  No evidence of deep or superficial vein thrombosis in the lower extremities. Chauncy Lean, RVT 07/11/2014, 3:46 PM

## 2014-07-11 NOTE — Progress Notes (Signed)
Orthopedic Tech Progress Note Patient Details:  Tiffany Stevens 01/01/1956 [id number]  Ortho Devices Type of Ortho Device: Arm sling Ortho Device/Splint Location: lue Ortho Device/Splint Interventions: Application   Yazmen Briones 07/11/2014, 8:49 AM

## 2014-07-11 NOTE — Progress Notes (Signed)
Physical Therapy Session Note  Patient Details  Name: Tiffany Stevens MRN: [id number] Date of Birth: 1955/08/15  Today's Date: 07/11/2014 PT Individual Time: 1138-1208 PT Individual Time Calculation (min): 30 min   Skilled Therapeutic Interventions/Progress Updates:  Session focused on functional mobility training and activity tolerance. Patient semi reclined in bed with husband present for session. Patient transferred supine > sit with HOB slightly elevated, min A to elevate trunk and max verbal cues for sequencing and limiting WB through LUE. Patient performed stand step transfers without device with min A overall throughout session. Gait training without device in home environment x 75 ft in day room with min A overall. Patient performed sit <> stand from wheelchair with min A and HHA x 10 for BLE strengthening with cues for engaging core musculature due to posterior trunk lean and dependence on Y ligaments for upright posture. Patient required min A for standing balance. Patient left sitting in recliner with BLE elevated, call bell within reach, and husband in room. Patient maintained on room air throughout session with 02 sats WNL.   Therapy Documentation Precautions:  Precautions Precautions: Cervical, Fall, Other (comment) Precaution Comments: NG tube Required Braces or Orthoses: Cervical Brace Cervical Brace: Hard collar, At all times Restrictions Weight Bearing Restrictions: No Pain: Unrated pain in posterior neck, RN made aware   See FIM for current functional status  Therapy/Group: Individual Therapy  Kerney Elbe 07/11/2014, 3:21 PM
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
## 2014-07-11 NOTE — Plan of Care (Signed)
Problem: RH BLADDER ELIMINATION Goal: RH STG MANAGE BLADDER WITH ASSISTANCE STG Manage Bladder With Assistance  Foley Goal: RH STG MANAGE BLADDER WITH EQUIPMENT WITH ASSISTANCE STG Manage Bladder With Equipment With Assistance  Foley

## 2014-07-11 NOTE — Progress Notes (Signed)
Patient information reviewed and entered into eRehab system by Earnest Mcgillis, RN, CRRN, PPS Coordinator.  Information including medical coding and functional independence measure will be reviewed and updated through discharge.    

## 2014-07-12 ENCOUNTER — Inpatient Hospital Stay (HOSPITAL_COMMUNITY): Payer: 59

## 2014-07-12 ENCOUNTER — Inpatient Hospital Stay (HOSPITAL_COMMUNITY): Payer: Self-pay | Admitting: Occupational Therapy

## 2014-07-12 ENCOUNTER — Inpatient Hospital Stay (HOSPITAL_COMMUNITY): Payer: 59 | Admitting: Speech Pathology

## 2014-07-12 ENCOUNTER — Inpatient Hospital Stay (HOSPITAL_COMMUNITY): Payer: Self-pay | Admitting: Physical Therapy

## 2014-07-12 ENCOUNTER — Inpatient Hospital Stay (HOSPITAL_COMMUNITY): Payer: 59 | Admitting: Physical Therapy

## 2014-07-12 LAB — GLUCOSE, CAPILLARY
GLUCOSE-CAPILLARY: 167 mg/dL — AB (ref 65–99)
Glucose-Capillary: 114 mg/dL — ABNORMAL HIGH (ref 65–99)
Glucose-Capillary: 161 mg/dL — ABNORMAL HIGH (ref 65–99)
Glucose-Capillary: 121 mg/dL — ABNORMAL HIGH (ref 65–99)
Glucose-Capillary: 145 mg/dL — ABNORMAL HIGH (ref 65–99)

## 2014-07-12 MED ORDER — IPRATROPIUM-ALBUTEROL 0.5-2.5 (3) MG/3ML IN SOLN
3.0000 mL | Freq: Two times a day (BID) | RESPIRATORY_TRACT | Status: DC
  Administered 2014-07-13 – 2014-07-21 (×14): 3 mL via RESPIRATORY_TRACT
  Filled 2014-07-12 (×18): qty 3

## 2014-07-12 MED ORDER — FLEET ENEMA 7-19 GM/118ML RE ENEM
1.0000 | ENEMA | Freq: Once | RECTAL | Status: AC
  Administered 2014-07-12: 1 via RECTAL

## 2014-07-12 NOTE — Progress Notes (Signed)
KUB indicates mild ileus? Patient has been tolerating tube feeds without N/V. Abdomen is soft and non-tender with+BS. Will administer enema today and check follow up xrays in am.

## 2014-07-12 NOTE — Progress Notes (Signed)
Occupational Therapy Session Note  Patient Details  Name: Tiffany Stevens MRN: [id number] Date of Birth: Apr 20, 1955  Today's Date: 07/12/2014 OT Individual Time: 1530-1555 OT Individual Time Calculation (min): 25 min    Short Term Goals: Week 1:  OT Short Term Goal 1 (Week 1): Pt will complete UB dressing with min A to increase functional independence. OT Short Term Goal 2 (Week 1): Pt will complete LB dressing with min A to increase functional independence.  OT Short Term Goal 3 (Week 1): Pt will complete sit<>stand transfers at close supervision level to increase functional independence.  OT Short Term Goal 4 (Week 1): Pt will engage in functional activity in standing for 3 minutes to increase functional activity tolerance.   Skilled Therapeutic Interventions/Progress Updates:    Pt received from therapy gym and transitioning to this OT session without difficulty. Family member present throughout session observing. OT provided total A and propelled wheelchair to day room in order to conserve energy and time. Pt standing with min A and ambulating hand held while stopping to water plans with R UE. Pt requiring hand over hand assist in order to water plants as water container heavy for pt. She was able to problem solve and skip over plants that did not need to be watered. Pt sitting at table and looking over grocery advertisement and was able to locate items as called out as well as identify things that pt enjoys eating. Pt picking up 8 ping pong balls and placing back into container with R hand with 1 drop this session. Pt gesturing to head and holding up 7/10 for pain level. RN notified and pt returning to room. Pt performed stand pivot with min A back to bed from wheelchair and supervision for sit> supine. Call bell and all needed items within reach, bed alarm activated, and family remaining present in pt room upon exiting.   Therapy Documentation Precautions:  Precautions Precautions: Cervical,  Fall Precaution Comments: NG tube Required Braces or Orthoses: Cervical Brace, Sling Cervical Brace: Hard collar, At all times Restrictions Weight Bearing Restrictions: Yes (to left arm) Vital Signs: Therapy Vitals Temp: 98.8 F (37.1 C) Temp Source: Oral Pulse Rate: (!) 103 Resp: 18 BP: 107/66 mmHg Patient Position (if appropriate): Lying Oxygen Therapy SpO2: 100 % O2 Device: Tracheostomy Collar O2 Flow Rate (L/min): 5 L/min FiO2 (%): 28 % Pain: Pain Assessment Pain Assessment: Faces Faces Pain Scale: Hurts even more Pain Type: Acute pain Pain Location: Neck Pain Orientation: Posterior Pain Descriptors / Indicators: Aching;Grimacing Pain Onset: On-going Pain Intervention(s): Emotional support;Repositioned;Rest  See FIM for current functional status  Therapy/Group: Individual Therapy  Lowella Grip 07/12/2014, 4:52 PM
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
## 2014-07-12 NOTE — Progress Notes (Signed)
Physical Therapy Session Note  Patient Details  Name: Tiffany Stevens MRN: [id number] Date of Birth: [birth year]  Today's Date: 07/12/2014 PT Individual Time: 1330-1415 PT Individual Time Calculation (min): 45 min   Short Term Goals: Week 1:  PT Short Term Goal 1 (Week 1): = LTGs of overall supervision  Skilled Therapeutic Interventions/Progress Updates:   Patient received semi reclined in bed, respiratory therapist departing. Patient transferred to edge of bed with HOB slightly elevated with min A for trunk and performed stand pivot transfer to wheelchair with min A. Patient propelled wheelchair using BLE only forward with min A halfway to gym before switching to propelling backwards with supervision-min guard for steering x 150 ft and min verbal cues for technique. Patient would frequently stop and gesture for therapist to push wheelchair, required mod encouragement to complete task. Patient negotiated up/down four 6" steps using R rail (max multimodal cues to prevent patient from using LUE due to clavicle fx) with min A overall, self elected step-to pattern. Patient performed 5x sit <> stand with min A = 18 sec. Patient completed normal TUG with average of 3 trials with min-mod A and no device = 19.67 sec. Patient demonstrated decreased safety with turning, requiring mod A to prevent fall on all 3 trials. Patient instructed in ambulation with trial of SPC in RUE but patient refused to participate further in session despite max encouragement/coaxing. Patient required prolonged rest breaks following each task. Patient on room air with 02 sats WFL throughout session. Patient requested to return to bed at end of session with min A overall, left semi reclined in bed with call bell within reach and bed alarm on with family friend/member in room.   Five times Sit to Stand Test (FTSS) Method: Use a straight back chair with a solid seat that is 16-18" high. Ask participant to sit on the chair with arms folded  across their chest.   Instructions: "Stand up and sit down as quickly as possible 5 times, keeping your arms folded across your chest."   Measurement: Stop timing when the participant stands the 5th time.  TIME: ___18 with min A___ (in seconds)  Times > 13.6 seconds is associated with increased disability and morbidity (Guralnik, 2000) Times > 15 seconds is predictive of recurrent falls in healthy individuals aged 70 and older (Buatois, et al., 2008) Normal performance values in community dwelling individuals aged 34 and older (Bohannon, 2006): o 60-69 years: 11.4 seconds o 70-79 years: 12.6 seconds o 80-89 years: 14.8 seconds  MCID: ? 2.3 seconds for Vestibular Disorders Wray Kearns, 2006)  Therapy Documentation Precautions:  Precautions Precautions: Cervical, Fall Precaution Comments: NG tube Required Braces or Orthoses: Cervical Brace, Sling Cervical Brace: Hard collar, At all times Restrictions Weight Bearing Restrictions: Yes (to left arm) Pain: Pain Assessment Pain Assessment: Faces Pain Score: 8  Faces Pain Scale: Hurts even more Pain Type: Acute pain Pain Location: Neck Pain Orientation: Posterior Pain Descriptors / Indicators: Aching;Grimacing Pain Onset: On-going Pain Intervention(s): Emotional support;Repositioned;Rest Locomotion : Ambulation Ambulation/Gait Assistance: 4: Min assist;3: Mod assist   See FIM for current functional status  Therapy/Group: Individual Therapy  Kerney Elbe 07/12/2014, 3:09 PM

## 2014-07-12 NOTE — Progress Notes (Signed)
Physical Therapy Session Note  Patient Details  Name: Tiffany Stevens MRN: [id number] Date of Birth: [birth year]  Today's Date: 07/12/2014 PT Individual Time: 1140-1205 PT Individual Time Calculation (min): 25 min   Short Term Goals: Week 1:  PT Short Term Goal 1 (Week 1): = LTGs of overall supervision  Skilled Therapeutic Interventions/Progress Updates:   Pt received in w/c with family present, finishing OT session.  RN also present to administer pain medication.  Once medication received pt transported to gym in w/c total A.  Pt now with PMSV donned and able to vocalize one word answers but continued to use head nods or request writing board to communicate.  Pt verified presence of stairs to enter house without rails.  Performed ambulation from w/c > stairs x 15'with R HHA with mod A secondary to LOB to L and assistance needed for weight shifting to R.  Negotiated 8 stairs with no UE support on hand rails step to sequence but with mod A of therapist and verbal cues for R lateral weight shift during L advancement.  During descent pt became very fatigued and required increased assistance for balance and cues for sequence.  Returned to w/c mod HHA; returned to room and performed transfer stand pivot to bed min-mod A and sit > supine supervision.  Positioned pt in bed with UE supported on pillow and removed PMSV for sleeping.  Pt left with all items within reach.    Therapy Documentation Precautions:  Precautions Precautions: Cervical, Fall Precaution Comments: NG tube Required Braces or Orthoses: Cervical Brace, Sling Cervical Brace: Hard collar, At all times Restrictions Weight Bearing Restrictions: Yes (to left arm) Vital Signs: Therapy Vitals Pulse Rate: (!) 118 Oxygen Therapy SpO2: 98 % O2 Device: Tracheostomy Collar O2 Flow Rate (L/min): 5 L/min Pulse Oximetry Type: Continuous Pain: Pain Assessment Pain Assessment: 0-10 Pain Score: 5  Pain Type: Acute pain Pain Location:  Shoulder Pain Orientation: Left Pain Descriptors / Indicators: Aching Pain Onset: On-going Pain Intervention(s): Other (Comment) (premedicated) Locomotion : Ambulation Ambulation/Gait Assistance: 4: Min assist;3: Mod assist   See FIM for current functional status  Therapy/Group: Individual Therapy  Edman Circle [facility] 07/12/2014, 12:34 PM

## 2014-07-12 NOTE — Progress Notes (Signed)
Occupational Therapy Session Note  Patient Details  Name: Tiffany Stevens MRN: [id number] Date of Birth: 1955/04/29  Today's Date: 07/12/2014 OT Individual Time: 1100-1130 OT Individual Time Calculation (min): 30 min    Short Term Goals: Week 1:  OT Short Term Goal 1 (Week 1): Pt will complete UB dressing with min A to increase functional independence. OT Short Term Goal 2 (Week 1): Pt will complete LB dressing with min A to increase functional independence.  OT Short Term Goal 3 (Week 1): Pt will complete sit<>stand transfers at close supervision level to increase functional independence.  OT Short Term Goal 4 (Week 1): Pt will engage in functional activity in standing for 3 minutes to increase functional activity tolerance.   Skilled Therapeutic Interventions/Progress Updates:    1:1 Pt continues to communicate through gestures and written communication. Pt's friend was present for the session. After RN was called for pain meds pt particpated in bed mobility with min A with max cuing for sequence. Pt wanted to pull on therapist arms during mobility but was able to direct her for more safe and effective mobility techniques. Pt once at EOB pt able to transfer into w/c with min A with extra time due to lines. Pt performed oral care at the sink with suction with setup and A to cover the suction hole on the younker swab. PT came in and pt transitioned to next therapy.   Therapy Documentation Precautions:  Precautions Precautions: Cervical, Fall Precaution Comments: NG tube Required Braces or Orthoses: Cervical Brace, Sling Cervical Brace: Hard collar, At all times Restrictions Weight Bearing Restrictions: Yes (to left arm) General:   Vital Signs: Therapy Vitals Temp: 98.8 F (37.1 C) Temp Source: Oral Pulse Rate: (!) 103 Resp: 18 BP: 107/66 mmHg Patient Position (if appropriate): Lying Oxygen Therapy SpO2: 100 % O2 Device: Tracheostomy Collar O2 Flow Rate (L/min): 5 L/min FiO2  (%): 28 % Pain: Reports 7/10 pain and requesting pain meds before participating in mobility tasks.   See FIM for current functional status  Therapy/Group: Individual Therapy  [name] [facility] 07/12/2014, 6:40 PM

## 2014-07-12 NOTE — Progress Notes (Signed)
Recreational Therapy Session Note  Patient Details  Name: Tiffany Stevens MRN: [id number] Date of Birth: 1955/10/28 Today's Date: 07/12/2014  Order received and chart reviewed. Pt placed on HOLD for TR services at this time as team feels pt is not yet appropriate for TR services.  Will continue to monitor through team for future participation. Naavya Postma 07/12/2014, 4:07 PM

## 2014-07-12 NOTE — Procedures (Signed)
Assisted RN with trach care.  No complications noted.

## 2014-07-12 NOTE — Progress Notes (Signed)
Occupational Therapy Session Note  Patient Details  Name: Tiffany Stevens MRN: [id number] Date of Birth: March 10, 1955  Today's Date: 07/12/2014 OT Individual Time: 0710-0800 OT Individual Time Calculation (min): 50 min    Short Term Goals: Week 1:  OT Short Term Goal 1 (Week 1): Pt will complete UB dressing with min A to increase functional independence. OT Short Term Goal 2 (Week 1): Pt will complete LB dressing with min A to increase functional independence.  OT Short Term Goal 3 (Week 1): Pt will complete sit<>stand transfers at close supervision level to increase functional independence.  OT Short Term Goal 4 (Week 1): Pt will engage in functional activity in standing for 3 minutes to increase functional activity tolerance.   Skilled Therapeutic Interventions/Progress Updates:    Pt seen for 1:1 OT session with a focus on functional mobility, functional transfers, dynamic standing balance, sit<>stand, safety awareness, and activity tolerance. Pt received supine in bed with RN and MD present. Pt completed bed mobility with min A and mod verbal cues for technique and UE positioning. Pt completed transfer bed>w/c with min HHA. Pt declined to wash-up/change shirt but donned pants at sink. Pt required mod cues for safety and impulsivity during LB dressing. Pt completed dynamic standing activity with min A for balance. Pt demonstrated appropriate weight shift and increased upright posture with mod verbal cues. Pt required mod encouragement cues for participation in therapy. Pt continued to communicate via written expression and gestures with slight perseveration on RUE pain during session. Pt kept on room air during session and stats consistently stayed [facility]. Pt left at RN station after session.   Therapy Documentation Precautions:  Precautions Precautions: Cervical, Fall Precaution Comments: NG tube Required Braces or Orthoses: Cervical Brace, Sling Cervical Brace: Hard collar, At all  times Restrictions Weight Bearing Restrictions: Yes (to left arm) General: General OT Amount of Missed Time: 10 Minutes Vital Signs:  Pain: Pain Assessment Pain Score: 7  ADL:   Exercises:   Other Treatments:    See FIM for current functional status  Therapy/Group: Individual Therapy  Alger Memos 07/12/2014, 12:06 PM

## 2014-07-12 NOTE — Progress Notes (Signed)
Occupational Therapy Session Note  Patient Details  Name: Tiffany Stevens MRN: [id number] Date of Birth: January 15, 1956  Today's Date: 07/12/2014 OT Individual Time: 1445-1530 OT Individual Time Calculation (min): 45 min    Short Term Goals: Week 1:  OT Short Term Goal 1 (Week 1): Pt will complete UB dressing with min A to increase functional independence. OT Short Term Goal 2 (Week 1): Pt will complete LB dressing with min A to increase functional independence.  OT Short Term Goal 3 (Week 1): Pt will complete sit<>stand transfers at close supervision level to increase functional independence.  OT Short Term Goal 4 (Week 1): Pt will engage in functional activity in standing for 3 minutes to increase functional activity tolerance.   Skilled Therapeutic Interventions/Progress Updates:    Pt seen for 1:1 OT session with focus on functional transfers, functional mobility, activity tolerance, and RUE strengthening. Pt received supine in bed requiring cues of encouragement for participation. Pt completed supine>sit with min A and NG tube noted to begin coming out of nose. Notified RN who attempted to fix, however NG required removal. Pt sat EOB with PMSV on for 20-30 seconds 2x. SpO2 >96% and HR 110-120 therefore doffed PMSV. Pt required max cues of encouragement to transfer bed>w/c due to perseveration on RUE pain. Pt completed stand pivot transfer bed>w/c with min HHA. Practiced stand pivot transfer w/c<>couch with min A and min cues for safety. Engaged in functional mobility in ADL apartment apprx 20' with min HHA then stood at counter to remove small cans from cabinet with use of RUE. Pt utilized forearm supination as compensatory technique to sustain control of can due to decreased grip strength. Pt required rest break after activity then declined further activities in standing. Provided yellow foam block for R hand strengthening with pt utilizing gross grip to squeeze 10x on 2 occasions. Engaged in  therapeutic conversation with use of written expression. Pt recording year she moved to [place] however sister present and reporting pt was incorrect by 12 years. Will continue to assess cognition. At end of session pt handed off to next OT for therapy session.  Therapy Documentation Precautions:  Precautions Precautions: Cervical, Fall Precaution Comments: NG tube Required Braces or Orthoses: Cervical Brace, Sling Cervical Brace: Hard collar, At all times Restrictions Weight Bearing Restrictions: Yes (to left arm) General:   Vital Signs: Therapy Vitals Temp: 98.8 F (37.1 C) Temp Source: Oral Pulse Rate: 90 Resp: 20 BP: 107/66 mmHg Patient Position (if appropriate): Lying Oxygen Therapy SpO2: 99 % O2 Device: Tracheostomy Collar O2 Flow Rate (L/min): 5 L/min FiO2 (%): 28 % Pain: Pain Assessment Pain Assessment: Faces Pain Score: 8  Faces Pain Scale: Hurts even more Pain Type: Acute pain Pain Location: Neck Pain Orientation: Posterior Pain Descriptors / Indicators: Aching;Grimacing Pain Onset: On-going Pain Intervention(s): Emotional support;Repositioned;Rest  See FIM for current functional status  Therapy/Group: Individual Therapy  [name] 07/12/2014, 4:06 PM

## 2014-07-13 ENCOUNTER — Inpatient Hospital Stay (HOSPITAL_COMMUNITY): Payer: Self-pay

## 2014-07-13 ENCOUNTER — Inpatient Hospital Stay (HOSPITAL_COMMUNITY): Payer: 59 | Admitting: Physical Therapy

## 2014-07-13 ENCOUNTER — Inpatient Hospital Stay (HOSPITAL_COMMUNITY): Payer: 59

## 2014-07-13 ENCOUNTER — Inpatient Hospital Stay (HOSPITAL_COMMUNITY): Payer: 59 | Admitting: Speech Pathology

## 2014-07-13 LAB — GLUCOSE, CAPILLARY
GLUCOSE-CAPILLARY: 122 mg/dL — AB (ref 65–99)
Glucose-Capillary: 172 mg/dL — ABNORMAL HIGH (ref 65–99)
Glucose-Capillary: 100 mg/dL — ABNORMAL HIGH (ref 65–99)

## 2014-07-13 MED ORDER — GUAIFENESIN 100 MG/5ML PO SOLN
5.0000 mL | Freq: Three times a day (TID) | ORAL | Status: DC
  Administered 2014-07-13 – 2014-07-19 (×23): 100 mg via ORAL
  Filled 2014-07-13 (×28): qty 5

## 2014-07-13 MED ORDER — LIDOCAINE HCL 2 % EX GEL
CUTANEOUS | Status: DC | PRN
  Filled 2014-07-13: qty 5

## 2014-07-13 NOTE — Progress Notes (Signed)
Social Work Patient ID: Tiffany Stevens, female   DOB: [birth year], 59 y.o.   MRN: [id number] Met with pt and husband to inform of team conference goals-supervision/min level and target discharge 7/2.  Husband states: " That is pretty optimistic, sounds too soon to me." He spoke with Pam-PA and also MD regarding his concerns.  Will meet again next Tuesday and MD planning to downsize trach today and remove foley. Husband aware pt will require 24 hr care He will talk with daughter about coming in next week. Aware Lorre Nick to return Monday and continue to follow for discharge needs.

## 2014-07-13 NOTE — Progress Notes (Signed)
Pt CBG was at 159. Order to notify MD of greater than 160. Deatra Ina - PA notified. No new orders given. Notified oncoming nurse.

## 2014-07-13 NOTE — Care Management Note (Signed)
[facility] Individual Statement of Services  Patient Name:  Tiffany Stevens  Date:  07/13/2014  Welcome to the [facility].  Our goal is to provide you with an individualized program based on your diagnosis and situation, designed to meet your specific needs.  With this comprehensive rehabilitation program, you will be expected to participate in at least 3 hours of rehabilitation therapies Monday-Friday, with modified therapy programming on the weekends.  Your rehabilitation program will include the following services:  Physical Therapy (PT), Occupational Therapy (OT), Speech Therapy (ST), 24 hour per day rehabilitation nursing, Therapeutic Recreaction (TR), Neuropsychology, Case Management (Social Worker), Rehabilitation Medicine, Nutrition Services and Pharmacy Services  Weekly team conferences will be held on Wednesday to discuss your progress.  Your Social Worker will talk with you frequently to get your input and to update you on team discussions.  Team conferences with you and your family in attendance may also be held.  Expected length of stay: 10-14 days  Overall anticipated outcome: supervision/min level  Depending on your progress and recovery, your program may change. Your Social Worker will coordinate services and will keep you informed of any changes. Your Social Worker's name and contact numbers are listed  below.  The following services may also be recommended but are not provided by the [facility]:   Driving Evaluations  Home Health Rehabiltiation Services  Outpatient Rehabilitation Services  Vocational Rehabilitation   Arrangements will be made to provide these services after discharge if needed.  Arrangements include referral to agencies that provide these services.  Your insurance has been verified to be:  [facility] Your primary doctor is:  Renard Hamper  Pertinent information will be shared with your doctor and your  insurance company.  Social Worker:  Amada [place], [place] [phone] or ([phone]   Information discussed with and copy given to patient by: Lucy Chris, 07/13/2014, 9:20 AM

## 2014-07-13 NOTE — Progress Notes (Signed)
Changed out trach to #4 shiley cuffless.  The change was uneventful.  Pt doing well and sats remained stable.
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
## 2014-07-13 NOTE — Progress Notes (Signed)
Occupational Therapy Session Note  Patient Details  Name: Tiffany Stevens MRN: [id number] Date of Birth: 07/28/1955  Today's Date: 07/13/2014 OT Individual Time: 1100-1130 OT Individual Time Calculation (min): 30 min  and Today's Date: 07/13/2014 OT Missed Time: 30 Minutes Missed Time Reason: Other (comment) (respiratory care-change trach)   Short Term Goals: Week 1:  OT Short Term Goal 1 (Week 1): Pt will complete UB dressing with min A to increase functional independence. OT Short Term Goal 2 (Week 1): Pt will complete LB dressing with min A to increase functional independence.  OT Short Term Goal 3 (Week 1): Pt will complete sit<>stand transfers at close supervision level to increase functional independence.  OT Short Term Goal 4 (Week 1): Pt will engage in functional activity in standing for 3 minutes to increase functional activity tolerance.   Skilled Therapeutic Interventions/Progress Updates:    Pt resting in bed after respiratory care changed trach.  Pt's husband present during session. Pt agreeable to sitting in chair and performing sit<>stand and standing balance activities.  Pt requires max multimodal cues to adhere to LUE NWB status.  Pt stood X 4 for 60 seconds each time.  O2 sats >94% on RA.  Pt required multiple rest breaks.  Pt c/o dizziness with transitional movements and when initially standing but quickly resolved.  Pt returned to bed at end of session. Focus on activity tolerance, sit<>stand, standing balance, and safety awareness.   Therapy Documentation Precautions:  Precautions Precautions: Cervical, Fall Precaution Comments: NG tube Required Braces or Orthoses: Cervical Brace, Sling Cervical Brace: Hard collar, At all times Restrictions Weight Bearing Restrictions: Yes General: General OT Amount of Missed Time: 30 Minutes Pain: Pain Assessment Pain Assessment: 0-10 Pain Score: 5  Faces Pain Scale: Hurts even more Pain Type: Acute pain Pain Location:  Generalized Pain Intervention(s): RN aware See FIM for current functional status  Therapy/Group: Individual Therapy  Rich Brave 07/13/2014, 12:26 PM

## 2014-07-13 NOTE — Progress Notes (Signed)
Patient seen for follow up.  Trach clean and dry.  Recently downsized to 4 CFS.  A spare 4.0 CFS ordered.  No further education needed.

## 2014-07-13 NOTE — Progress Notes (Signed)
Physical Therapy Session Note  Patient Details  Name: Tiffany Stevens MRN: [id number] Date of Birth: November 15, 1955  Today's Date: 07/13/2014 PT Individual Time: 0800-0900 and 1500-1600 PT Individual Time Calculation (min): 60 min and 60 min  Short Term Goals: Week 1:  PT Short Term Goal 1 (Week 1): = LTGs of overall supervision  Skilled Therapeutic Interventions/Progress Updates:   Session 1: Focus on functional transfers and ambulation, strengthening, and activity tolerance. Patient received semi reclined in bed with family present. With increased time, patient transferred to edge of bed with min A to elevate trunk due to limited LUE WB and HOB slightly elevated to don pants with sit <> stand from edge of bed, min A. Patient propelled wheelchair using BLE forward x 50 ft and backward x 100 ft with min A for steering. Patient ambulated throughout rehab unit multiple trials with and without use of SPC up to 50-100 ft, min A overall with heavy listing to left. Stair training up/down eight 6" steps using R rail only with L HHA to prevent WB on railing, min A overall. Patient performed furniture transfers to arm chairs and low compliant couch surface with min A/guard. Patient ambulated back to room from ADL apartment x 250 ft using SPC with min A overall. Patient transferred sit > supine with HOB flat with min A and left semi reclined in bed with transport to go to xray. Patient demonstrated improved tolerance to upright activity with decreased SOB noted this date with increased motivation/willingness to participate.   Session 2: Focus on standing balance, activity tolerance, functional transfers and ambulation, and safety. Patient transferred supine > sit with min guard and ambulated from edge of bed to therapy gym using SPC with min A x 170 ft with one seated rest break at chairs by elevator. Patient performed standing with supervision and single UE support on tray table while playing game of Connect 4 with  husband, cues for turn taking. Patient able to tolerate standing x 15-20 min for entire game. Patient transferred sit <> stand throughout session with supervision. In standing, patient played 3 games of horseshoes with close supervision and max verbal/visual cues for upright posture due to lateral trunk lean left and lateral head tilt to R. Patient performed side stepping to R and L x 30 ft each direction with min-mod A and ambulated back to room with close supervision-min A. Patient gesturing for therapist to straighten up bed, therapist provided min A for patient to straighten up bed before returning to supine with min guard. Patient left semi reclined in bed with bed alarm on, all needs within reach and husband in room. Patient maintained on room air throughout session with 02 sats WFL. Patient tolerated session well.   Therapy Documentation Precautions:  Precautions Precautions: Cervical, Fall Precaution Comments: NG tube Required Braces or Orthoses: Cervical Brace, Sling Cervical Brace: Hard collar, At all times Restrictions Weight Bearing Restrictions: Yes (to left arm Simultaneous filing. User may not have seen previous data.) Pain: Pain Assessment Pain Assessment: 0-10 Pain Score: 5  Pain Type: Acute pain Pain Location: Shoulder Pain Orientation: Left Pain Intervention(s): Medication (See eMAR) 2nd Pain Site Pain Score: 5 Pain Type: Acute pain Pain Location: Head Pain Intervention(s): Medication (See eMAR) Locomotion : Ambulation Ambulation/Gait Assistance: 4: Min assist   See FIM for current functional status  Therapy/Group: Individual Therapy  Kerney Elbe 07/13/2014, 9:15 AM

## 2014-07-13 NOTE — Progress Notes (Signed)
Occupational Therapy Note  Patient Details  Name: Tiffany Stevens MRN: [id number] Date of Birth: 04/20/1955  Today's Date: 07/13/2014 OT Individual Time: 1400-1430 OT Individual Time Calculation (min): 30 min   Pt denied pain Individual Therapy  Pt resting in bed upon arrival.  Pt requested to use toilet.  Pt amb with HHA to toilet for bowel movement.  Pt's husband present and assisted during therapy session.  Pt required max multimodal cues for safety awareness and to prevent bearing weight through LUE.     Lavone Neri [facility] 07/13/2014, 2:52 PM
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
## 2014-07-13 NOTE — Progress Notes (Signed)
Speech Language Pathology Daily Session Note  Patient Details  Name: Tiffany Stevens MRN: [id number] Date of Birth: [birth year]  Today's Date: 07/13/2014 SLP Individual Time: 0930-1030 SLP Individual Time Calculation (min): 60 min  Short Term Goals: Week 1: SLP Short Term Goal 1 (Week 1): Patient will consume ice chips with no more than 3 swallows per trial to demonstrate readiness for objective assessment.  SLP Short Term Goal 2 (Week 1): Patient will demonstrate toleration of PMSV with vitals remaining [place] for 15 minutes with Min cues for expectoration of secreations.      SLP Short Term Goal 3 (Week 1): Patient will produce phrase-sentence level verbal expression with Min cues for increased vocal intensity.  SLP Short Term Goal 4 (Week 1): Patient with demonstrate basic safety awareness and problem solving by requesting help as needed with Min question cues during self-care tasks.   Skilled Therapeutic Interventions:   Skilled treatment session focused on addressing PMSV and dysphagia goals. SLP facilitated session by donning PMSV, which patient tolerated for 60 minutes with 2 rest breaks per her request.  Today's vocal quality was initially wet and gurgly; however, following Mod cues to orally expectorate secretions left vocal quality hoarse and breathy.  Trials of ice chips resulted in overt s/s of aspiration in 3/3 trials and trials of pureed textures resulted in no overt s/s of aspiration following multiple swallows in 3/3 trials.  Given improved ability to orally expectorate secretions due to suspected reduction in pharyngeal edema, recommend an objective swallow assessment tomorrow.  Continue with current plan of care.  FIM:  Comprehension Comprehension Mode: Auditory Comprehension: 5-Follows basic conversation/direction: With extra time/assistive device Expression Expression Mode: Verbal Expression Assistive Devices: 6-Talk trach valve Expression: 3-Expresses basic 50 - 74% of the  time/requires cueing 25 - 50% of the time. Needs to repeat parts of sentences. Social Interaction Social Interaction: 4-Interacts appropriately 75 - 89% of the time - Needs redirection for appropriate language or to initiate interaction. Problem Solving Problem Solving: 4-Solves basic 75 - 89% of the time/requires cueing 10 - 24% of the time Memory Memory: 4-Recognizes or recalls 75 - 89% of the time/requires cueing 10 - 24% of the time FIM - Eating Eating Activity: 1: Helper feeds patient (with trials)  Pain Pain Assessment Pain Assessment: 0-10 Pain Score: 5  Faces Pain Scale: Hurts even more Pain Type: Acute pain Pain Location: Generalized Pain Intervention(s): Medication (See eMAR)  Therapy/Group: Individual Therapy  Charlane Ferretti., CCC-SLP [phone]  Tori Cupps 07/13/2014, 12:26 PM

## 2014-07-14 ENCOUNTER — Inpatient Hospital Stay (HOSPITAL_COMMUNITY): Payer: 59

## 2014-07-14 ENCOUNTER — Inpatient Hospital Stay (HOSPITAL_COMMUNITY): Payer: 59 | Admitting: Physical Therapy

## 2014-07-14 ENCOUNTER — Inpatient Hospital Stay (HOSPITAL_COMMUNITY): Payer: 59 | Admitting: Occupational Therapy

## 2014-07-14 ENCOUNTER — Inpatient Hospital Stay (HOSPITAL_COMMUNITY): Payer: 59 | Admitting: Speech Pathology

## 2014-07-14 ENCOUNTER — Inpatient Hospital Stay (HOSPITAL_COMMUNITY): Payer: Self-pay | Admitting: Occupational Therapy

## 2014-07-14 DIAGNOSIS — A499 Bacterial infection, unspecified: Secondary | ICD-10-CM | POA: Diagnosis present

## 2014-07-14 DIAGNOSIS — Z930 Tracheostomy status: Secondary | ICD-10-CM

## 2014-07-14 DIAGNOSIS — N390 Urinary tract infection, site not specified: Secondary | ICD-10-CM

## 2014-07-14 LAB — URINALYSIS, ROUTINE W REFLEX MICROSCOPIC
Bilirubin Urine: NEGATIVE
Glucose, UA: NEGATIVE mg/dL
Hgb urine dipstick: NEGATIVE
Ketones, ur: NEGATIVE mg/dL
Nitrite: NEGATIVE
Protein, ur: NEGATIVE mg/dL
Specific Gravity, Urine: 1.019 (ref 1.005–1.030)
Urobilinogen, UA: 2 mg/dL — ABNORMAL HIGH (ref 0.0–1.0)
pH: 6 (ref 5.0–8.0)

## 2014-07-14 LAB — URINE CULTURE: Culture: >=100000

## 2014-07-14 LAB — TYPE AND SCREEN
ABO/RH(D): A POS
ABO/RH(D): A POS
ANTIBODY SCREEN: NEGATIVE
Antibody Screen: NEGATIVE
UNIT DIVISION: 0
UNIT DIVISION: 0
UNIT DIVISION: 0
Unit division: 0
Unit division: 0
Unit division: 0

## 2014-07-14 LAB — URINE MICROSCOPIC-ADD ON

## 2014-07-14 LAB — GLUCOSE, CAPILLARY
Glucose-Capillary: 110 mg/dL — ABNORMAL HIGH (ref 65–99)
Glucose-Capillary: 118 mg/dL — ABNORMAL HIGH (ref 65–99)
Glucose-Capillary: 128 mg/dL — ABNORMAL HIGH (ref 65–99)
Glucose-Capillary: 134 mg/dL — ABNORMAL HIGH (ref 65–99)
Glucose-Capillary: 164 mg/dL — ABNORMAL HIGH (ref 65–99)

## 2014-07-14 LAB — CBC
HCT: 31.6 % — ABNORMAL LOW (ref 36.0–46.0)
HEMOGLOBIN: 10.4 g/dL — AB (ref 12.0–15.0)
MCH: 31.7 pg (ref 26.0–34.0)
MCHC: 32.9 g/dL (ref 30.0–36.0)
MCV: 96.3 fL (ref 78.0–100.0)
Platelets: 398 10*3/uL (ref 150–400)
RBC: 3.28 MIL/uL — ABNORMAL LOW (ref 3.87–5.11)
RDW: 16.3 % — ABNORMAL HIGH (ref 11.5–15.5)
WBC: 7.9 10*3/uL (ref 4.0–10.5)

## 2014-07-14 LAB — BASIC METABOLIC PANEL
Anion gap: 6 (ref 5–15)
BUN: 16 mg/dL (ref 6–20)
CHLORIDE: 96 mmol/L — AB (ref 101–111)
CO2: 32 mmol/L (ref 22–32)
Calcium: 8.5 mg/dL — ABNORMAL LOW (ref 8.9–10.3)
Creatinine, Ser: 0.41 mg/dL — ABNORMAL LOW (ref 0.44–1.00)
GFR calc Af Amer: >60 mL/min (ref >60)
GFR calc non Af Amer: >60 mL/min (ref >60)
Glucose, Bld: 143 mg/dL — ABNORMAL HIGH (ref 65–99)
Potassium: 4 mmol/L (ref 3.5–5.1)
SODIUM: 134 mmol/L — AB (ref 135–145)

## 2014-07-14 LAB — ABO/RH: ABO/RH(D): A POS

## 2014-07-14 MED ORDER — SIMETHICONE 40 MG/0.6ML PO SUSP
80.0000 mg | Freq: Four times a day (QID) | ORAL | Status: DC
  Administered 2014-07-14 – 2014-07-19 (×15): 80 mg
  Filled 2014-07-14 (×25): qty 1.2

## 2014-07-14 MED ORDER — MUSCLE RUB 10-15 % EX CREA
TOPICAL_CREAM | Freq: Three times a day (TID) | CUTANEOUS | Status: DC
  Administered 2014-07-14 – 2014-07-18 (×4): via TOPICAL
  Administered 2014-07-22: 1 via TOPICAL
  Filled 2014-07-14: qty 85

## 2014-07-14 MED ORDER — TROLAMINE SALICYLATE 10 % EX CREA
TOPICAL_CREAM | Freq: Three times a day (TID) | CUTANEOUS | Status: DC
  Filled 2014-07-14: qty 85

## 2014-07-14 MED ORDER — AMOXICILLIN 250 MG PO CAPS
250.0000 mg | ORAL_CAPSULE | Freq: Three times a day (TID) | ORAL | Status: DC
  Administered 2014-07-14 – 2014-07-17 (×9): 250 mg via ORAL
  Filled 2014-07-14 (×12): qty 1

## 2014-07-14 MED ORDER — BISACODYL 10 MG RE SUPP
10.0000 mg | Freq: Once | RECTAL | Status: AC
  Administered 2014-07-14: 10 mg via RECTAL
  Filled 2014-07-14: qty 1

## 2014-07-14 MED ORDER — POLYETHYLENE GLYCOL 3350 17 G PO PACK
17.0000 g | PACK | Freq: Two times a day (BID) | ORAL | Status: DC
  Administered 2014-07-14 – 2014-07-17 (×4): 17 g
  Filled 2014-07-14 (×12): qty 1

## 2014-07-14 MED ORDER — INSULIN ASPART 100 UNIT/ML ~~LOC~~ SOLN
0.0000 [IU] | Freq: Four times a day (QID) | SUBCUTANEOUS | Status: DC
  Administered 2014-07-15 (×2): 2 [IU] via SUBCUTANEOUS
  Administered 2014-07-15: 1 [IU] via SUBCUTANEOUS
  Administered 2014-07-16: 3 [IU] via SUBCUTANEOUS
  Administered 2014-07-16: 2 [IU] via SUBCUTANEOUS
  Administered 2014-07-16: 1 [IU] via SUBCUTANEOUS
  Administered 2014-07-16: 2 [IU] via SUBCUTANEOUS
  Administered 2014-07-17 – 2014-07-18 (×3): 1 [IU] via SUBCUTANEOUS
  Administered 2014-07-18: 2 [IU] via SUBCUTANEOUS
  Administered 2014-07-18: 1 [IU] via SUBCUTANEOUS
  Administered 2014-07-18: 2 [IU] via SUBCUTANEOUS
  Administered 2014-07-19 (×2): 1 [IU] via SUBCUTANEOUS
  Administered 2014-07-19: 2 [IU] via SUBCUTANEOUS
  Administered 2014-07-22 – 2014-07-23 (×5): 1 [IU] via SUBCUTANEOUS

## 2014-07-14 MED ORDER — INSULIN ASPART 100 UNIT/ML ~~LOC~~ SOLN: 0.0000 [IU] | Freq: Every day | SUBCUTANEOUS | Status: DC
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
## 2014-07-14 NOTE — Progress Notes (Signed)
Occupational Therapy Session Note  Patient Details  Name: Tiffany Stevens MRN: [id number] Date of Birth: Feb 14, 1955  Today's Date: 07/14/2014 OT Individual Time: 0800-0900 OT Individual Time Calculation (min): 60 min    Short Term Goals: Week 1:  OT Short Term Goal 1 (Week 1): Pt will complete UB dressing with min A to increase functional independence. OT Short Term Goal 2 (Week 1): Pt will complete LB dressing with min A to increase functional independence.  OT Short Term Goal 3 (Week 1): Pt will complete sit<>stand transfers at close supervision level to increase functional independence.  OT Short Term Goal 4 (Week 1): Pt will engage in functional activity in standing for 3 minutes to increase functional activity tolerance.   Skilled Therapeutic Interventions/Progress Updates:    Engaged in ADL retraining with focus on functional transfers, grooming, and LB dressing.  Pt in bed upon arrival, reports pain in stomach and requesting to toilet.  Performed stand pivot transfer bed > BSC with min assist.  LB dressing completed at sit > stand level with min assist for standing and mod assist for threading pants with cues due to impulsivity.  Pt requested to do oral care, provided with suction brush with pt able to complete with min cues to utilize suction.  Encouraged pt to remain seated upright in w/c to increase OOB tolerance and increase management of oral secretions.  Therapy Documentation Precautions:  Precautions Precautions: Cervical, Fall Precaution Comments: NG tube Required Braces or Orthoses: Cervical Brace, Sling Cervical Brace: Hard collar, At all times Restrictions Weight Bearing Restrictions: Yes General:   Vital Signs: Therapy Vitals Temp: 99.4 F (37.4 C) Temp Source: Oral Pulse Rate: 94 Resp: 18 BP: (!) 98/57 mmHg Patient Position (if appropriate): Sitting (On bedside toliet) Oxygen Therapy SpO2: 98 % O2 Device: Tracheostomy Collar O2 Flow Rate (L/min): 5  L/min FiO2 (%): 28 % Pain: Pain Assessment Pain Score: 5  Pain Type: Acute pain Pain Location: Shoulder Pain Orientation: Left Pain Intervention(s): Medication (See eMAR)  See FIM for current functional status  Therapy/Group: Individual Therapy  Rosalio Loud 07/14/2014, 9:43 AM

## 2014-07-14 NOTE — Progress Notes (Signed)
Called Dr. Wynn Banker regarding which insulin sliding scale to use. Ordered to d/c 0-5 sliding scale at 2200 and follow 0-9 scale scheduled for 0000 that corresponds with Q6 sugars. Regino Schultze, RN

## 2014-07-14 NOTE — Progress Notes (Signed)
Speech Language Pathology Daily Session Note  Patient Details  Name: Tiffany Stevens MRN: [id number] Date of Birth: 15-Jun-1955  Today's Date: 07/14/2014 SLP Individual Time: 0930-1015 SLP Individual Time Calculation (min): 45 min  Short Term Goals: Week 1: SLP Short Term Goal 1 (Week 1): Patient will consume ice chips with no more than 3 swallows per trial to demonstrate readiness for objective assessment.  SLP Short Term Goal 2 (Week 1): Patient will demonstrate toleration of PMSV with vitals remaining [facility] for 15 minutes with Min cues for expectoration of secreations.      SLP Short Term Goal 3 (Week 1): Patient will produce phrase-sentence level verbal expression with Min cues for increased vocal intensity.  SLP Short Term Goal 4 (Week 1): Patient with demonstrate basic safety awareness and problem solving by requesting help as needed with Min question cues during self-care tasks.   Of note, MSB scheduled for today; however, per MD cancelled until next week to allow for patient to recover more and get stronger in hopes of a better outcome.  Skilled Therapeutic Interventions: Skilled treatment session focused on addressing PMSV goals and education as well as dysphagia goals. SLP facilitated session by donning PMSV for 45 minutes of today's session which patient tolerated well with close supervision.  Husband present for session and SLP educated on how to donn and doff PMSV as needed; husband verbalized understanding.  Patient declined teaching and since she will likely be decannulated prior to discharge home SLP did not insist on it.  Given great tolerance today it is recommended that patient wear PMSV all waking hours that she is supervised by staff or husband; orders modified to reflect this.   Patient required Max multimodal cues for increased speech intelligibility at the word and phrase level today due to severely decreased vocal intensity due to decreased breath support. SLP also provided trials  of thin, honey-thick and pureed textures.  Patient exhibited a delayed cough response with eventual oral expectoration with ice chips 2/2 and honey-thick liquids via teaspoon 2/2 trials with Min verbal cues to cough hard which improved ability to clear suspected aspirates.  Trials of pureed textures resulted in wet vocal quality that appeared to clear with Min verbal cues for multiple swallows.  Of note during coughing episodes resulted in heart rate increasing to 102-110 but with pacing it resumed normal rang quickly following.  SLP also introduced pharyngeal strengthening exercises; recommend to reinforce them at next visit. Continue with current plan of care.   FIM:  Comprehension Comprehension Mode: Auditory Comprehension: 5-Follows basic conversation/direction: With extra time/assistive device Expression Expression Mode: Verbal Expression Assistive Devices: 6-Talk trach valve Expression: 2-Expresses basic 25 - 49% of the time/requires cueing 50 - 75% of the time. Uses single words/gestures. Social Interaction Social Interaction: 5-Interacts appropriately 90% of the time - Needs monitoring or encouragement for participation or interaction. Problem Solving Problem Solving: 4-Solves basic 75 - 89% of the time/requires cueing 10 - 24% of the time Memory Memory: 4-Recognizes or recalls 75 - 89% of the time/requires cueing 10 - 24% of the time  Pain Pain Assessment Pain Assessment: No/denies pain  Therapy/Group: Individual Therapy  Charlane Ferretti., CCC-SLP [phone]  [name] 07/14/2014, 12:49 PM

## 2014-07-14 NOTE — Progress Notes (Signed)
[place] PHYSICAL MEDICINE & REHABILITATION     PROGRESS NOTE    Subjective/Complaints: Doing better with secretions. Neck hurts, left shoulder sore. Managing secretions ok. NGT replaced and feeling better  ROS limited by cognition somewhat.   Objective: Vital Signs: Blood pressure 98/57, pulse 94, temperature 99.4 F (37.4 C), temperature source Oral, resp. rate 18, weight 64.2 kg (141 lb 8.6 oz), SpO2 98 %. Dg Abd 1 View  07/13/2014   CLINICAL DATA:  Ileus, abdominal pain  EXAM: ABDOMEN - 1 VIEW  COMPARISON:  07/12/2014  FINDINGS: Dobbhoff tube projects over the midline upper abdomen along the anticipated position of the greater curvature of the stomach with tip and the distal body of the stomach by position. Nonobstructive bowel gas pattern. No abnormally dilated loops of bowel with gas throughout small and large bowel.  IMPRESSION: Dobbhoff appears to be within the stomach. Nonobstructive bowel gas pattern.   Electronically Signed   By: Esperanza Heir M.D.   On: 07/13/2014 09:58   Dg Abd Portable 1v  07/12/2014   CLINICAL DATA:  Feeding tube placement  EXAM: PORTABLE ABDOMEN - 1 VIEW  COMPARISON:  CT 09/16/2011  FINDINGS: Feeding tube tip in the body of the stomach.  Gas is seen throughout the colon compatible with mild ileus.  IMPRESSION: Feeding tube tip in the gastric body.   Electronically Signed   By: Marlan Palau M.D.   On: 07/12/2014 16:37    Recent Labs  07/11/14 0915 07/14/14 0530  WBC 10.0 7.9  HGB 11.1* 10.4*  HCT 33.7* 31.6*  PLT 446* 398    Recent Labs  07/11/14 0915 07/14/14 0530  NA 133* 134*  K 4.0 4.0  CL 98* 96*  GLUCOSE 134* 143*  BUN 20 16  CREATININE 0.33* 0.41*  CALCIUM 8.6* 8.5*   CBG (last 3)   Recent Labs  07/13/14 1650 07/14/14 0016 07/14/14 0450  GLUCAP 100* 164* 128*    Wt Readings from Last 3 Encounters:  07/14/14 64.2 kg (141 lb 8.6 oz)  07/09/14 52.9 kg (116 lb 10 oz)  08/03/07 52.223 kg (115 lb 2.1 oz)    Physical  Exam:  Constitutional: She is oriented to person, place, and time. She appears well-developed and well-nourished.  Thin female in no acute distress  HENT: g-tube in right nare---tape/gauze adhered to tube Mid forehead hairline with resolving hematoma. Tongue with multiple fibronecrotic areas laterally.  Eyes: Pupils are equal, round, and reactive to light. Right conjunctiva is injected. Left conjunctiva is injected.  Neck:  #6 CFS in place. occasional secretions. c-collar in place  Cardiovascular: Regular rhythm. Tachycardia present.  Tachycardic with HR in 90's to 100's  Respiratory: Effort normal. No respiratory distress. She has minimal wheezes. She exhibits tenderness (Right lateral chest wall ).  GI: Soft. Bowel sounds are normal. She exhibits no distension. There is no tenderness.  Musculoskeletal: She exhibits tenderness (left shoulder. ). She exhibits no edema.  Left anterior shoulder tenderness persistent Neurological: She is alert and oriented to person, place, and time. She displays normal reflexes. She exhibits normal muscle tone.      She was able to follow basic motor commands without difficulty. Able to attend to task without difficulty today.  Attention comes and goes. Usually gestures or writes to communicate Left shoulder 1+/5 due to pain. biceps 3+/5, 2+/5 triceps, grip 3+/5 Right shoulder 3/5, biceps 3+/5, triceps 0/5, grip 2/5.  Bilateral LE: HF 4/5, KE 4/5, ADF/APF 4+.  Skin: Skin is warm and  dry.  Diffuse ecchymosis left shoulder and bilateral knees are improving. Persistent abraded area scabbed and has underlying hard hematoma on left cheek. Multiple bruises on forehead.  Psychiatric: Her affect is blunt.   Assessment/Plan: 1. 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 Physiatrist is providing close team supervision and 24 hour  management of active medical problems listed below. Physiatrist and rehab team continue to assess barriers to discharge/monitor patient progress toward functional and medical goals. FIM:    FIM - Upper Body Dressing/Undressing Upper body dressing/undressing steps patient completed:  (pt requested to keep on same shirt; stated she would wash up/change tomorrow ) Upper body dressing/undressing: 0: Activity did not occur FIM - Lower Body Dressing/Undressing Lower body dressing/undressing steps patient completed: Pull pants up/down, Thread/unthread right pants leg Lower body dressing/undressing: 2: Max-Patient completed 25-49% of tasks  FIM - Toileting Toileting steps completed by patient: Performs perineal hygiene Toileting: 2: Max-Patient completed 1 of 3 steps  FIM - Diplomatic Services operational officer Devices: Elevated toilet seat, Grab bars Toilet Transfers: 4-To toilet/BSC: Min A (steadying Pt. > 75%), 4-From toilet/BSC: Min A (steadying Pt. > 75%)  FIM - Bed/Chair Transfer Bed/Chair Transfer Assistive Devices: HOB elevated Bed/Chair Transfer: 4: Supine > Sit: Min A (steadying Pt. > 75%/lift 1 leg), 4: Bed > Chair or W/C: Min A (steadying Pt. > 75%), 4: Chair or W/C > Bed: Min A (steadying Pt. > 75%)  FIM - Locomotion: Wheelchair Locomotion: Wheelchair: 4: Travels 150 ft or more: maneuvers on rugs and over door sillls with minimal assistance (Pt.>75%) FIM - Locomotion: Ambulation Locomotion: Ambulation Assistive Devices: Other (comment) (none) Ambulation/Gait Assistance: 4: Min assist Locomotion: Ambulation: 2: Travels 50 - 149 ft with minimal assistance (Pt.>75%)  Comprehension Comprehension Mode: Auditory Comprehension: 5-Follows basic conversation/direction: With extra time/assistive device  Expression Expression Mode: Verbal Expression Assistive Devices: 6-Talk trach valve Expression: 3-Expresses basic 50 - 74% of the time/requires cueing 25 - 50% of the time.  Needs to repeat parts of sentences.  Social Interaction Social Interaction: 4-Interacts appropriately 75 - 89% of the time - Needs redirection for appropriate language or to initiate interaction.  Problem Solving Problem Solving: 4-Solves basic 75 - 89% of the time/requires cueing 10 - 24% of the time  Memory Memory: 4-Recognizes or recalls 75 - 89% of the time/requires cueing 10 - 24% of the time  Medical Problem List and Plan: 1. 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 DVT Prophylaxis/Anticoagulation: Mechanical: Sequential compression devices, below knee Bilateral lower extremities---dopplers negative 3. Headaches/ Pain Management: Continue Duragesic patch with hydrocodone prn.  -consider topamax for headaches if persistent---no issues at present 4. Mood: LCSW to follow for support and evaluation as cognition/ability to communicate improves. 5. Neuropsych: This patient is not fully capable of making decisions on her own behalf. 6. Skin/Wound Care: Routine pressure relief measures.  7. Fluids/Electrolytes/Nutrition: Monitor I/O. Continue tube feeds with increased water flushes.     -labs holding steady today.  8. VDRF:  -PMV trials  -did well with downsize to #4---coughing secretions up through mouth now   -hopeful that we can remove early next week. (tuesday or Wednesday)  -NGT remains in place for dysphagia   -would prefer holding on MBS until trach is out (spoke with Fae Pippin)   -  can continue bedside trials for now 9. C2 nondisplaced fracture including type 3 odontoid fracture: Continue Cervical collar for support.  10. ABLA: Resolving. hgb 10.4---no signs of blood loss---recheck early next week 11. Leucocytosis: Monitor for signs of infection. cbc pending  12. Hyperglycemia: Likely due to stress as well as tube feeds. Will check Hgb A1C and  add SSI to help manage BS.  13. Enterobacter PNA: ceftriaxone thru today 14. Urinary retention: Continue foley for now  -ua equivocal  -ucx with 100k enterococcus (sens pending)---bean empiric amoxil on Thursday   15. Distal left clavicle fx---place in shoulder sling, pain control,   -may participate in basic self care tasks with LUE, avoid use of left arm for transfers at this point 16. Fibronecrotic tongue ulcers:  Continue aggressive oral care with hydrogen perioxide tid as well as Peridex rinses.  -improving 17. ?mild ileus---enema with results this week. Abdomen benign   LOS (Days) 4 A FACE TO FACE EVALUATION WAS PERFORMED  Yuka Lallier T 07/14/2014 8:33 AM

## 2014-07-14 NOTE — Progress Notes (Signed)
Physical Therapy Session Note  Patient Details  Name: Tiffany Stevens MRN: [id number] Date of Birth: 02-Jun-1955  Today's Date: 07/14/2014 PT Individual Time:  -  1500-1600 Treatment Time: 60 min     Short Term Goals: Week 1:  PT Short Term Goal 1 (Week 1): = LTGs of overall supervision  Skilled Therapeutic Interventions/Progress Updates:    Gait Training: PT instructs pt in ambulation with SPC req min-mod A for balance and repeated verbal cues for 2 point gait pattern, but pt does not follow this. PT asks pt if [facility] hinders or helps her and she reports it helps her. PT instructs pt in ambulation without AD x 150' req min A.  PT instructs pt in ascending/descending one corner flight of stairs with R arm rail req min-mod A and seated rest brake halfway through.   Therapeutic Activity: Pt received sitting eob with RN. PT instructs pt in sit to supine with HOB elevated transfer req CGA, and req min A supine with HOB elevated to achieve eob. Pt c/o dizziness during sit to supine, which passes in 30 sec-1 minute. PT instructs pt in sit to R side lie to/from supine transfer on mat req min A.   Therapeutic Exercise: PT instructs pt in B LE strengthening and motor control exercises: 2# ankle weights heel slides and 2# supine hip abduction/adduction.   Neuromuscular Reeducation: In supine, PT instructs pt in R shoulder depression exercise: passing golf ball to PT. PT then instructs pt in upright posture using rolling wall mirror for visual feedback - pt demonstrates ability to improve posture almost to midline.   Pt req encouragement to participate in PT, but gradually improves as the session continues. Pt may benefit from a smaller aspen c-collar - RN notified. Continue per PT POC.   Therapy Documentation Precautions:  Precautions Precautions: Cervical, Fall Precaution Comments: NG tube Required Braces or Orthoses: Cervical Brace, Sling Cervical Brace: Hard collar, At all  times Restrictions Weight Bearing Restrictions: Yes Pain: Pain Assessment Pain Assessment: 0-10 Pain Score: 7  Pain Type: Acute pain Pain Location: Head Pain Orientation: Right Pain Descriptors / Indicators: Aching Pain Onset: On-going Pain Intervention(s): Medication (See eMAR) Multiple Pain Sites: Yes 2nd Pain Site Pain Score: 7 Pain Type: Acute pain;Surgical pain Pain Location: Neck Pain Descriptors / Indicators: Aching Pain Onset: On-going Pain Intervention(s): Medication (See eMAR)  See FIM for current functional status  Therapy/Group: Individual Therapy  Marvine Encalade M 07/14/2014, 8:51 AM

## 2014-07-14 NOTE — Progress Notes (Signed)
Occupational Therapy Session Note  Patient Details  Name: Tiffany Stevens MRN: [id number] Date of Birth: Sep 14, 1955  Today's Date: 07/14/2014 OT Individual Time: 1100-1155 and 1400-1500 OT Individual Time Calculation (min): 55 min and 60 min   Short Term Goals: Week 1:  OT Short Term Goal 1 (Week 1): Pt will complete UB dressing with min A to increase functional independence. OT Short Term Goal 2 (Week 1): Pt will complete LB dressing with min A to increase functional independence.  OT Short Term Goal 3 (Week 1): Pt will complete sit<>stand transfers at close supervision level to increase functional independence.  OT Short Term Goal 4 (Week 1): Pt will engage in functional activity in standing for 3 minutes to increase functional activity tolerance.   Skilled Therapeutic Interventions/Progress Updates:    Session One: Pt seen for OT therapy session focusing on functional activity tolerance, standing balance, and bed mobility. Pt in supine upon arrival finishing toileting task with nursing. Pt transferred to EOB with min A and VCs for log rolling technique and cues to not use L UE during transfer. Upon sitting EOB, pt voiced dizziness, however this disapated following 1-2 minutes of upright sitting. Pt transferred to w/c with HHA. Pt taken to therapy gym total A for time and energy conservation. Pt completed horse shoe toss in standing with CGA, reaching into all planes to obtain horse shoe and tossing with R UE. Pt required min steadying assist when turning to obtain horseshoe from behind her. Pt tolerated 1-2 minutes of standing before requesting seated rest break. Task completed x3 trials. Kinesiotape applied to pt's L trapezius to facilitate upright head position. Pt then completed simulated bed mobility task on therapy mat. Pt transferred sitting EOM> supine with supervision. Pt required verbal and tactile cues for log rolling technique. Inititally pt required heavy min A at trunk to come into  upright tsitting on first trial progressing to close supervision with cues on last trial. At end of session, pt ambulated pushing w/c with min steadying assist and VCs to slow down, ambulating from gym back to room without s/s of fatigue. Pt's O2 level at 97% following ambulation.  Pt's O2 level remained >93% throughout duration of hour long session. Pt and caregivers education regarding pulse ox, O2 levels, deep breathing techniques, and d/c planning.    Session Two: Pt seen for OT session focusing on functional activity tolerance and ADL re-training. Pt in supine upon arrival, agreeable to tx with encouragement. Pt transferred supine> EOB with assist, demonstrating little carry over of bed mobility techniques taught in previous session. Pt ambulated throughout unit pushing w/c and with steadying assist and VCs for safety. Pt slightly impulsive with mobility. In ADL apartment, pt completed bed mobility on standard mattress with overall min steadying assist. Pt educated regarding importance of not using L UE during functional transfers, however, cont to use L UE to assist with mobility. Pt ambulated to therapy gym with HHA, upon arrival to gym pt voiced desire to  Complete toileting task. Pt ambulated back to room with HHA and completed transfer on/off toilet with min A. Pt required assist for hygiene. Pt stood with steadying assist to complete hygiene, pt voiced that she couldn't complete hygiene, however she demonstrated ability to complete hygiene in standing with steadying assist. Assist provided for thouroughness as pt did not believe she did an adequate job. Pt ambulated out of bathroom and completed hand hygiene at the sink displaying no regards to therapists VCs for safety. Pt returned  to EOB and completed UE shoulder shrugs and scapular protraction x10 reps each. Pt left sitting EOB at end of session, RN and husband present.    Therapy Documentation Precautions:  Precautions Precautions: Cervical,  Fall Precaution Comments: NG tube Required Braces or Orthoses: Cervical Brace, Sling Cervical Brace: Hard collar, At all times Restrictions Weight Bearing Restrictions: Yes Pain: Pain Assessment Pain Score: 7  Pain Type: Acute pain Pain Location: Head Pain Descriptors / Indicators: Aching Pain Intervention(s): Medication (See eMAR); Repositioned; Ambulation/ increased activity    See FIM for current functional status  Therapy/Group: Individual Therapy  Lewis, Durant Scibilia C 07/14/2014, 7:18 AM

## 2014-07-15 ENCOUNTER — Inpatient Hospital Stay (HOSPITAL_COMMUNITY): Payer: 59

## 2014-07-15 ENCOUNTER — Inpatient Hospital Stay (HOSPITAL_COMMUNITY): Payer: Self-pay | Admitting: Occupational Therapy

## 2014-07-15 ENCOUNTER — Inpatient Hospital Stay (HOSPITAL_COMMUNITY): Payer: Self-pay | Admitting: Speech Pathology

## 2014-07-15 ENCOUNTER — Encounter (HOSPITAL_COMMUNITY): Payer: Self-pay | Admitting: Occupational Therapy

## 2014-07-15 LAB — GLUCOSE, CAPILLARY
GLUCOSE-CAPILLARY: 107 mg/dL — AB (ref 65–99)
Glucose-Capillary: 117 mg/dL — ABNORMAL HIGH (ref 65–99)
Glucose-Capillary: 137 mg/dL — ABNORMAL HIGH (ref 65–99)
Glucose-Capillary: 151 mg/dL — ABNORMAL HIGH (ref 65–99)
Glucose-Capillary: 159 mg/dL — ABNORMAL HIGH (ref 65–99)

## 2014-07-15 NOTE — Progress Notes (Signed)
Speech Language Pathology Daily Session Note  Patient Details  Name: LOZA BONDER MRN: [id number] Date of Birth: 10-02-1955  Today's Date: 07/15/2014 SLP Individual Time: 1118-1203 SLP Individual Time Calculation (min): 45 min  Short Term Goals: Week 1: SLP Short Term Goal 1 (Week 1): Patient will consume ice chips with no more than 3 swallows per trial to demonstrate readiness for objective assessment.  SLP Short Term Goal 2 (Week 1): Patient will demonstrate toleration of PMSV with vitals remaining [facility] for 15 minutes with Min cues for expectoration of secreations.      SLP Short Term Goal 3 (Week 1): Patient will produce phrase-sentence level verbal expression with Min cues for increased vocal intensity.  SLP Short Term Goal 4 (Week 1): Patient with demonstrate basic safety awareness and problem solving by requesting help as needed with Min question cues during self-care tasks.   Skilled Therapeutic Interventions: Skilled treatment session focused on swallowing and cognitive goals.  SLP facilitated session by providing Supervision for pharyngeal swallowing exercises; patient did well with the effortful and supraglottic swallow exercises, but had overt difficulty performing the Masako and modified Masako exercise.  She displayed overt s/s of aspiration with Coleen trials of 2 ice chips characterized by immediate weak coughing.  She provided reasoning to why she has more secretions in the morning than throughout the day while she is mobile as well as why she is unable to lie in bed at a 180 degree angle with mod A that faded to min A as SLP continued to present same situation multiple times during session. Continue with current plan of care.   FIM:  Comprehension Comprehension Mode: Auditory Comprehension: 5-Follows basic conversation/direction: With extra time/assistive device Expression Expression Mode: Verbal Expression: 2-Expresses basic 25 - 49% of the time/requires cueing 50 - 75% of the  time. Uses single words/gestures. Social Interaction Social Interaction: 4-Interacts appropriately 75 - 89% of the time - Needs redirection for appropriate language or to initiate interaction. Problem Solving Problem Solving: 4-Solves basic 75 - 89% of the time/requires cueing 10 - 24% of the time Memory Memory: 4-Recognizes or recalls 75 - 89% of the time/requires cueing 10 - 24% of the time FIM - Eating Eating Activity: 4: Helper occasionally scoops food on utensil  Pain Reported 0/10 pain  Therapy/Group: Individual Therapy  Cranford Mon, MA CCC-SLP Cranford Mon A 07/15/2014, 3:29 PM

## 2014-07-15 NOTE — Progress Notes (Signed)
Occupational Therapy Session Note  Patient Details  Name: Tiffany Stevens MRN: [id number] Date of Birth: 1955-06-10  Today's Date: 07/15/2014 OT Individual Time: 1500-1530 OT Individual Time Calculation (min): 30 min    Short Term Goals: Week 1:  OT Short Term Goal 1 (Week 1): Pt will complete UB dressing with min A to increase functional independence. OT Short Term Goal 2 (Week 1): Pt will complete LB dressing with min A to increase functional independence.  OT Short Term Goal 3 (Week 1): Pt will complete sit<>stand transfers at close supervision level to increase functional independence.  OT Short Term Goal 4 (Week 1): Pt will engage in functional activity in standing for 3 minutes to increase functional activity tolerance.   Skilled Therapeutic Interventions/Progress Updates:    1:1 focus on functional ambulation around dayroom focus on dynamic balance, selective attention, work memory and recall of sequence of 3 while walking in a patterning laid on the floor with cards. (had to go from black, red, red card while naming the card. Pt required min A for balance and able to complete in a timely manner. Continued to work on balance while kicking a beach ball down the hall with control and changing directions constantly. No c/o dizziness. Returned to bathroom at pt's request and left with nursing and husband. Pt able to recall one item from earlier session with mod cuing.   Therapy Documentation Precautions:  Precautions Precautions: Cervical, Fall Precaution Comments: NG tube Required Braces or Orthoses: Cervical Brace, Sling Cervical Brace: Hard collar, At all times Restrictions Weight Bearing Restrictions: Yes LUE Weight Bearing: Non weight bearing Pain: Pain Assessment Pain Assessment: No/denies pain Pain Score: 5  Pain Location: Head Pain Orientation: Anterior Pain Intervention(s): Medication (See eMAR) Multiple Pain Sites: No  See FIM for current functional  status  Therapy/Group: Individual Therapy  [name] [facility] 07/15/2014, 4:21 PM

## 2014-07-15 NOTE — Progress Notes (Signed)
Physical Therapy Session Note  Patient Details  Name: Tiffany Stevens MRN: [id number] Date of Birth: 1955-02-15  Today's Date: 07/15/2014 PT Individual Time:1405  - 1505, 60 min     Short Term Goals: Week 1:  PT Short Term Goal 1 (Week 1): = LTGs of overall supervision    Skilled Therapeutic Interventions/Progress Updates:   tx focused on neuro re-ed via demo, manual cues, forced use for core strengthening, R and L stance stability , trunk shortening/lengthening, pelvic dissociation in supported> unsupported sitting; bil knee ext in sitting in w/c while PT transported pt in w/c; [place] A exs in standing- 10 x 1 each hamstring curls, calf raises (c/o R toe pain), toe raise, hip abduction, mini squats; SLS during step/taps onto Yoga block x 10 each L and R, gait without AD x 150' on carpet, min assist  therapeutic activities for : hip abduction activation for SLS stability in nudging and kicking Yoga block laterally L and R, stance stability during gait over carpet, kicking beach ball L and R feet x 60' with mod assist for balance; reaching within BOS to L and R, grasping cards (open chain LUE use to prevent wt bearing)  Pt resting in w/c at end of session, passed to OT for next therapy session.  Pt demonstrated poor safety awareness by impulsivity in sit> stand before requested, and sitting in w/c,  which was unlocked, despite PT cueing pt repeatedly to wait.    Therapy Documentation Precautions:  Precautions Precautions: Cervical, Fall Precaution Comments: NG tube Required Braces or Orthoses: Cervical Brace, Sling Cervical Brace: Hard collar, At all times Restrictions Weight Bearing Restrictions: Yes (NWB to left shoulder)   Vital Signs:  O2 Device: Tracheostomy Collar, room air  Pain: Pain Assessment Pain Assessment: 0-10 Pain Score: 5 Pain Type: Acute pain Pain Location: Head Pain Intervention(s): Medication (See eMAR)      See FIM for current functional  status  Therapy/Group: Individual Therapy  [name] 07/15/2014, 8:24 AM

## 2014-07-15 NOTE — Progress Notes (Signed)
[place] PHYSICAL MEDICINE & REHABILITATION     PROGRESS NOTE    Subjective/Complaints:  neck pulls to the right per husband concerned that brace not tight enough Pain well-controlled per patient Patient needs encouragement to talk via the Passy-Muir valve Secretions are thin patient able to cough  ROS limited by cognition , denies pain or breathing difficulty  Objective: Vital Signs: Blood pressure 106/53, pulse 90, temperature 98.6 F (37 C), temperature source Oral, resp. rate 18, weight 64.2 kg (141 lb 8.6 oz), SpO2 100 %. No results found.  Recent Labs  07/14/14 0530  WBC 7.9  HGB 10.4*  HCT 31.6*  PLT 398    Recent Labs  07/14/14 0530  NA 134*  K 4.0  CL 96*  GLUCOSE 143*  BUN 16  CREATININE 0.41*  CALCIUM 8.5*   CBG (last 3)   Recent Labs  07/14/14 1701 07/15/14 0001 07/15/14 0600  GLUCAP 110* 159* 151*    Wt Readings from Last 3 Encounters:  07/15/14 64.2 kg (141 lb 8.6 oz)  07/09/14 52.9 kg (116 lb 10 oz)  08/03/07 52.223 kg (115 lb 2.1 oz)    Physical Exam:  Constitutional: She is oriented to person, place, and time. She appears well-developed and well-nourished.  Thin female in no acute distress  HENT: g-tube in right nare---tape/gauze adhered to tube Left zygomatic arch less swollen no ecchymosis Tongue with multiple fibronecrotic areas laterally.  Eyes: Pupils are equal, round, and reactive to light. Right conjunctiva is injected. Left conjunctiva is injected.  Neck:  #4 CFS in place. occasional secretions. c-collar in place  Cardiovascular: Regular rhythm. Tachycardia present.  Tachycardic with HR in 90's to 100's  Respiratory: Effort normal. No respiratory distress. She has minimal wheezes. She exhibits tenderness (Right lateral chest wall ).  GI: Soft. Bowel sounds are normal. She exhibits no distension. There is no tenderness.  Musculoskeletal: She exhibits tenderness (left shoulder. ). She exhibits no edema.  Left anterior  shoulder tenderness persistent Neurological: She is alert and oriented to person, place, and time. She displays normal reflexes. She exhibits normal muscle tone.      She was able to follow basic motor commands without difficulty. Able to attend to task without difficulty today.  Attention comes and goes. Usually gestures or writes to communicate Left shoulder 1+/5 due to pain. biceps 3+/5, 2+/5 triceps, grip 3+/5 Right shoulder 3/5, biceps 3+/5, triceps 0/5, grip 2/5.  Bilateral LE: HF 4/5, KE 4/5, ADF/APF 4+.  Skin: Skin is warm and dry.  Diffuse ecchymosis left shoulder and bilateral knees are improving. Persistent abraded area scabbed and has underlying hard hematoma on left cheek. Multiple bruises on forehead.  Psychiatric: Her affect is blunt.   Assessment/Plan: 1. 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 Physiatrist is providing close team supervision and 24 hour management of active medical problems listed below. Physiatrist and rehab team continue to assess barriers to discharge/monitor patient progress toward functional and medical goals.  Long discussion with patient and husband as well as OT, readjusted cervical orthosis appears to be fitting well FIM: FIM - Bathing Bathing Steps Patient Completed: Front perineal area, Buttocks, Right upper leg, Left upper leg Bathing: 4: Min-Patient completes 8-9 40f 10 parts or 75+ percent  FIM - Upper Body Dressing/Undressing Upper body dressing/undressing steps patient completed:  (pt requested to keep on same shirt; stated she would wash up/change  tomorrow ) Upper body dressing/undressing: 0: Activity did not occur FIM - Lower Body Dressing/Undressing Lower body dressing/undressing steps patient completed: Don/Doff right sock, Don/Doff left sock Lower body dressing/undressing: 5: Supervision: Safety  issues/verbal cues  FIM - Toileting Toileting steps completed by patient: Adjust clothing prior to toileting, Performs perineal hygiene, Adjust clothing after toileting Toileting Assistive Devices: Grab bar or rail for support Toileting: 4: Steadying assist  FIM - Diplomatic Services operational officer Devices: Grab bars Toilet Transfers: 4-To toilet/BSC: Min A (steadying Pt. > 75%), 4-From toilet/BSC: Min A (steadying Pt. > 75%)  FIM - Banker Devices: Orthosis, HOB elevated, Cane Bed/Chair Transfer: 4: Supine > Sit: Min A (steadying Pt. > 75%/lift 1 leg), 4: Sit > Supine: Min A (steadying pt. > 75%/lift 1 leg), 4: Bed > Chair or W/C: Min A (steadying Pt. > 75%), 4: Chair or W/C > Bed: Min A (steadying Pt. > 75%)  FIM - Locomotion: Wheelchair Locomotion: Wheelchair: 0: Activity did not occur FIM - Locomotion: Ambulation Locomotion: Ambulation Assistive Devices: Emergency planning/management officer Ambulation/Gait Assistance: 3: Mod assist Locomotion: Ambulation: 3: Travels 150 ft or more with moderate assistance (Pt: 50 - 74%)  Comprehension Comprehension Mode: Auditory Comprehension: 5-Follows basic conversation/direction: With extra time/assistive device  Expression Expression Mode: Verbal Expression Assistive Devices: 6-Communication board Expression: 3-Expresses basic 50 - 74% of the time/requires cueing 25 - 50% of the time. Needs to repeat parts of sentences.  Social Interaction Social Interaction: 4-Interacts appropriately 75 - 89% of the time - Needs redirection for appropriate language or to initiate interaction.  Problem Solving Problem Solving: 4-Solves basic 75 - 89% of the time/requires cueing 10 - 24% of the time  Memory Memory: 4-Recognizes or recalls 75 - 89% of the time/requires cueing 10 - 24% of the time  Medical Problem List and Plan: 1. 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 DVT Prophylaxis/Anticoagulation: Mechanical: Sequential compression devices, below knee Bilateral lower extremities---dopplers negative 3. Headaches/ Pain Management: Continue Duragesic patch with hydrocodone prn.  -consider topamax for headaches if persistent---no issues at present 4. Mood: LCSW to follow for support and evaluation as cognition/ability to communicate improves. 5. Neuropsych: This patient is not fully capable of making decisions on her own behalf. 6. Skin/Wound Care: Routine pressure relief measures.  7. Fluids/Electrolytes/Nutrition: Monitor I/O. Continue tube feeds with increased water flushes.     -labs holding steady today.  8. VDRF:  -PMV trials  -did well with downsize to #4---coughing secretions up through mouth now   -hopeful that we can remove early next week. (tuesday or Wednesday)  -NGT remains in place for dysphagia   -would prefer holding on MBS until trach is out (spoke with Fae Pippin)   -can continue bedside trials for now 9. C2 nondisplaced fracture including type 3 odontoid fracture: Continue Cervical collar for support.  10. ABLA: Resolving. hgb 10.4---no signs of blood loss---recheck early next week 11. Leucocytosis: Monitor for signs of infection. cbc pending  12. Hyperglycemia: Likely due to stress as well as tube feeds. Will check Hgb A1C and add SSI to help manage BS.  13. Enterobacter PNA: ceftriaxone thru today 14. Urinary retention: Continue foley for now  -ua equivocal  -ucx with 100k enterococcus (sens pending)---bean empiric amoxil on Thursday   15. Distal left clavicle fx---place in shoulder sling, pain control, no pain to palpation over  this area  -may participate in basic self care tasks with LUE, avoid use of left arm for transfers at this point 16. Fibronecrotic tongue ulcers:  Continue aggressive oral care with hydrogen perioxide tid as well as Peridex  rinses.  -improving 17. ?mild ileus---enema with results this week. Abdomen benign   LOS (Days) 5 A FACE TO FACE EVALUATION WAS PERFORMED  Erick Colace 07/15/2014 9:55 AM

## 2014-07-15 NOTE — Progress Notes (Signed)
Occupational Therapy Session Note  Patient Details  Name: Tiffany Stevens MRN: [id number] Date of Birth: 05-11-1955  Today's Date: 07/15/2014 OT Individual Time: 1000-1115 OT Individual Time Calculation (min): 75 min    Skilled Therapeutic Interventions/Progress Updates:    1:1 self care retraining at sink level including bed mobility with recall UE precautions, functional ambulation around room with min A to obtain items for bathing and dressing at sink. Pt required mod questioning cues for task organization and for thoroughness.  Pt collar was tighten today, which appears to have helped with head positioning at midline and better support (MD performed in assessment during session.) Pt able to recall 25% of exercises SLP issued on Friday and required step by step demonstrative cuing to follow through along side of therapist. Pt's husband reports she will not "do the exercises with him." Pt does demonstrate low frustration tolerance with him in session. Pt participated in oral care but wanted therapist to perform due to pain around and on tongue and to perform with sponge not suction brush. Discussed vestibular deficits and ongoing dizziness with positional changes with pt and pt's husband. Pt dizziness in the session with functional activity did not exceed  A 4/10 but does require her to stop the activity and take a minute. Performed functional tasks around room (home making tasks) to encourage system integration for vestibular system with turning (quick, slow and in 360), being down, looking up etc. Pt performed hand strengthening with foam block during rest breaks. Review of goals and progress with husband during session. Pt did demonstrate lean to the left with functional ambulation in the hallway more than in the room ?? More fatigue. And required min cuing and reassurance for returning to correct room.   Therapy Documentation Precautions:  Precautions Precautions: Cervical, Fall Precaution  Comments: NG tube Required Braces or Orthoses: Cervical Brace, Sling Cervical Brace: Hard collar, At all times Restrictions Weight Bearing Restrictions: Yes LUE Weight Bearing: Non weight bearing  Pain:  c/o 5/10 pain in neck and head - RN gave meds at beginning of session.   See FIM for current functional status  Therapy/Group: Individual Therapy  [name] [facility] 07/15/2014, 12:09 PM

## 2014-07-16 ENCOUNTER — Inpatient Hospital Stay (HOSPITAL_COMMUNITY): Payer: Self-pay | Admitting: Rehabilitation

## 2014-07-16 DIAGNOSIS — S14155S Other incomplete lesion at C5 level of cervical spinal cord, sequela: Secondary | ICD-10-CM

## 2014-07-16 LAB — GLUCOSE, CAPILLARY
GLUCOSE-CAPILLARY: 223 mg/dL — AB (ref 65–99)
Glucose-Capillary: 137 mg/dL — ABNORMAL HIGH (ref 65–99)
Glucose-Capillary: 153 mg/dL — ABNORMAL HIGH (ref 65–99)
Glucose-Capillary: 174 mg/dL — ABNORMAL HIGH (ref 65–99)
Glucose-Capillary: 90 mg/dL (ref 65–99)

## 2014-07-16 MED ORDER — JEVITY 1.2 CAL PO LIQD
1000.0000 mL | ORAL | Status: AC
  Filled 2014-07-16: qty 1000

## 2014-07-16 MED ORDER — POLYVINYL ALCOHOL 1.4 % OP SOLN
1.0000 [drp] | OPHTHALMIC | Status: DC | PRN
  Administered 2014-07-22: 1 [drp] via OPHTHALMIC
  Filled 2014-07-16: qty 15

## 2014-07-16 NOTE — Progress Notes (Signed)
PRN xanax and trazodone given at 2031 per patient and husbands request. Reports feeling afraid. Using dry erase board to communicate. Encouraged patient to verbalize needs. Pain managed with PRN Hycet, complains of HA. Jevity infusing via [place], without problems. O2 28% via TC. Coughing up secretions from mouth and using [place]. Aspen collar in place. Getting up to [facility] with assist to void. Mouth care provided per orders. Family at bedside. Alfredo Martinez A

## 2014-07-16 NOTE — Progress Notes (Signed)
[place] PHYSICAL MEDICINE & REHABILITATION     PROGRESS NOTE    Subjective/Complaints: Chief complaint hazy vision right eye More talkative this morning. Denies double vision. Daughter at bedside. Daughter is also concerned about hand weakness on the right side  ROS limited by cognition , denies pain or breathing difficulty  Objective: Vital Signs: Blood pressure 108/60, pulse 93, temperature 98.6 F (37 C), temperature source Oral, resp. rate 16, weight 70.2 kg (154 lb 12.2 oz), SpO2 98 %. No results found.  Recent Labs  07/14/14 0530  WBC 7.9  HGB 10.4*  HCT 31.6*  PLT 398    Recent Labs  07/14/14 0530  NA 134*  K 4.0  CL 96*  GLUCOSE 143*  BUN 16  CREATININE 0.41*  CALCIUM 8.5*   CBG (last 3)   Recent Labs  07/15/14 2004 07/16/14 07/16/14 0658  GLUCAP 107* 153* 223*    Wt Readings from Last 3 Encounters:  07/16/14 70.2 kg (154 lb 12.2 oz)  07/09/14 52.9 kg (116 lb 10 oz)  08/03/07 52.223 kg (115 lb 2.1 oz)    Physical Exam:  Constitutional: She is oriented to person, place, and time. She appears well-developed and well-nourished.  Thin female in no acute distress  HENT: g-tube in right nare---tape/gauze adhered to tube Left zygomatic arch less swollen no ecchymosis Tongue with multiple fibronecrotic areas laterally.  Eyes: Pupils are equal, round, and reactive to light. Right conjunctiva is injected. Left conjunctiva is injected.  Neck:  #4 CFS in place. occasional secretions. c-collar in place  Cardiovascular: Regular rhythm. Tachycardia present.  Tachycardic with HR in 90's to 100's  Respiratory: Effort normal. No respiratory distress. She has minimal wheezes. She exhibits tenderness (Right lateral chest wall ).  GI: Soft. Bowel sounds are normal. She exhibits no distension. There is no tenderness.  Musculoskeletal: She exhibits tenderness (left shoulder. ). She exhibits no edema.  Left anterior shoulder tenderness  persistent Neurological: She is alert and oriented to person, place, and time. She displays normal reflexes. She exhibits normal muscle tone.      She was able to follow basic motor commands without difficulty. Able to attend to task without difficulty today.  Attention comes and goes. Usually gestures or writes to communicate Left shoulder 1+/5 due to pain. biceps 3+/5, 2+/5 triceps, grip 3+/5 Right shoulder 3/5, biceps 3+/5, triceps 0/5, grip 2/5.  Bilateral LE: HF 4/5, KE 4/5, ADF/APF 4+.  Skin: Skin is warm and dry.  Diffuse ecchymosis left shoulder and bilateral knees are improving. Persistent abraded area scabbed and has underlying hard hematoma on left cheek. Multiple bruises on forehead.  Psychiatric: Her affect is blunt.   Assessment/Plan: 1. 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 Physiatrist is providing close team supervision and 24 hour management of active medical problems listed below. Physiatrist and rehab team continue to assess barriers to discharge/monitor patient progress toward functional and medical goals.  Long discussion with patient, husband as well as daughter in regards to visual complaints. Appears to be dry eye. Start drops. In regards to right upper extremity weakness could be explained by either traumatic brain injury in the left frontal area or spinal cord injury C6-7 with decreased disc space on the right side compared to the left side FIM: FIM - Bathing Bathing Steps Patient Completed: Front perineal area, Buttocks, Right upper leg, Left upper leg Bathing: 4: Min-Patient  completes 8-9 30f 10 parts or 75+ percent  FIM - Upper Body Dressing/Undressing Upper body dressing/undressing steps patient completed:  (pt requested to keep on same shirt; stated she would wash up/change tomorrow ) Upper body dressing/undressing: 0:  Activity did not occur FIM - Lower Body Dressing/Undressing Lower body dressing/undressing steps patient completed: Don/Doff right sock, Don/Doff left sock Lower body dressing/undressing: 5: Supervision: Safety issues/verbal cues  FIM - Toileting Toileting steps completed by patient: Adjust clothing prior to toileting, Performs perineal hygiene, Adjust clothing after toileting Toileting Assistive Devices: Grab bar or rail for support Toileting: 4: Steadying assist  FIM - Diplomatic Services operational officer Devices: Grab bars Toilet Transfers: 4-To toilet/BSC: Min A (steadying Pt. > 75%), 4-From toilet/BSC: Min A (steadying Pt. > 75%)  FIM - Banker Devices: Orthosis, HOB elevated, Cane Bed/Chair Transfer: 4: Chair or W/C > Bed: Min A (steadying Pt. > 75%), 4: Bed > Chair or W/C: Min A (steadying Pt. > 75%)  FIM - Locomotion: Wheelchair Locomotion: Wheelchair: 1: Total Assistance/staff pushes wheelchair (Pt<25%) FIM - Locomotion: Ambulation Locomotion: Ambulation Assistive Devices: Other (comment) (none) Ambulation/Gait Assistance: 4: Min assist Locomotion: Ambulation: 4: Travels 150 ft or more with minimal assistance (Pt.>75%)  Comprehension Comprehension Mode: Auditory Comprehension: 5-Follows basic conversation/direction: With extra time/assistive device  Expression Expression Mode: Verbal Expression Assistive Devices: 6-Communication board Expression: 2-Expresses basic 25 - 49% of the time/requires cueing 50 - 75% of the time. Uses single words/gestures.  Social Interaction Social Interaction: 4-Interacts appropriately 75 - 89% of the time - Needs redirection for appropriate language or to initiate interaction.  Problem Solving Problem Solving: 4-Solves basic 75 - 89% of the time/requires cueing 10 - 24% of the time  Memory Memory: 4-Recognizes or recalls 75 - 89% of the time/requires cueing 10 - 24% of the time  Medical  Problem List and Plan: 1. 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 DVT Prophylaxis/Anticoagulation: Mechanical: Sequential compression devices, below knee Bilateral lower extremities---dopplers negative 3. Headaches/ Pain Management: Continue Duragesic patch with hydrocodone prn.  -consider topamax for headaches if persistent---no issues at present 4. Mood: LCSW to follow for support and evaluation as cognition/ability to communicate improves. 5. Neuropsych: This patient is not fully capable of making decisions on her own behalf. 6. Skin/Wound Care: Routine pressure relief measures.  7. Fluids/Electrolytes/Nutrition: Monitor I/O. Continue tube feeds with increased water flushes.     -labs holding steady today.  8. VDRF:  -PMV trials  -did well with downsize to #4---coughing secretions up through mouth   -probably remove early next week. (tuesday or Wednesday)  -NGT remains in place for dysphagia   -would prefer holding on MBS until trach is out (spoke with Fae Pippin)   -can continue bedside trials for now 9. C2 nondisplaced fracture including type 3 odontoid fracture: Continue Cervical collar for support.  10. ABLA: Resolving. hgb 10.4---no signs of blood loss---recheck early next week 11. Leucocytosis: Monitor for signs of infection. cbc pending  12. Hyperglycemia: Likely due to stress as well as tube feeds. Will check Hgb A1C and add SSI to help manage BS.  13. Enterobacter PNA: ceftriaxone thru today 14. Urinary retention: Continue foley for now  -ua equivocal  -ucx with 100k enterococcus (sens pending)---bean empiric amoxil on Thursday   15. Distal left clavicle fx---place in shoulder sling, pain control, no pain to palpation over  this area  -may participate in basic self care tasks with LUE, avoid use of left arm for transfers at this point 16.  Fibronecrotic tongue ulcers:  Continue aggressive oral care with hydrogen perioxide tid as well as Peridex rinses.  -improving    LOS (Days) 6 A FACE TO FACE EVALUATION WAS PERFORMED  KIRSTEINS,ANDREW E 07/16/2014 10:06 AM

## 2014-07-16 NOTE — Progress Notes (Signed)
Nutrition Brief Note  RD paged by RN regarding pt's husband having concerns about pt's TF regimen. Per husband, pt has had some vomiting. Pt's husband would like TF rate to be decreased to see if symptoms will improve.  RD advised RN to decrease TF rate to half (35 ml/hr), as tolerated TF rate to be increased to goal by 6/27. RD will follow-up 6/27.  Wt Readings from Last 15 Encounters:  07/16/14 154 lb 12.2 oz (70.2 kg)  07/09/14 116 lb 10 oz (52.9 kg)  08/03/07 115 lb 2.1 oz (52.223 kg)    Body mass index is 27.42 kg/(m^2). Patient meets criteria for overweight based on current BMI.   Labs and medications reviewed.   If other nutrition issues arise, please consult RD.   Tilda Franco, MS, RD, LDN Pager: [phone] After Hours Pager: [phone]

## 2014-07-16 NOTE — Progress Notes (Signed)
Physical Therapy Session Note  Patient Details  Name: Tiffany Stevens MRN: [id number] Date of Birth: [birth year]  Today's Date: 07/16/2014 PT Individual Time: 1100-1145 PT Individual Time Calculation (min): 45 min   Short Term Goals: Week 1:  PT Short Term Goal 1 (Week 1): = LTGs of overall supervision  Skilled Therapeutic Interventions/Progress Updates:   Pt received lying in bed, family and husband present during session to observe and assist as needed.  Assisted pt with donning pants while still in bed as she needed to use restroom.  Family member in room asking if she could be checked off with toilet transfers.  Pt wanting to ambulate to restroom and family not comfortable with this, therefore did not educate at this time.  Also provided education that this may be difficult due to lines/tubes at this time.  All verbalized understanding.   Pt ambulated to/from restroom with R HHA min A with cues for safety and decreased impulsivity.  Pt able to void and while on toilet with large amount of secretions.  She was able to cough up without use of suction.  Ambulated to sink to wash/dry hands at min A level before ambulating towards therapy gym.  Upon getting to nursing station, nurse tech wanting to check blood sugar, therefore pt stood x 2 mins as nurse tech did this with min A and mod cues for safety.  Pt then stating needing to sit as she felt sick, therefore family had w/c and assisted pt to sitting.  Pt then with large amount of saliva following by emesis.  RN present to assist with medication.  Discussion with family and husband regarding what may have caused this.  Husband thinking it is high calorie tube feed, therefore nursing to call MD regarding this manner.  Pt wanting to rest remainder of time, therefore assisted back to room and to recliner.  Note HR up to 130's therefore removed PMSV to allow pt to decreased respirations and HR.  SaO2 97%.  Husband requesting RT to suction pt, therefore they  were notified.  Left in recliner with all needs in reach.   Family educated to notify nursing if they leave to don QRB.  Verbalized understanding.    Therapy Documentation Precautions:  Precautions Precautions: Cervical, Fall Precaution Comments: NG tube Required Braces or Orthoses: Cervical Brace, Sling Cervical Brace: Hard collar, At all times Restrictions Weight Bearing Restrictions: Yes LUE Weight Bearing: Non weight bearing   Vital Signs: Therapy Vitals Temp: 98.6 F (37 C) Temp Source: Oral Pulse Rate: 96 Resp: 18 BP: 108/60 mmHg Patient Position (if appropriate): Sitting Oxygen Therapy SpO2: 99 % O2 Device: Tracheostomy Collar O2 Flow Rate (L/min): 5 L/min FiO2 (%): 28 % Pain: Pain Assessment Pain Assessment: Faces Faces Pain Scale: Hurts little more Pain Type: Acute pain Pain Location: Head Pain Intervention(s): Medication (See eMAR)  See FIM for current functional status  Therapy/Group: Individual Therapy  Vista Deck 07/16/2014, 7:45 AM

## 2014-07-17 ENCOUNTER — Inpatient Hospital Stay (HOSPITAL_COMMUNITY): Payer: Self-pay

## 2014-07-17 ENCOUNTER — Inpatient Hospital Stay (HOSPITAL_COMMUNITY): Payer: 59 | Admitting: Speech Pathology

## 2014-07-17 ENCOUNTER — Inpatient Hospital Stay (HOSPITAL_COMMUNITY): Payer: 59

## 2014-07-17 LAB — GLUCOSE, CAPILLARY
GLUCOSE-CAPILLARY: 112 mg/dL — AB (ref 65–99)
GLUCOSE-CAPILLARY: 136 mg/dL — AB (ref 65–99)
Glucose-Capillary: 125 mg/dL — ABNORMAL HIGH (ref 65–99)
Glucose-Capillary: 121 mg/dL — ABNORMAL HIGH (ref 65–99)

## 2014-07-17 LAB — URINE CULTURE: Culture: >=100000

## 2014-07-17 MED ORDER — AMOXICILLIN 250 MG/5ML PO SUSR
250.0000 mg | Freq: Three times a day (TID) | ORAL | Status: DC
  Administered 2014-07-17 – 2014-07-19 (×6): 250 mg
  Filled 2014-07-17 (×9): qty 5

## 2014-07-17 MED ORDER — JEVITY 1.2 CAL PO LIQD
1000.0000 mL | ORAL | Status: DC
  Administered 2014-07-17 – 2014-07-19 (×3): 1000 mL
  Filled 2014-07-17 (×9): qty 1000

## 2014-07-17 MED ORDER — FREE WATER
150.0000 mL | Freq: Three times a day (TID) | Status: DC
  Administered 2014-07-17 – 2014-07-21 (×6): 150 mL

## 2014-07-17 NOTE — Progress Notes (Signed)
Occupational Therapy Note  Patient Details  Name: Tiffany Stevens MRN: [id number] Date of Birth: 1955/06/02  Today's Date: 07/17/2014 OT Individual Time: 1400-1430 OT Individual Time Calculation (min): 30 min   Pt denied pain Individual therapy  [facility], toileting, and grooming tasks.  Pt refused to performed hygiene and husband assisted.  Pt was able to manage clothing.  Pt stood at sink to wash hands.  Pt initially attempted to brush teeth (with suction toothbrush) while standing but became frustrated and refused to continue.  Therapist assisted to assure thoroughness.  Pt abruptly lay back in bed and initially stated that she wasn't going to sit back up because it made her dizzy.  Continued to educate (with husband's assistance) patient on importance of sitting up in chair vs lying in bed.  Pt finally agree to sitting in regular chair and engaged in sit<>stand 3 X 10.  Pt agreed to remain in chair until 3 pm with husband present.  Focus on activity tolerance, sit<>stand, standing balance, cognitive remediation, and safety awareness.  Lavone NeriLanier, Mahogani Holohan [facility] 07/17/2014, 2:52 PM

## 2014-07-17 NOTE — Progress Notes (Addendum)
Tiffany Stevens PHYSICAL MEDICINE & REHABILITATION     PROGRESS NOTE    Subjective/Complaints: Still with a "film" over right eye. Feels right arm/hand might be weaker. Had n/v yesterday---TF reduced ROS limited by cognition , denies pain or breathing difficulty  Objective: Vital Signs: Blood pressure 102/56, pulse 83, temperature 98.7 F (37.1 C), temperature source Oral, resp. rate 18, weight 66.679 kg (147 lb), SpO2 98 %. No results found. No results for input(s): WBC, HGB, HCT, PLT in the last 72 hours. No results for input(s): NA, K, CL, GLUCOSE, BUN, CREATININE, CALCIUM in the last 72 hours.  Invalid input(s): CO CBG (last 3)   Recent Labs  07/16/14 1625 07/16/14 2332 07/17/14 0627  GLUCAP 90 137* 112*    Wt Readings from Last 3 Encounters:  07/17/14 66.679 kg (147 lb)  07/09/14 52.9 kg (116 lb 10 oz)  08/03/07 52.223 kg (115 lb 2.1 oz)    Physical Exam:  Constitutional: She is oriented to person, place, and time. She appears well-developed and well-nourished.  Thin female in no acute distress  HENT:  Left zygomatic arch less swollen without ecchymosis Tongue with multiple fibronecrotic areas which are improving.  Eyes: Pupils are equal, round, and reactive to light. No abnl seen in right eye.  #4 CFS in place. No secretions today. Remains comfortable.   Cardiovascular: Regular rhythm.  .  Tachycardic with HR in 90's  Respiratory: Effort normal. No respiratory distress. She has minimal wheezes. She exhibits tenderness (Right lateral chest wall ).  GI: Soft. Bowel sounds are normal. She exhibits no distension. There is no tenderness.  Musculoskeletal: She exhibits tenderness (left shoulder. ). She exhibits no edema.  Left anterior shoulder tenderness persistent Neurological: She is alert and oriented to person, place, and time. She displays normal reflexes. She exhibits normal muscle tone.      She was able to follow basic motor commands without difficulty. Able  to attend to task without difficulty today.  Attention comes and goes. Usually gestures or writes to communicate Left shoulder 1+/5 due to pain. biceps 3+/5, 2+/5 triceps, grip 3+/5 Right shoulder 3/5, biceps 3+/5, triceps remain weak, grip 2/5.  Bilateral LE: HF 4/5, KE 4/5, ADF/APF 4+.  Skin: Skin is warm and dry.  Diffuse ecchymosis left shoulder and bilateral knees are improving. Persistent abraded area scabbed and has underlying hard hematoma on left cheek. Multiple bruises on forehead.  Psychiatric: Her affect is blunt.   Assessment/Plan: 1. 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 Physiatrist is providing close team supervision and 24 hour management of active medical problems listed below. Physiatrist and rehab team continue to assess barriers to discharge/monitor patient progress toward functional and medical goals.  Long discussion with patient, husband as well as daughter in regards to visual complaints. Appears to be dry eye. Start drops. In regards to right upper extremity weakness could be explained by either traumatic brain injury in the left frontal area or spinal cord injury C6-7 with decreased disc space on the right side compared to the left side FIM: FIM - Bathing Bathing Steps Patient Completed: Front perineal area, Buttocks, Right upper leg, Left upper leg Bathing: 4: Min-Patient completes 8-9 43f 10 parts or 75+ percent  FIM - Upper Body Dressing/Undressing Upper body dressing/undressing steps patient completed:  (pt requested to keep on same shirt; stated she would wash up/change tomorrow ) Upper body dressing/undressing:  0: Activity did not occur FIM - Lower Body Dressing/Undressing Lower body dressing/undressing steps patient completed: Don/Doff right sock, Don/Doff left sock Lower body dressing/undressing: 5: Supervision: Safety  issues/verbal cues  FIM - Toileting Toileting steps completed by patient: Adjust clothing prior to toileting, Performs perineal hygiene, Adjust clothing after toileting Toileting Assistive Devices: Grab bar or rail for support Toileting: 4: Steadying assist  FIM - Diplomatic Services operational officer Devices: Grab bars Toilet Transfers: 4-To toilet/BSC: Min A (steadying Pt. > 75%), 4-From toilet/BSC: Min A (steadying Pt. > 75%)  FIM - Bed/Chair Transfer Bed/Chair Transfer Assistive Devices: Orthosis, HOB elevated, Bed rails Bed/Chair Transfer: 5: Supine > Sit: Supervision (verbal cues/safety issues)  FIM - Locomotion: Wheelchair Locomotion: Wheelchair: 1: Total Assistance/staff pushes wheelchair (Pt<25%) FIM - Locomotion: Ambulation Locomotion: Ambulation Assistive Devices: Other (comment) (R HHA) Ambulation/Gait Assistance: 4: Min assist Locomotion: Ambulation: 1: Travels less than 50 ft with minimal assistance (Pt.>75%)  Comprehension Comprehension Mode: Auditory Comprehension: 5-Follows basic conversation/direction: With extra time/assistive device  Expression Expression Mode: Verbal Expression Assistive Devices: 6-Communication board Expression: 2-Expresses basic 25 - 49% of the time/requires cueing 50 - 75% of the time. Uses single words/gestures.  Social Interaction Social Interaction: 4-Interacts appropriately 75 - 89% of the time - Needs redirection for appropriate language or to initiate interaction.  Problem Solving Problem Solving: 4-Solves basic 75 - 89% of the time/requires cueing 10 - 24% of the time  Memory Memory: 4-Recognizes or recalls 75 - 89% of the time/requires cueing 10 - 24% of the time  Medical Problem List and Plan: 1. 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  Weakness on right side as well  -right arm weakness could be related to C7 nerve root compression from initial injury----and patient just more aware as she improves cognitively 2. DVT Prophylaxis/Anticoagulation: Mechanical: Sequential compression devices, below knee Bilateral lower extremities---dopplers negative 3. Headaches/ Pain Management: Continue Duragesic patch with hydrocodone prn.  -consider topamax for headaches if persistent---no issues at present 4. Mood: LCSW to follow for support and evaluation as cognition/ability to communicate improves. 5. Neuropsych: This patient is not fully capable of making decisions on her own behalf. 6. Skin/Wound Care: Routine pressure relief measures.  7. Fluids/Electrolytes/Nutrition: Monitor I/O. Continue tube feeds with increased water flushes.     -reduced rate due to n/v yesterday 8. VDRF:  -PMV trials  -did well with downsize to #4---coughing secretions up through mouth----plug today  -NGT remains in place for dysphagia   -hold on MBS until trach is out     -can continue bedside trials for now 9. C2 nondisplaced fracture including type 3 odontoid fracture: Continue Cervical collar for support.  10. ABLA: Resolving. hgb 10.4---no signs of blood loss---recheck early next week 11. Leucocytosis: Monitor for signs of infection. cbc pending  12. Hyperglycemia: Likely due to stress as well as tube feeds. Will check Hgb A1C and add SSI to help manage BS.  13. Enterobacter PNA: ceftriaxone completed 14. Urinary retention:     -ucx with 100k enterococcus sens to amoxil--has been on abx since thursday   15. Distal left clavicle fx---place in shoulder sling, pain control, no pain to palpation over this area  -may participate in basic self care tasks with LUE, avoid use of left arm for transfers at this point 16. Fibronecrotic tongue ulcers:  Continue aggressive oral care with hydrogen perioxide tid as well as Peridex  rinses.  -improving    LOS (Days) 7 A FACE TO FACE EVALUATION WAS PERFORMED  Edris Friedt T 07/17/2014 8:08 AM

## 2014-07-17 NOTE — Progress Notes (Signed)
Resting comfortably. Agreeable for trach care. Difficult to assess trach site R/T C-collar. Continues to write on dry erase board to communicate vs using PMV or covering trach. No further complaints of nausea. Alfredo Martinez A

## 2014-07-17 NOTE — Progress Notes (Signed)
Restless night until 0300. Reluctant about taking scheduled meds at HS, because of N& V on previous shift. PRN xanax and trazodone 50mg  given at 2114. Refused scheduled miralax and mylicon. Offered eye drops, but declined. Without further c/o N & V. PRN robaxin given at 0218. [facility]. Assistance. Refused trach care, will offer again in AM. Congested cough at times. Coughing up secretions, mostly from mouth. Trach collar in place. Mouth care provided, except when asleep. Alfredo MartinezMurray, Evoleht Hovatter A

## 2014-07-17 NOTE — Progress Notes (Signed)
Occupational Therapy Weekly Progress Note  Patient Details  Name: Tiffany Stevens MRN: [id number] Date of Birth: 12/02/1955  Beginning of progress report period: July 11, 2014 End of progress report period: July 17, 2014  Today's Date: 07/17/2014 OT Individual Time: 0700-0800 and 1130-1200 OT Individual Time Calculation (min): 60 min and 30 min    Patient has met 4 of 4 short term goals.  Patient has made good progress during this reporting period. Patient currently requires min A-close supervision for transfers. Pt progressed with 9-hole peg test score, decreasing her time from 75s to 60s in one week. Pt demonstrates improved midline orientation, balance, postural control, ability to communicate needs during therapy. Pt requires increased assistance during ADLs likely due to decreased volition and distractibility from family members.   Patient continues to demonstrate the following deficits: decreased activity tolerance, decreased standing balance, decreased safety awareness, impulsivity, decreased RUE strength and coordination, decreased core strength, and therefore will continue to benefit from skilled OT intervention to enhance overall performance with BADL, reduce caregiver burden, and improve dynamic standing balance, functional ambulation, postural control, strength, endurance, and safety awareness.   Patient progressing toward long term goals..  Continue plan of care.  OT Short Term Goals Week 1:  OT Short Term Goal 1 (Week 1): Pt will complete UB dressing with min A to increase functional independence. OT Short Term Goal 1 - Progress (Week 1): Met OT Short Term Goal 2 (Week 1): Pt will complete LB dressing with min A to increase functional independence.  OT Short Term Goal 2 - Progress (Week 1): Met OT Short Term Goal 3 (Week 1): Pt will complete sit<>stand transfers at close supervision level to increase functional independence.  OT Short Term Goal 3 - Progress (Week 1): Met OT Short  Term Goal 4 (Week 1): Pt will engage in functional activity in standing for 3 minutes to increase functional activity tolerance.  OT Short Term Goal 4 - Progress (Week 1): Met Week 2:  OT Short Term Goal 1 (Week 2): STG=LTG due to ELOS  Skilled Therapeutic Interventions/Progress Updates:    Session 1: Pt seen for 1:1 OT session with a focus on ADL retraining, standing balance, functional mobility, task initiation, activity tolerance, and safety awareness. Pt received supine in bed with MD and family present. Pt completed bed mobility at supervision level with mod cues for technique and decreased LUE usage. Pt completed EOB>w/c with HHA. Pt participated in ADL routine in w/c at sink level via sit<>stand. Pt required mod-max encouragement cues for participation. Noted perseveration on clothing options and hair washing with mod difficulty shifting attention. Pt completed ADL routine with increased time and continued use of written expression despite PMSV. Noted decreased volition and increased frustration level with family present. Pt left seated in w/c with family and all needs within reach.   Session 2: Pt seen for 1:1 OT session with a focus on activity tolerance, safety awareness, and R hand strengthening and coordination. Pt received supine in bed following CT scan. Pt engaged in seated tasks focusing on RUE strength and coordination. Pt engaged in 9-hole peg test with improved score of 60s. Pt engaged in large peg board activity to refine grasp and encourage modified pincer grasp. Pt demonstrated mod difficulty with in-hand manipulation of small pegs, and required mod cues for technique to strengthen intrinsic muscles. Pt continues to demonstrate decreased intrinsic strength impacting grasp. Pt completed stacking, in-hand manipulation, and refined grasp with small tokens. Noted increased engagement in session  without family present. Pt left seated in w/c with OT present.   Therapy  Documentation Precautions:  Precautions Precautions: Cervical, Fall Precaution Comments: NG tube Required Braces or Orthoses: Cervical Brace, Sling Cervical Brace: Hard collar, At all times Restrictions Weight Bearing Restrictions: Yes LUE Weight Bearing: Non weight bearing General:   Vital Signs: Therapy Vitals Pulse Rate: 88 Resp: 20 BP: 106/64 mmHg Patient Position (if appropriate): Sitting Oxygen Therapy SpO2: 98 % O2 Device: Not Delivered O2 Flow Rate (L/min): 5 L/min FiO2 (%): 28 % Pain: Pain Assessment Faces Pain Scale: Hurts a little bit Pain Type: Acute pain Pain Location: Shoulder Pain Orientation: Right;Left Pain Onset: With Activity Pain Intervention(s): Rest ADL:   Exercises:   Other Treatments:    See FIM for current functional status  Therapy/Group: Individual Therapy  Dorann Ou 07/17/2014, 11:25 AM

## 2014-07-17 NOTE — Progress Notes (Signed)
Physical Therapy Session Note  Patient Details  Name: Tiffany Stevens MRN: [id number] Date of Birth: [birth year]  Today's Date: 07/17/2014 PT Individual Time: 4888-9169 PT Individual Time Calculation (min): 54 min   Short Term Goals: Week 1:  PT Short Term Goal 1 (Week 1): = LTGs of overall supervision  Skilled Therapeutic Interventions/Progress Updates:    Patient in wheelchair assisted to gym in chair with spouse present most of session.  Patient with plugged trach and able to vocalize at times.  Activities for strength and hip stability including sit to stand x 5 no UE assist, side stepping over yoga blocks, then side stepping without obstacles, forward march and backward walking, side steps up 6" step with right hand on rail 5 x each way.  Gait with HHA on right versus no device up to 170' min assist occasional veering to left or cross stepping.  Tandem gait x 30' x 2 min A.  Patient in parallel bars marching on foam, static feet apart eyes closed 10 seconds, feet together eyes to left/right x 30 seconds, then up/down for vestibular use.  Patient min c/o wooziness after activities on foam.  Patient sit to supine after ambulating to room with supervision, cues for positioning.  Left in room call bell in reach and spouse in the room.  Therapy Documentation Precautions:  Precautions Precautions: Cervical, Fall Precaution Comments: NG tube Required Braces or Orthoses: Cervical Brace, Sling Cervical Brace: Hard collar, At all times Restrictions Weight Bearing Restrictions: Yes LUE Weight Bearing: Non weight bearing Vital Signs: Therapy Vitals Pulse Rate: 87 Resp: 18 BP: 106/64 mmHg Patient Position (if appropriate): Sitting Oxygen Therapy SpO2: 100 % O2 Device: Not Delivered O2 Flow Rate (L/min): 5 L/min FiO2 (%): 28 % Pain: Pain Assessment Faces Pain Scale: Hurts a little bit Pain Type: Acute pain Pain Location: Shoulder Pain Orientation: Right;Left Pain Onset: With  Activity Pain Intervention(s): Rest   See FIM for current functional status  Therapy/Group: Individual Therapy  Rudi Coco [place], [place] [phone] 07/17/2014  07/17/2014, 9:55 AM

## 2014-07-17 NOTE — Progress Notes (Signed)
Occupational Therapy Session Note  Patient Details  Name: Tiffany Stevens MRN: [id number] Date of Birth: December 12, 1955  Today's Date: 07/17/2014 OT Individual Time: 1200-1220 OT Individual Time Calculation (min): 20 min  and Today's Date: 07/17/2014 OT Missed Time: 10 Minutes Missed Time Reason: CT/MRI   Short Term Goals: Week 1:  OT Short Term Goal 1 (Week 1): Pt will complete UB dressing with min A to increase functional independence. OT Short Term Goal 2 (Week 1): Pt will complete LB dressing with min A to increase functional independence.  OT Short Term Goal 3 (Week 1): Pt will complete sit<>stand transfers at close supervision level to increase functional independence.  OT Short Term Goal 4 (Week 1): Pt will engage in functional activity in standing for 3 minutes to increase functional activity tolerance.   Skilled Therapeutic Interventions/Progress Updates:    Pt seen for 1:1 OT session with focus on dynamic standing balance, R hand strengthening and coordination, functional mobility, and activity tolerance. Pt transitioning from OT session with OT student. Pt engaged in clothes pen activity in standing with steadying-SBA for balance. Pt able to place 3 clothes pens using gross grasp of easiest resistance. Pt frustrated with activity and requesting to return to room. Terminated task and pt willing to participate in other therapeutic activity with encouragement. Transitioned to ADL apartment and completed bed making task with min A overall for balance and pt using RUE managing covers and carrying pillows. Pt required 2 rest breaks during activity d/t RUE fatigue and dizziness. Pt noted to have increased dizziness during turn changes, reporting 4/10 dizziness. Will continue to integrate vestibular system into functional activities. Pt requesting to return to room therefore ambulated apprx 50' with min HHA before requesting w/c. Pt left supine in bed with HOB elevated and all needs in reach.  Therapy  Documentation Precautions:  Precautions Precautions: Cervical, Fall Precaution Comments: NG tube Required Braces or Orthoses: Cervical Brace, Sling Cervical Brace: Hard collar, At all times Restrictions Weight Bearing Restrictions: Yes LUE Weight Bearing: Non weight bearing General: General OT Amount of Missed Time: 10 Minutes Vital Signs: Therapy Vitals Temp: 98.4 F (36.9 C) Temp Source: Oral Pulse Rate: 88 Resp: 20 BP: 110/72 mmHg Patient Position (if appropriate): Lying Oxygen Therapy SpO2: 98 % O2 Device: Not Delivered Pain: No report of pain   See FIM for current functional status  Therapy/Group: Individual Therapy  Daneil Danerkinson, Aseret Hoffman N 07/17/2014, 12:26 PM

## 2014-07-17 NOTE — Progress Notes (Signed)
Trach plugged at this time per MD order. She is doing very well. SAT 98% on RA. RT to monitor as needed

## 2014-07-17 NOTE — Progress Notes (Signed)
Speech Language Pathology Daily Session Note  Patient Details  Name: Tiffany Stevens MRN: [id number] Date of Birth: September 15, 1955  Today's Date: 07/17/2014 SLP Individual Time: 1005-1055 SLP Individual Time Calculation (min): 50 min  Short Term Goals: Week 1: SLP Short Term Goal 1 (Week 1): Patient will consume ice chips with no more than 3 swallows per trial to demonstrate readiness for objective assessment.  SLP Short Term Goal 2 (Week 1): Patient will demonstrate toleration of PMSV with [facility]als [facility] with Min cues for expectoration of secreations.      SLP Short Term Goal 3 (Week 1): Patient will produce phrase-sentence level verbal expression with Min cues for increased vocal intensity.  SLP Short Term Goal 4 (Week 1): Patient with demonstrate basic safety awareness and problem solving by requesting help as needed with Min question cues during self-care tasks.   Skilled Therapeutic Interventions:   Upon SLP arrival patient requested 5 minutes to rest; as a result, patient missed first 5 minutes of session. Patient required Max encouragement to participate in session targeting speech and dysphagia goals. Patient with trach plugged today; however, still using gestures and writing.  SLP educated patient on importance of verbal communication for recovery; patient required Max multimodal cues for increased vocal intensity throughout session.  SLP also facilitated session with requests to return demonstration of previously taught and reinforced pharyngeal strengthening exercises which patient required Max assist multimodal cues to do.  Patient consumed pureed trials with 4-5 swallow per half teaspoon with intermittent s/s of aspiration.  Recommended patient and husband complete exercises again later today. Patient appeared more fatigued during today's session which could have impacted her performance today. Of note, patient missed an additional 5 minutes at the end of the session due to  CT.     FIM:  Comprehension Comprehension Mode: Auditory Comprehension: 5-Follows basic conversation/direction: With extra time/assistive device Expression Expression Mode: Verbal Expression Assistive Devices: 6-Talk trach valve Expression: 2-Expresses basic 25 - 49% of the time/requires cueing 50 - 75% of the time. Uses single words/gestures. Social Interaction Social Interaction: 4-Interacts appropriately 75 - 89% of the time - Needs redirection for appropriate language or to initiate interaction. Problem Solving Problem Solving: 4-Solves basic 75 - 89% of the time/requires cueing 10 - 24% of the time Memory Memory: 3-Recognizes or recalls 50 - 74% of the time/requires cueing 25 - 49% of the time  Pain Pain Assessment Faces Pain Scale: Hurts a little bit Pain Type: Acute pain Pain Location: Shoulder Pain Orientation: Right;Left Pain Onset: With Activity Pain Intervention(s): Rest  Therapy/Group: Individual Therapy  Tiffany Stevens, M.A., CCC-SLP [phone]  Tiffany Stevens 07/17/2014, 11:56 AM
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
## 2014-07-18 ENCOUNTER — Inpatient Hospital Stay (HOSPITAL_COMMUNITY): Payer: 59

## 2014-07-18 ENCOUNTER — Inpatient Hospital Stay (HOSPITAL_COMMUNITY): Payer: 59 | Admitting: Speech Pathology

## 2014-07-18 ENCOUNTER — Inpatient Hospital Stay (HOSPITAL_COMMUNITY): Payer: 59 | Admitting: Physical Therapy

## 2014-07-18 LAB — GLUCOSE, CAPILLARY
GLUCOSE-CAPILLARY: 127 mg/dL — AB (ref 65–99)
GLUCOSE-CAPILLARY: 144 mg/dL — AB (ref 65–99)
GLUCOSE-CAPILLARY: 172 mg/dL — AB (ref 65–99)
Glucose-Capillary: 129 mg/dL — ABNORMAL HIGH (ref 65–99)
Glucose-Capillary: 165 mg/dL — ABNORMAL HIGH (ref 65–99)

## 2014-07-18 NOTE — Progress Notes (Signed)
Pt was not available for St[facility] check. Tiffany Stevens was removed today. Pt was visiting with family outside room. RT will check next round.
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
## 2014-07-18 NOTE — Progress Notes (Signed)
Physical Therapy Weekly Progress Note  Patient Details  Name: Tiffany Stevens MRN: [id number] Date of Birth: 07/29/1955  Beginning of progress report period: July 11, 2014 End of progress report period: July 18, 2014  Today's Date: 07/18/2014 PT Individual Time: 1030-1130 and 1300-1345 PT Individual Time Calculation (min): 60 min and 45 min   Patient has met 2 of 9 long term goals. Patient is making steady progress and currently requires supervision-min A for mobility without device dependent on patient motivation to complete tasks or maintain balance independently. Patient demonstrates dependent behaviors when family present including having family don clothing/socks and attempting to pull or hold on to family members for mobility that patient is capable of performing with supervision. Patient continues to maintain neck laterally flexed to R requiring max multimodal cues to correct to midline orientation and resulting in frequent need to readjust cervical collar due to poor fit with head positioning. Family training initiated with patient's daughter Tiffany Stevens, continue to reinforce hands-on training/education with patient's spouse.   Patient continues to demonstrate the following deficits: weakness, decreased endurance, decreased standing balance, decreased balance strategies, decreased coordination, decreased safety awareness, decreased recall of precautions and therefore will continue to benefit from skilled PT intervention to enhance overall performance with activity tolerance, balance, postural control, ability to compensate for deficits, functional use of  right upper extremity, right lower extremity and left lower extremity, attention, awareness, coordination and knowledge of precautions.  See Patient's Care Plan for progression toward long term goals.  Patient progressing toward long term goals.  Continue plan of care.  Skilled Therapeutic Interventions/Progress Updates:   Session 1: Focus on bed  mobility, coordination, strength, activity tolerance, and patient/family education. Patient encouraged to get out of bed on R side for improved independence and ability to push with RUE to elevate trunk due to limited WB status for LUE, min guard > supervision. PA notified to assess fit of collar and adjusted with improved stabilization noted. Performed passive ROM cervical lateral flexion to L to midline multiple times throughout session with max multimodal cues for maintaining head/neck at midline. RN notified of patient c/o headache pain and present to administer medication. Patient ambulated to/from bathroom and able to perform tasks with supervision except requested that daughter perform hygiene. Patient encouraged to complete as much as possible independently before requesting assistance to increase independence. Patient ambulated from room > chairs by elevator > ADL apartment with supervision without device, patient frequently reaching to hold hands of daughter or granddaughter despite cues to attempt to ambulate without HHA. In ADL apartment, patient instructed in bed mobility on regular bed on R side to promote use of RUE for transition from sidelying <> sit, 3 x with supervision and min verbal cues for technique. Patient performed furniture transfers in ADL apartment without UE support and supervision. Patient c/o dizziness with all transitional movements throughout session, scheduling notified of need for vestibular evaluation. Stair training up/down twelve 6" stairs using RUE support on rail with supervision and self elected step to pattern. For coordination and dynamic balance, performed side stepping to R and L x 30-45 ft each direction and retro gait x 30 ft with R HHA. Performed squats x 10, heel raises x 10, marching x 20. Patient left semi reclined in bed with needs within reach and family present. Vitals monitored throughout session and WFL. Patient continues to demonstrate diminished vocal  intensity with use of gestures for primary means of communication.   Session 2: Patient's daughter Tiffany Stevens observed assisting  patient to bathroom with HHA and safety plan updated to clear her to assist patient to bathroom. Patient ambulated throughout rehab unit without device with supervision. Patient demonstrates increased fall risk as noted by score of   41/56 on Berg Balance Scale (<36= high risk for falls, close to 100%; 37-45 significant >80%; 46-51 moderate >50%; 52-55 lower >25%). Patient instructed in 3 trials of TUG cognitive with avg score = 29 sec with supervision. Patient participated in 2 rounds of horseshoes while standing on compliant surface with normal BOS and no LOB noted with multidirectional reaching outside BOS to grasp object using RUE before tossing. Patient utilized RUE to place/remove pegs on board in standing with increased time to manipulate object in hand due to RUE weakness, but able to complete task successfully. Patient left sitting in chair with daughter and son in law present to await following therapy session.   Therapy Documentation Precautions:  Precautions Precautions: Cervical, Fall Precaution Comments: NG tube Required Braces or Orthoses: Cervical Brace, Sling Cervical Brace: Hard collar, At all times Restrictions Weight Bearing Restrictions: Yes LUE Weight Bearing: Non weight bearing Pain: Pain Assessment Pain Assessment: 0-10 Pain Score: 5  Pain Type: Acute pain Pain Location: Head Pain Orientation: Right;Left Pain Descriptors / Indicators: Aching Pain Frequency: Intermittent Pain Onset: On-going Patients Stated Pain Goal: 3 Pain Intervention(s): Medication (See eMAR)  Locomotion : Ambulation Ambulation/Gait Assistance: 5: Supervision;4: Min guard  Balance: Balance Balance Assessed: Yes Standardized Balance Assessment Standardized Balance Assessment: Berg Balance Test;Timed Up and Go Test Berg Balance Test Sit to Stand: Able to stand without  using hands and stabilize independently Standing Unsupported: Able to stand 2 minutes with supervision Sitting with Back Unsupported but Feet Supported on Floor or Stool: Able to sit safely and securely 2 minutes Stand to Sit: Sits safely with minimal use of hands Transfers: Able to transfer with verbal cueing and /or supervision Standing Unsupported with Eyes Closed: Able to stand 10 seconds with supervision Standing Ubsupported with Feet Together: Able to place feet together independently and stand for 1 minute with supervision From Standing, Reach Forward with Outstretched Arm: Reaches forward but needs supervision From Standing Position, Pick up Object from Floor: Able to pick up shoe, needs supervision From Standing Position, Turn to Look Behind Over each Shoulder: Looks behind one side only/other side shows less weight shift Turn 360 Degrees: Able to turn 360 degrees safely but slowly Standing Unsupported, Alternately Place Feet on Step/Stool: Able to stand independently and complete 8 steps >20 seconds Standing Unsupported, One Foot in Front: Able to plae foot ahead of the other independently and hold 30 seconds Standing on One Leg: Able to lift leg independently and hold 5-10 seconds Total Score: 41 Timed Up and Go Test TUG: Cognitive TUG Cognitive TUG (seconds): 29 (avg of 3 trials with S)  See FIM for current functional status  Therapy/Group: Individual Therapy  Laretta Alstrom 07/18/2014, 11:57 AM

## 2014-07-18 NOTE — Progress Notes (Signed)
Speech Language Pathology Weekly Progress and Session Note  Patient Details  Name: Tiffany Stevens MRN: [id number] Date of Birth: 22-Oct-1955  Beginning of progress report period: July 11, 2014 End of progress report period: July 18, 2014  Today's Date: 07/18/2014 SLP Individual Time: 0830-0930 SLP Individual Time Calculation (min): 60 min  Short Term Goals: Week 1: SLP Short Term Goal 1 (Week 1): Patient will consume ice chips with no more than 3 swallows per trial to demonstrate readiness for objective assessment.  SLP Short Term Goal 1 - Progress (Week 1): Revised due to lack of progress SLP Short Term Goal 2 (Week 1): Patient will demonstrate toleration of PMSV with vitals remaining [facility] for 15 minutes with Min cues for expectoration of secreations.      SLP Short Term Goal 2 - Progress (Week 1): Met SLP Short Term Goal 3 (Week 1): Patient will produce phrase-sentence level verbal expression with Min cues for increased vocal intensity.  SLP Short Term Goal 3 - Progress (Week 1): Progressing toward goal SLP Short Term Goal 4 (Week 1): Patient with demonstrate basic safety awareness and problem solving by requesting help as needed with Min question cues during self-care tasks.  SLP Short Term Goal 4 - Progress (Week 1): Met    New Short Term Goals: Week 2: SLP Short Term Goal 1 (Week 2): Patient will consume honey-thick liquids via teaspoon with no more than 3 swallows per trial to demonstrate readiness for objective assessment.  SLP Short Term Goal 2 (Week 2): Patient will produce phrase level verbal expression with Mod cues for increased vocal intensity.  SLP Short Term Goal 3 (Week 2): Patient with demonstrate basic safety awareness and problem solving by requesting help as needed with Supervision level question cues during self-care tasks.  SLP Short Term Goal 4 (Week 2): Patient will recall and perform pharyngeal strenghtening exercises with Min verbal cues.   Weekly Progress  Updates: Patient has made functional gains and has met 2 of 4 short term goals this reporting period due to improved toleration of PMSV which has progressed to decannulation and improved cognitive skills. Currently, patient continues to require Supervision assist for basic self-care tasks and Max cues for increased vocal intensity.  Patient also remains NPO with NG, awaiting an objective assessment tomorrow.  Patient and family education is ongoing at this time. Patient would benefit from continued skilled SLP intervention to maximize functional independence and in hopes of initiation of a least restrictive [place] diet prior to discharge home with 24 hour supervision.   Intensity: Minumum of 1-2 x/day, 30 to 90 minutes Frequency: 3 to 5 out of 7 days Duration/Length of Stay: 5-7 more days  Treatment/Interventions: Cognitive remediation/compensation;Cueing hierarchy;Environmental controls;Internal/external aids;Functional tasks;Patient/family education;Dysphagia/aspiration precaution training;Speech/Language facilitation  Daily Session  Skilled Therapeutic Interventions:    Skilled treatment session focused on addressing cognitive, speech and swallow goals. SLP facilitated session by providing Min assist faded to Supervision assist during a toileting task; patient required encouragement to attempt self-care tasks on her own which resulted in increased independence.  Patient was decannulated today and continued to require Max faded to Mod cues for increased breath support with word level expression in structured tasks which resulted in improved vocal intensity and overall intelligibility.  [place] trials of honey-thick water via teaspoon were administered and resulted in 4-5 swallows per sip.  Patient benefited from Mod verbal cues for hard effort swallows resulting in reduction to 3 swallows per trial.  Plan for an objective assessment of  swallow tomorrow.     FIM:  Comprehension Comprehension Mode:  Auditory Comprehension: 5-Follows basic conversation/direction: With extra time/assistive device Expression Expression Mode: Verbal Expression: 3-Expresses basic 50 - 74% of the time/requires cueing 25 - 50% of the time. Needs to repeat parts of sentences. Social Interaction Social Interaction: 4-Interacts appropriately 75 - 89% of the time - Needs redirection for appropriate language or to initiate interaction. Problem Solving Problem Solving: 5-Solves basic 90% of the time/requires cueing < 10% of the time Memory Memory: 4-Recognizes or recalls 75 - 89% of the time/requires cueing 10 - 24% of the time FIM - Eating Eating Activity: 1: Helper performs IV, parenteral, or tube feeding General    Pain Pain Assessment Pain Assessment: 0-10 Pain Score: 4  Pain Type: Acute pain Pain Location: Head Pain Orientation: Right;Left Pain Descriptors / Indicators: Aching;Headache Pain Frequency: Intermittent Pain Onset: On-going Patients Stated Pain Goal: 3 Pain Intervention(s): Refused (pt refusing tyelenol at this time. Pt report drowsiness )  Therapy/Group: Individual Therapy  Carmelia Roller., CCC-SLP [phone]  [place] 07/18/2014, 10:34 AM

## 2014-07-18 NOTE — Progress Notes (Signed)
Occupational Therapy Session Note  Patient Details  Name: Tiffany Stevens MRN: [id number] Date of Birth: 02-03-1955  Today's Date: 07/18/2014 OT Individual Time: 0700-0800 and 1400-1445 OT Individual Time Calculation (min): 60 min and 45 min    Short Term Goals: Week 1:  OT Short Term Goal 1 (Week 1): Pt will complete UB dressing with min A to increase functional independence. OT Short Term Goal 1 - Progress (Week 1): Met OT Short Term Goal 2 (Week 1): Pt will complete LB dressing with min A to increase functional independence.  OT Short Term Goal 2 - Progress (Week 1): Met OT Short Term Goal 3 (Week 1): Pt will complete sit<>stand transfers at close supervision level to increase functional independence.  OT Short Term Goal 3 - Progress (Week 1): Met OT Short Term Goal 4 (Week 1): Pt will engage in functional activity in standing for 3 minutes to increase functional activity tolerance.  OT Short Term Goal 4 - Progress (Week 1): Met  Skilled Therapeutic Interventions/Progress Updates:    Session 1: Pt seen for 1:1 OT session with a focus on ADL retraining, functional mobility, dynamic standing balance, RUE strengthening and coordination, activity tolerance, and safety awareness. Pt received supine in bed with family present and agreeable to participate in session. Pt completed bed mobility at min A level with mod cues for technique. Pt completed transfer to w/c with HHA from therapist. Pt then engaged in B/D at sink level via sit<>stand with min A. Noted increased engagement and participation in ADL session. Pt able to verbalize clothing item choice and engage in conversation during ADL. Pt able to complete routine with decreased time likely due to increased motivation to complete task. Pt then completed functional ambulation via HHA with steadying A from therapist apprx 29' to therapy gym. Pt engaged in R hand strengthening and coordination tasks in sitting and standing. Pt completed intrinsic  muscle strengthening exercises with foam block and rubber band 3 sets of 10 each. Pt demonstrated mod difficulty with activity due to decreased grasp strength, to be continued during therapy. Pt then engaged in activity with three marbles to facilitate in-hand manipulation skills and pincer grasp. Pt engaged in clothespin activity in standing with 6 clips (yellow and red clips). Pt needed min A with pincer grasp strength on red clip. Pt then completed dynamic balance activity in standing with steadying A. Pt voiced mild-mod feelings of dizziness with head positional changes likely due to vestibular system. Pt left seated in w/c with all needs in reach.   Session 2: Pt seen for 1:1 OT session with a focus on functional mobility, dynamic balance, FM coordination and strengthening, activity tolerance, and safety awareness. Pt received seated in chair agreeable to therapy. Pt ambulated apprx 150' with HHA and steadying A from therapist. Pt completed dynamic balance activity of soccer ball kick with OT with steadying A and no LOB noted. Pt completed task for apprx 12 minutes with two rest breaks. Pt progressed from stationary kicking to walking and kicking the ball with steadying A. Pt then engaged in seated therapeutic activities with a focus on: FM coordination, in-hand manipulation, intrinsic hand muscle strengthening, and pincer grasp. Pt then completed kitchen task opening 5 jars of varying difficulty 2x. Pt able to independently open 4/5 jars and required min A for most difficult jar. Pt requesting to return to bed following session and completed bed mobility at supervision. Pt left supine in bed with family present and all other needs within reach.  Therapy Documentation Precautions:  Precautions Precautions: Cervical, Fall Precaution Comments: NG tube Required Braces or Orthoses: Cervical Brace, Sling Cervical Brace: Hard collar, At all times Restrictions Weight Bearing Restrictions: Yes LUE Weight  Bearing: Non weight bearing General:   Vital Signs: Therapy Vitals Pulse Rate: 85 Resp: 20 Oxygen Therapy SpO2: 97 % O2 Device: Not Delivered Pain: Pain Assessment Pain Assessment: 0-10 Pain Score: 5  Pain Type: Acute pain Pain Location: Head Pain Orientation: Right;Left Pain Descriptors / Indicators: Aching Pain Frequency: Intermittent Pain Onset: On-going Patients Stated Pain Goal: 3 Pain Intervention(s): Medication (See eMAR) ADL:   Exercises:   Other Treatments:    See FIM for current functional status  Therapy/Group: Individual Therapy  Dorann Ou 07/18/2014, 12:18 PM

## 2014-07-18 NOTE — Progress Notes (Signed)
Patient trach taken out per MD order and RN at beside. Patient tolerated well and sat 98%. Pressure dressing applied. RT to monitor as needed.

## 2014-07-19 ENCOUNTER — Inpatient Hospital Stay (HOSPITAL_COMMUNITY): Payer: 59 | Admitting: Physical Therapy

## 2014-07-19 ENCOUNTER — Inpatient Hospital Stay (HOSPITAL_COMMUNITY): Payer: 59

## 2014-07-19 ENCOUNTER — Encounter (HOSPITAL_COMMUNITY): Payer: 59

## 2014-07-19 DIAGNOSIS — R1310 Dysphagia, unspecified: Secondary | ICD-10-CM | POA: Insufficient documentation

## 2014-07-19 DIAGNOSIS — R1314 Dysphagia, pharyngoesophageal phase: Secondary | ICD-10-CM | POA: Diagnosis present

## 2014-07-19 LAB — CBC
HCT: 34 % — ABNORMAL LOW (ref 36.0–46.0)
Hemoglobin: 11.2 g/dL — ABNORMAL LOW (ref 12.0–15.0)
MCH: 32.1 pg (ref 26.0–34.0)
MCHC: 32.9 g/dL (ref 30.0–36.0)
MCV: 97.4 fL (ref 78.0–100.0)
PLATELETS: 323 10*3/uL (ref 150–400)
RBC: 3.49 MIL/uL — ABNORMAL LOW (ref 3.87–5.11)
RDW: 17.2 % — AB (ref 11.5–15.5)
WBC: 6.7 10*3/uL (ref 4.0–10.5)

## 2014-07-19 LAB — GLUCOSE, CAPILLARY
GLUCOSE-CAPILLARY: 130 mg/dL — AB (ref 65–99)
GLUCOSE-CAPILLARY: 96 mg/dL (ref 65–99)
Glucose-Capillary: 151 mg/dL — ABNORMAL HIGH (ref 65–99)
Glucose-Capillary: 90 mg/dL (ref 65–99)
Glucose-Capillary: 97 mg/dL (ref 65–99)

## 2014-07-19 LAB — PROTIME-INR
INR: 1.02 (ref 0.00–1.49)
PROTHROMBIN TIME: 13.6 s (ref 11.6–15.2)

## 2014-07-19 LAB — APTT: aPTT: 28 seconds (ref 24–37)

## 2014-07-19 MED ORDER — SODIUM CHLORIDE 0.9 % IV SOLN
INTRAVENOUS | Status: DC
  Administered 2014-07-19 – 2014-07-21 (×4): via INTRAVENOUS

## 2014-07-19 MED ORDER — POLYETHYLENE GLYCOL 3350 17 G PO PACK
17.0000 g | PACK | Freq: Two times a day (BID) | ORAL | Status: DC
  Administered 2014-07-22: 17 g
  Filled 2014-07-19 (×11): qty 1

## 2014-07-19 MED ORDER — METHOCARBAMOL 500 MG PO TABS: 500.0000 mg | ORAL_TABLET | Freq: Four times a day (QID) | ORAL | Status: DC | PRN

## 2014-07-19 MED ORDER — SODIUM CHLORIDE 0.9 % IV SOLN
1.0000 g | Freq: Four times a day (QID) | INTRAVENOUS | Status: DC
  Administered 2014-07-19 – 2014-07-21 (×8): 1 g via INTRAVENOUS
  Filled 2014-07-19 (×10): qty 1000

## 2014-07-19 MED ORDER — MORPHINE SULFATE 2 MG/ML IJ SOLN
2.0000 mg | INTRAMUSCULAR | Status: DC | PRN
  Administered 2014-07-19 – 2014-07-21 (×6): 2 mg via INTRAVENOUS
  Filled 2014-07-19 (×6): qty 1

## 2014-07-19 MED ORDER — LORAZEPAM 0.5 MG PO TABS: 0.5000 mg | ORAL_TABLET | Freq: Four times a day (QID) | ORAL | Status: AC | PRN

## 2014-07-19 MED ORDER — LORATADINE 10 MG PO TABS
10.0000 mg | ORAL_TABLET | Freq: Every day | ORAL | Status: DC | PRN
  Filled 2014-07-19: qty 1

## 2014-07-19 MED ORDER — SIMETHICONE 40 MG/0.6ML PO SUSP
80.0000 mg | Freq: Four times a day (QID) | ORAL | Status: DC
  Administered 2014-07-21 – 2014-07-24 (×14): 80 mg
  Filled 2014-07-19 (×21): qty 1.2

## 2014-07-19 MED ORDER — ALPRAZOLAM 0.25 MG PO TABS
0.2500 mg | ORAL_TABLET | Freq: Three times a day (TID) | ORAL | Status: DC | PRN
  Administered 2014-07-22 – 2014-07-23 (×3): 0.25 mg
  Filled 2014-07-19 (×3): qty 1

## 2014-07-19 MED ORDER — LORAZEPAM 2 MG/ML IJ SOLN
0.5000 mg | Freq: Four times a day (QID) | INTRAMUSCULAR | Status: AC | PRN
  Administered 2014-07-19: 0.5 mg via INTRAMUSCULAR
  Filled 2014-07-19: qty 1

## 2014-07-19 MED ORDER — GUAIFENESIN 100 MG/5ML PO SOLN
5.0000 mL | Freq: Three times a day (TID) | ORAL | Status: DC
  Administered 2014-07-21 – 2014-07-24 (×13): 100 mg
  Filled 2014-07-19 (×21): qty 5
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
## 2014-07-19 NOTE — Progress Notes (Signed)
Social Work Patient ID: Elnora MorrisonPo S Wainright, female   DOB: September 01, 1955, 59 y.o.   MRN: [id number]   Reviewed team conference with pt's husband yesterday afternoon.  Aware that no change yet made with d/c date as we await results of FEES this morning.  Husband feels strongly that pt will not be ready for d/c end of week but will follow up with him today once FEES results are in.  Monserath Neff, LCSW
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
## 2014-07-19 NOTE — Progress Notes (Signed)
Occupational Therapy Session Note  Patient Details  Name: Tiffany Stevens MRN: [id number] Date of Birth: [birth year]  Today's Date: 07/19/2014 OT Individual Time: 0700-0800 and 1500-1530 OT Individual Time Calculation (min): 60 min and 30 min    Short Term Goals: Week 1:  OT Short Term Goal 1 (Week 1): Pt will complete UB dressing with min A to increase functional independence. OT Short Term Goal 1 - Progress (Week 1): Met OT Short Term Goal 2 (Week 1): Pt will complete LB dressing with min A to increase functional independence.  OT Short Term Goal 2 - Progress (Week 1): Met OT Short Term Goal 3 (Week 1): Pt will complete sit<>stand transfers at close supervision level to increase functional independence.  OT Short Term Goal 3 - Progress (Week 1): Met OT Short Term Goal 4 (Week 1): Pt will engage in functional activity in standing for 3 minutes to increase functional activity tolerance.  OT Short Term Goal 4 - Progress (Week 1): Met Week 2:  OT Short Term Goal 1 (Week 2): STG=LTG due to ELOS  Skilled Therapeutic Interventions/Progress Updates:    Session 1: Pt seen for 1:1 OT session with a focus on ADL retraining, functional mobility, dynamic standing balance, RUE strengthening and coordination, safety awareness, and activity tolerance. Pt received supine in bed with husband and MD present. Pt agreeable to complete ADL session at w/c level and shower tomorrow due to need for new cervical pads. Pt completed bed mobility and ambulation to w/c at close supervision level. Pt engaged in B/D at sink level via sit<>stand with SBA . Pt required min A to complete self-care today but able to complete with supervision however pt declines due to decreased volition. Pt then engaged in functional ambulation with supervision apprx 35' to therapy gym. Pt completed 3 sets of 10 grasp strengthening exercises. Pt then completed dynamic standing balance activity on foam pad with no LOB noted. Activity then graded to  complete ambulation while dribbling soccer ball. Pt demonstrated good balance strategies and completed activity in standing for 15 min with one rest break. Pt then ambulated apprx 75' back to room. Pt left seated in w/c with husband present and all other needs within reach.   Session 2: Pt seen for OT session with a focus on functional mobility, dynamic standing balance, RUE strengthening and coordination exercises, activity tolerance, and safety awareness. Pt received seated in w/c with RN present and agreeable to participate in therapy. Pt ambulated apprx 50' to day room to engage in seated hand strengthening activities. Therapeutic activities involved: in-hand manipulation skills, intrinsic muscle strengthening, finger isolation, and bilateral FM task. Pt demonstrates improved FM skills in RUE. Pt then completed standing balance activity of bowling. Pt demonstrated good balance strategies with transitional movements in standing. Pt then ambulated back to room and completed bed mobility at supervision level. Pt left supine in bed with all needs in reach.   Therapy Documentation Precautions:  Precautions Precautions: Cervical, Fall Precaution Comments: NG tube Required Braces or Orthoses: Cervical Brace, Sling Cervical Brace: Hard collar, At all times Restrictions Weight Bearing Restrictions: Yes LUE Weight Bearing: Non weight bearing General:   Vital Signs: Therapy Vitals Temp: 98.9 F (37.2 C) Temp Source: Oral Pulse Rate: 82 Resp: 19 BP: 115/67 mmHg Patient Position (if appropriate): Lying Oxygen Therapy SpO2: 98 % O2 Device: Not Delivered Pain: Pain Assessment Pain Assessment: 0-10 Pain Score: 5  Pain Type: Acute pain Pain Location: Neck Pain Orientation: Left Pain Descriptors / Indicators:  Aching Pain Onset: Awakened from sleep Pain Intervention(s): Medication (See eMAR) ADL:   Exercises:   Other Treatments:    See FIM for current functional status  Therapy/Group:  Individual Therapy  Dorann Ou 07/19/2014, 8:23 AM

## 2014-07-19 NOTE — Progress Notes (Signed)
Physical Therapy Vestibular Assessment Note  Patient Details  Name: Tiffany Stevens MRN: [id number] Date of Birth: [birth year]  Today's Date: 07/19/2014 PT Individual Time: [id number] PT Individual Time Calculation (min): 25 min   Short Term Goals: Week 1:  PT Short Term Goal 1 (Week 1): = LTGs of overall supervision Week 2:  PT Short Term Goal 1 (Week 2): = LTGs of overall supervision  Skilled Therapeutic Interventions/Progress Updates:   Pt reporting feelings of dizziness or "losing control"/dysequilibrium 5/10 when transitioning from supine <> sit and intermittently during rolling and from sit <> stand.  She reports that they resolve within 30 seconds to 1 minute.  Pt reports chronic low BP prior to injury.  Pt reports that the frequency of these episodes is decreasing.  Pt denies dizziness when sitting upright in w/c or during ambulation; denies sense of spinning/vertigo, denies changes in hearing, nausea and vomiting, denies headache, or diplopia.  She does report new blurring of vision in R eye since injury.              Glasses: Y  Hearing aids: N    Blood Pressure: N/T    Strength, Coordination and Sensation:  Reports decreased strength in RUE; reports tingling and "shocking"  sensation in R finger tips; finger to nose on R side presents with dysmetria and decreased speed  1. Vestibular Assessment                           Gross neck ROM  In c-collar  Eye Alignment WFL  Oculomotor ROM WFL  Spontaneous  Nystagmus (room light and vision occluded) None noted in room light  Gaze holding nystagmus(room light and vision occluded) None noted in room light  Smooth pursuit Saccadic intrusions in R eye  Saccades Decreased speed   Vergence Impaired in R eye  VOR Cancellation N/T due to cervical restrictions  Pressure Tests (vision occluded) N/T   VOR slow N/T due to cervical restrictions  Head Thrust Test N/T due to cervical restrictions  Head Shaking Test (vision occluded) N/T due to  cervical restrictions  Dynamic Visual Acuity         N/T due to cervical restrictions  Rt. Hallpike Dix Pt requested to defer to another day  Lt. Hallpike Dix Pt requested to defer to another day  Rt. Roll Test Pt requested to defer to another day  Lt. Roll Test  Pt requested to defer to another day  MSQ Pt requested to defer to another day  Cover-Cross Cover (if indicated) N/T  Head-Neck Differentiation Test (if indicated) N/T due to cervical restrictions   2. Findings:  Inconclusive due to mobility and ROM restrictions.  Due to nature of injury and immobilization pt likely to present with impaired gaze stabilization, motion sensitivity and possible BPPV.    3. Recommendations for Treatment: Perform modified positional testing in 1-2 days and perform modified treatment if pt tolerates, otherwise, follow up at outpatient if pt continues to c/o dizziness with transitional movements and pt is cleared for neck and shoulder ROM/mobility.    Therapy Documentation Precautions:  Precautions Precautions: Cervical, Fall Precaution Comments: NG tube Required Braces or Orthoses: Cervical Brace, Sling Cervical Brace: Hard collar, At all times Restrictions Weight Bearing Restrictions: Yes LUE Weight Bearing: Non weight bearing Pain:  C/o consistent pain in L upper trap/neck; premedicated.  See FIM for current functional status  Therapy/Group: Individual Therapy  Edman CircleHall, Panda Crossin [facility] 07/19/2014, 3:18 PM

## 2014-07-19 NOTE — Progress Notes (Signed)
Physical Therapy Session Note  Patient Details  Name: Tiffany Stevens MRN: [id number] Date of Birth: 02-27-1955  Today's Date: 07/19/2014 PT Individual Time: 0805-0900 and 1315-1400  PT Individual Time Calculation (min): 55 min and 45 min   Short Term Goals: Week 2:  PT Short Term Goal 1 (Week 2): = LTGs of overall supervision  Skilled Therapeutic Interventions/Progress Updates:   Session 1: Focus on outdoor and community ambulation and patient/family education. Patient received in wheelchair receiving breathing treatment. Patient's husband present for session. Readjusted patient's cervical collar for correct fit and improved head/neck alignment towards midline as well as passive ROM into L cervical lateral flexion towards midline.   Patient ambulated throughout rehab unit and outdoors on uneven surfaces, inclines/declines, brick, tile, and concrete surfaces, up/down 5 brick stairs using 1 rail with step-to pattern, and up/down curb step without device and overall supervision up to 200 ft at a time before requesting to rest in wheelchair. Patient declined sitting on community benches/furniture as well as ambulating on mulch or grass surfaces. Husband reported this was baseline for patient as she is a "germaphobe" and requires different shoes for "every activity." Patient with no overt LOB noted.   NuStep using BLE only at level 4 x 7 min for strengthening and activity tolerance.   Patient and family education with husband regarding DME needs as patient would like hospital bed and wheelchair for "practicality" but does not medically require DME due to ambulating without device for community distances and performing bed mobility on regular bed propped with pillows with supervision. Encouraged patient and family to allow patient to ambulate without device and limit amount of time that patient "holds on" to family as well as encourage patient to stay active/ambulate around home after discharge to increase  independence and challenge balance. Discussed recommendation for outpatient PT upon discharge, husband in agreement but patient requesting home health. Continue education with patient/family.     Patient left sitting in wheelchair with quick release belt on to return to room with husband.    Session 2: Patient missed 15 min at beginning of session due to lab draws. Patient transferred to edge of bed on R side with HOB slightly elevated with supervision. Patient ambulated to day room without device with supervision/assist for IV pole. Patient ambulated along counter to water plants using small watering can in RUE with supervision. Stair training up/down 12 6" steps using R rail with step-to progressed to reciprocal pattern with supervision/assist for IV pole. For sequencing, coordination, and single leg balance, patient instructed to touch floor plates in different number/color patterns with RLE then LLE with 90% accuracy. Patient negotiated obstacle course weaving between cones, on compliant surface, over thresholds and up/down 4" step without device with overall supervision and one LOB on step requiring mod A to recover and cues for safe pace. Patient then negotiated up/down 4" step x 4 without UE support and supervision. To challenge dynamic standing balance, patient passed soccer ball back and forth to son-in-law while ambulating back to room with supervision. Patient left sitting in wheelchair with family friend present and call bell in lap.   Therapy Documentation Precautions:  Precautions Precautions: Cervical, Fall Precaution Comments: NG tube Required Braces or Orthoses: Cervical Brace, Sling Cervical Brace: Hard collar, At all times Restrictions Weight Bearing Restrictions: Yes LUE Weight Bearing: Non weight bearing General: PT Amount of Missed Time (min): 5 Minutes PT Missed Treatment Reason: Unavailable (Comment) (breathing treatment)  Missed Time: 15 minutes Reason: Lab  draws  Pain: Pain Assessment Pain Assessment: No/denies pain Locomotion : Ambulation Ambulation/Gait Assistance: 5: Supervision   See FIM for current functional status  Therapy/Group: Individual Therapy  Kerney ElbeVarner, Mildreth Reek A 07/19/2014, 8:55 AM

## 2014-07-20 ENCOUNTER — Inpatient Hospital Stay (HOSPITAL_COMMUNITY): Payer: 59 | Admitting: Speech Pathology

## 2014-07-20 ENCOUNTER — Inpatient Hospital Stay (HOSPITAL_COMMUNITY): Payer: 59

## 2014-07-20 ENCOUNTER — Inpatient Hospital Stay (HOSPITAL_COMMUNITY): Payer: 59 | Admitting: Occupational Therapy

## 2014-07-20 ENCOUNTER — Inpatient Hospital Stay (HOSPITAL_COMMUNITY): Payer: 59 | Admitting: Physical Therapy

## 2014-07-20 DIAGNOSIS — R1314 Dysphagia, pharyngoesophageal phase: Secondary | ICD-10-CM

## 2014-07-20 LAB — CBC
HCT: 32.6 % — ABNORMAL LOW (ref 36.0–46.0)
Hemoglobin: 10.7 g/dL — ABNORMAL LOW (ref 12.0–15.0)
MCH: 32.2 pg (ref 26.0–34.0)
MCHC: 32.8 g/dL (ref 30.0–36.0)
MCV: 98.2 fL (ref 78.0–100.0)
Platelets: 285 10*3/uL (ref 150–400)
RBC: 3.32 MIL/uL — AB (ref 3.87–5.11)
RDW: 17.4 % — ABNORMAL HIGH (ref 11.5–15.5)
WBC: 5.5 10*3/uL (ref 4.0–10.5)

## 2014-07-20 LAB — BASIC METABOLIC PANEL
Anion gap: 10 (ref 5–15)
BUN: 9 mg/dL (ref 6–20)
CALCIUM: 8.4 mg/dL — AB (ref 8.9–10.3)
CO2: 24 mmol/L (ref 22–32)
CREATININE: 0.42 mg/dL — AB (ref 0.44–1.00)
Chloride: 102 mmol/L (ref 101–111)
GFR calc Af Amer: >60 mL/min (ref >60)
GFR calc non Af Amer: >60 mL/min (ref >60)
GLUCOSE: 99 mg/dL (ref 65–99)
Potassium: 3.6 mmol/L (ref 3.5–5.1)
Sodium: 136 mmol/L (ref 135–145)

## 2014-07-20 LAB — GLUCOSE, CAPILLARY
GLUCOSE-CAPILLARY: 112 mg/dL — AB (ref 65–99)
GLUCOSE-CAPILLARY: 97 mg/dL (ref 65–99)
Glucose-Capillary: 67 mg/dL (ref 65–99)
Glucose-Capillary: 96 mg/dL (ref 65–99)

## 2014-07-20 MED ORDER — SODIUM CHLORIDE 0.9 % IV SOLN
INTRAVENOUS | Status: AC | PRN
  Administered 2014-07-20: 50 mL/h via INTRAVENOUS

## 2014-07-20 MED ORDER — GLUCAGON HCL RDNA (DIAGNOSTIC) 1 MG IJ SOLR
INTRAMUSCULAR | Status: AC
  Filled 2014-07-20: qty 1

## 2014-07-20 MED ORDER — IOHEXOL 300 MG/ML  SOLN
50.0000 mL | Freq: Once | INTRAMUSCULAR | Status: AC | PRN
  Administered 2014-07-20: 20 mL

## 2014-07-20 MED ORDER — MIDAZOLAM HCL 2 MG/2ML IJ SOLN
INTRAMUSCULAR | Status: AC
  Filled 2014-07-20: qty 2

## 2014-07-20 MED ORDER — FENTANYL CITRATE (PF) 100 MCG/2ML IJ SOLN
INTRAMUSCULAR | Status: AC
  Filled 2014-07-20: qty 2

## 2014-07-20 MED ORDER — MIDAZOLAM HCL 2 MG/2ML IJ SOLN
INTRAMUSCULAR | Status: AC | PRN
  Administered 2014-07-20: 1 mg via INTRAVENOUS

## 2014-07-20 MED ORDER — CEFAZOLIN SODIUM 1-5 GM-% IV SOLN
1.0000 g | Freq: Once | INTRAVENOUS | Status: AC
  Administered 2014-07-20: 1 g via INTRAVENOUS

## 2014-07-20 MED ORDER — CEFAZOLIN SODIUM 1-5 GM-% IV SOLN
INTRAVENOUS | Status: AC
  Filled 2014-07-20: qty 50

## 2014-07-20 MED ORDER — FENTANYL CITRATE (PF) 100 MCG/2ML IJ SOLN
INTRAMUSCULAR | Status: AC | PRN
  Administered 2014-07-20: 50 ug via INTRAVENOUS
  Administered 2014-07-20: 12.5 ug via INTRAVENOUS

## 2014-07-20 MED ORDER — LORAZEPAM 2 MG/ML IJ SOLN: 0.5000 mg | Freq: Four times a day (QID) | INTRAMUSCULAR | Status: AC | PRN

## 2014-07-20 MED ORDER — GLUCAGON HCL RDNA (DIAGNOSTIC) 1 MG IJ SOLR
INTRAMUSCULAR | Status: AC | PRN
  Administered 2014-07-20: 1 mg via INTRAVENOUS

## 2014-07-20 MED ORDER — LORAZEPAM 0.5 MG PO TABS: 0.5000 mg | ORAL_TABLET | Freq: Four times a day (QID) | ORAL | Status: AC | PRN
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
## 2014-07-20 NOTE — Progress Notes (Signed)
Called Pam Love, PA to clarify if we are to use the new peg tube tonight for medications. Pam said to just administer the IV medications for tonight. Also, she ordered me to reorder the Ativan order for tonight only- 0.5mg  IM injection q6 PRN.

## 2014-07-20 NOTE — Progress Notes (Signed)
Speech Language Pathology Daily Session Note  Patient Details  Name: Tiffany Stevens MRN: [id number] Date of Birth: [birth year]  Today's Date: 07/20/2014 SLP Individual Time: 1105-1200 SLP Individual Time Calculation (min): 55 min  Short Term Goals: Week 2: SLP Short Term Goal 1 (Week 2): Patient will consume ice chips with no more than 3 swallows per trial and minimal overy s/s of aspiration to demonstrate readiness for repeat objective assessment.  SLP Short Term Goal 2 (Week 2): Patient will produce phrase level verbal expression with Mod cues for increased vocal intensity.  SLP Short Term Goal 3 (Week 2): Patient with demonstrate basic safety awareness and problem solving by requesting help as needed with Supervision level question cues during self-care tasks.  SLP Short Term Goal 4 (Week 2): Patient will recall and perform pharyngeal strenghtening exercises with Min verbal cues.   Skilled Therapeutic Interventions: Skilled treatment session focused on addressing speech and dysphagia goals as well as education.  Patient and husband present for session and asked appropriate questions about swallow impairments which SLP answered with verbal and visual information.  SLP also facilitated session by providing Min assist multimodal cues to recall previously taught pharyngeal strengthening exercises and Min verbal cues to accurately complete.  Patient also benefited from Max faded to Mod verbal cues for increased vocal intensity during phrase level verbal expression.  Continue with current plan of care.    FIM:  Comprehension Comprehension Mode: Auditory Comprehension: 5-Follows basic conversation/direction: With extra time/assistive device Expression Expression Mode: Verbal Expression: 2-Expresses basic 25 - 49% of the time/requires cueing 50 - 75% of the time. Uses single words/gestures. Social Interaction Social Interaction: 4-Interacts appropriately 75 - 89% of the time - Needs redirection for  appropriate language or to initiate interaction. Problem Solving Problem Solving: 5-Solves basic 90% of the time/requires cueing < 10% of the time Memory Memory: 4-Recognizes or recalls 75 - 89% of the time/requires cueing 10 - 24% of the time  Pain Pain Assessment Pain Assessment: No/denies pain  Therapy/Group: Individual Therapy  Charlane FerrettiMelissa  Cohick, M.A., CCC-SLP [phone]  Lindamarie Maclachlan 07/20/2014, 12:18 PM

## 2014-07-20 NOTE — Sedation Documentation (Signed)
C/O Left shoulder discomfort and left sided abdominal pain.  Spoke with Dr Grace IsaacWatts, Fentanyl 12.5 given.

## 2014-07-20 NOTE — Procedures (Signed)
Successful fluoroscopic guided insertion of gastrostomy tube without immediate post procedural complicatoin.   The gastrostomy tube may be used immediately for medications.  Tube feeds may be initiated in 24 hours as per the primary team.   

## 2014-07-20 NOTE — Progress Notes (Signed)
Occupational Therapy Note  Patient Details  Name: Tiffany Stevens MRN: [id number] Date of Birth: 22-Jan-1955  Today's Date: 07/20/2014 OT Individual Time: 1030-1100 OT Individual Time Calculation (min): 30 min   Pt c/o 2/10 headache; RN aware Individual Therapy  Pt amb without AD to day room and engaged in RUE FM and coordination activities.  Pt initially stacked checkers with no difficulty and transitioned to "hand gym" to increase AROM of UE and wrist/hand.  Pt issued theraputty with 10 beads placed in putty.  Pt challenged with removing beads using right hand only.  Pt completed task but c/o hand fatigue.  Pt amb back to room and remained in w/c awaiting SLP.    Tiffany Stevens, Tiffany Stevens [facility] 07/20/2014, 12:11 PM

## 2014-07-20 NOTE — Progress Notes (Signed)
Physical Therapy Session Note  Patient Details  Name: Tiffany Stevens MRN: [id number] Date of Birth: 1955-05-16  Today's Date: 07/20/2014 PT Individual Time: 0800-0900 PT Individual Time Calculation (min): 60 min   Short Term Goals: Week 2:  PT Short Term Goal 1 (Week 2): = LTGs of overall supervision  Skilled Therapeutic Interventions/Progress Updates:   Patient resting in wheelchair upon arrival, cervical collar tightened due to loose fit around chin with verbal cues to keep head at midline. Husband present for session. Patient required mod coaxing to participate in session due to having PEG placed later today and wanting to skip therapy with c/o feeling tired this AM. Patient ambulated without device room <> therapy gym with supervision. Patient instructed in OTAGO HEP for balance, strengthening, and falls prevention using back of chair for RUE support: heel raises x 20, knee flexion 2 x 10 each LE, marching x 40, squats 2 x 10 with cues/demonstration for technique, and hip abduction 2 x 10 each LE. Provided handout for HEP. Patient given supine rest break and transferred sit <> supine to R on mat table with supervision and verbal cues for pushing up with RUE to elevate trunk as patient reached out for therapist to pull her into seated position. In standing, completed pipeline tree puzzles of mild difficulty with no cues and mild-moderate difficulty with max multimodal cues for problem solving and sequencing and ignored therapist's cues. Patient maintained standing while completing pipeline tree puzzle x 15 min. After educating patient and husband on deficits noted during task, patient stated, "Stop saying I have a brain injury, I'm just tired." Patient returned to room and left semi reclined in bed with all needs within reach.   Therapy Documentation Precautions:  Precautions Precautions: Cervical, Fall Precaution Comments: NG tube Required Braces or Orthoses: Cervical Brace, Sling Cervical  Brace: Hard collar, At all times Restrictions Weight Bearing Restrictions: Yes LUE Weight Bearing: Non weight bearing Pain: Pain Assessment Pain Assessment: No/denies pain  See FIM for current functional status  Therapy/Group: Individual Therapy  Kerney ElbeVarner, [name] 07/20/2014, 9:28 AM

## 2014-07-20 NOTE — Progress Notes (Signed)
Occupational Therapy Session Note  Patient Details  Name: Tiffany Stevens S Preyer MRN: [id number] Date of Birth: [birth year]  Today's Date: 07/20/2014 OT Individual Time: 1330-1400 OT Individual Time Calculation (min): 30 min    Short Term Goals: Week 2:  OT Short Term Goal 1 (Week 2): STG=LTG due to ELOS  Skilled Therapeutic Interventions/Progress Updates:    Pt seen for OT therapy session focusing on fine motor coordination and R hand strengthening. Pt sitting up in w/c upon arrival, agreeable to tx. Pt ambulated to therapy day room with supervision and assist to manage IV pole. In day room, pt provided with HEP and soft "yellow" theraputty. Pt provided verbal direction and demonstration for putty exercises including in hand manipulation, all pinch positions, removing beads from putty, and gross grasp exercises. Pt return demonstrated all exercises, completing exercises x10 reps each.  Pt became fatigued during end of session requiring increased encouragement to cont with therapy. She ambulated back to room at end of session and requested toileting task. Toileting completed with supervision, pt completing pericare from seated position with anterior approach for hygiene. She completed standing hand hygiene and returned to bed with supervision. Pt left in supine at end of session, al needs in reach and bed alarm activated.     Therapy Documentation Precautions:  Precautions Precautions: Cervical, Fall Precaution Comments: NG tube Required Braces or Orthoses: Cervical Brace, Sling Cervical Brace: Hard collar, At all times Restrictions Weight Bearing Restrictions: Yes LUE Weight Bearing: Non weight bearing Pain: Pain Assessment Pain Assessment: No/denies pain  See FIM for current functional status  Therapy/Group: Individual Therapy  Lewis, Cameran Ahmed C 07/20/2014, 11:25 AM

## 2014-07-20 NOTE — Progress Notes (Signed)
[place] PHYSICAL MEDICINE & REHABILITATION     PROGRESS NOTE    Subjective/Complaints: Slept off and on. Feels better with NGT out. Understands that she will have PEG place today  ROS: ROS: Pt denies fever, rash/itching,  blurred or double vision, nausea, vomiting, abdominal pain, diarrhea, chest pain, shortness of breath, palpitations, dysuria, dizziness,  bleeding, anxiety, or depression  Objective: Vital Signs: Blood pressure 111/66, pulse 79, temperature 98.5 F (36.9 C), temperature source Oral, resp. rate 18, weight 64.501 kg (142 lb 3.2 oz), SpO2 97 %. No results found.  Recent Labs  07/19/14 1430 07/20/14 0521  WBC 6.7 5.5  HGB 11.2* 10.7*  HCT 34.0* 32.6*  PLT 323 285    Recent Labs  07/20/14 0521  NA 136  K 3.6  CL 102  GLUCOSE 99  BUN 9  CREATININE 0.42*  CALCIUM 8.4*   CBG (last 3)   Recent Labs  07/19/14 2135 07/19/14 2350 07/20/14 0654  GLUCAP 96 90 112*    Wt Readings from Last 3 Encounters:  07/19/14 64.501 kg (142 lb 3.2 oz)  07/09/14 52.9 kg (116 lb 10 oz)  08/03/07 52.223 kg (115 lb 2.1 oz)    Physical Exam:  Constitutional: She is oriented to person, place, and time. She appears well-developed and well-nourished.     HENT:  Small scab along right nare. Tongue ulcerations decreasing. phonation better Eyes: Pupils are equal, round, and reactive to light. No abnl seen in right eye.  Trach site already closed with minimal drainage. Cardiovascular: Regular rhythm.  Rate controlled.     Respiratory: Effort normal. No respiratory distress.  Normal breathing effort. GI: Soft. Bowel sounds are normal. She exhibits no distension. There is no tenderness.  Musculoskeletal: She exhibits tenderness (left shoulder which is improved. ). She exhibits no edema.  Left anterior shoulder tenderness persistent but better Neurological: She is alert and oriented to person, place, and time. She displays normal reflexes. She exhibits normal muscle  tone.      She was able to follow basic motor commands without difficulty. Able to attend to task without difficulty today. attention better.   Left shoulder 1+/5 due to pain. biceps 4-/5,   3+/5 triceps, grip 3+/5 Right shoulder 4/5, biceps 45, triceps 2+/5, grip 3/5.  Bilateral LE: HF 4/5, KE 4/5, ADF/APF 4+.  Skin: Skin is warm and dry.  Diffuse ecchymosis left shoulder and bilateral knees are improving. Bruises and abrasions on face/scalp much improved  Psychiatric: Her affect is more appropriate.   Assessment/Plan: 1. 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 Physiatrist is providing close team supervision and 24 hour management of active medical problems listed below. Physiatrist and rehab team continue to assess barriers to discharge/monitor patient progress toward functional and medical goals.   FIM: FIM - Bathing Bathing Steps Patient Completed: Chest, Right Arm, Left Arm, Front perineal area, Left upper leg, Right upper leg, Right lower leg (including foot), Left lower leg (including foot), Abdomen Bathing: 4: Min-Patient completes 8-9 [id number] 10 parts or 75+ percent  FIM - Upper Body Dressing/Undressing Upper body dressing/undressing steps patient completed: Thread/unthread right sleeve of pullover shirt/dresss, Pull shirt over trunk, Thread/unthread left sleeve of pullover shirt/dress Upper body dressing/undressing: 4: Min-Patient completed 75 plus % of tasks FIM - Lower Body Dressing/Undressing Lower body dressing/undressing steps patient completed: Thread/unthread right pants leg, Thread/unthread left pants leg, Don/Doff right sock,  Don/Doff left sock, Pull pants up/down Lower body dressing/undressing: 5: Supervision: Safety issues/verbal cues  FIM - Toileting Toileting steps completed by patient: Adjust clothing prior to toileting, Performs  perineal hygiene, Adjust clothing after toileting Toileting Assistive Devices: Grab bar or rail for support Toileting: 4: Steadying assist  FIM - Diplomatic Services operational officer Devices: Grab bars Toilet Transfers: 5-To toilet/BSC: Supervision (verbal cues/safety issues), 5-From toilet/BSC: Supervision (verbal cues/safety issues)  FIM - Banker Devices: HOB elevated Bed/Chair Transfer: 5: Chair or W/C > Bed: Supervision (verbal cues/safety issues), 5: Bed > Chair or W/C: Supervision (verbal cues/safety issues)  FIM - Locomotion: Wheelchair Locomotion: Wheelchair: 1: Total Assistance/staff pushes wheelchair (Pt<25%) FIM - Locomotion: Ambulation Locomotion: Ambulation Assistive Devices: Other (comment) (none) Ambulation/Gait Assistance: 5: Supervision Locomotion: Ambulation: 5: Travels 150 ft or more with supervision/safety issues  Comprehension Comprehension Mode: Auditory Comprehension: 5-Follows basic conversation/direction: With extra time/assistive device  Expression Expression Mode: Verbal Expression Assistive Devices: 6-Other (Comment) (whispers and writes on Public affairs consultant) Expression: 2-Expresses basic 25 - 49% of the time/requires cueing 50 - 75% of the time. Uses single words/gestures.  Social Interaction Social Interaction: 4-Interacts appropriately 75 - 89% of the time - Needs redirection for appropriate language or to initiate interaction.  Problem Solving Problem Solving: 5-Solves basic 90% of the time/requires cueing < 10% of the time  Memory Memory: 4-Recognizes or recalls 75 - 89% of the time/requires cueing 10 - 24% of the time  Medical Problem List and Plan: 1. 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 DVT Prophylaxis/Anticoagulation: Mechanical: Sequential compression devices, below knee Bilateral lower extremities---dopplers negative 3. Headaches/ Pain Management: Continue Duragesic patch with hydrocodone prn.  -improving pain control in general 4. Mood: LCSW to follow for support and evaluation as cognition/ability to communicate improves. 5. Neuropsych: This patient is not fully capable of making decisions on her own behalf. 6. Skin/Wound Care: Routine pressure relief measures.  7. Fluids/Electrolytes/Nutrition/Dysphagia: Monitor I/O. Continue tube feeds with increased water flushes.       -failed FEES---PEG today. Family/pt on board 8. VDRF:  -stoma/wound almost closed 9. C2 nondisplaced fracture including type 3 odontoid fracture: Continue Cervical collar for support.  10. ABLA: Resolving. hgb 10.7-11.2 11. Leucocytosis: Monitor for signs of infection.   12. Hyperglycemia:  SSI to help manage BS.  13. Enterobacter PNA: ceftriaxone completed 14. Urinary retention:     -ucx with 100k enterococcus sens to amoxil completed  15. Distal left clavicle fx---place in shoulder sling, pain control, no pain to palpation over this area  -may participate in basic self care tasks with LUE, avoid use of left arm for transfers at this point 16. Fibronecrotic tongue ulcers:  Continue  oral care with hydrogen perioxide tid as well as Peridex rinses.  -improving    LOS (Days) 10 A FACE TO FACE EVALUATION WAS PERFORMED  SWARTZ,ZACHARY T 07/20/2014 7:23 AM

## 2014-07-20 NOTE — Progress Notes (Signed)
Occupational Therapy Session Note  Patient Details  Name: Tiffany Stevens MRN: [id number] Date of Birth: [birth year]  Today's Date: 07/20/2014 OT Individual Time: 0700-0800 OT Individual Time Calculation (min): 60 min    Short Term Goals: Week 1:  OT Short Term Goal 1 (Week 1): Pt will complete UB dressing with min A to increase functional independence. OT Short Term Goal 1 - Progress (Week 1): Met OT Short Term Goal 2 (Week 1): Pt will complete LB dressing with min A to increase functional independence.  OT Short Term Goal 2 - Progress (Week 1): Met OT Short Term Goal 3 (Week 1): Pt will complete sit<>stand transfers at close supervision level to increase functional independence.  OT Short Term Goal 3 - Progress (Week 1): Met OT Short Term Goal 4 (Week 1): Pt will engage in functional activity in standing for 3 minutes to increase functional activity tolerance.  OT Short Term Goal 4 - Progress (Week 1): Met Week 2:  OT Short Term Goal 1 (Week 2): STG=LTG due to ELOS  Skilled Therapeutic Interventions/Progress Updates:    Pt seen for 1:1 OT session with a focus on functional mobility, ADL retraining, activity tolerance, safety awareness, and pt/family education. Pt received seated in bed with RN present. Pt agreeable to participate in B/D. Pt ambulated around room to obtain items for shower at SBA. Pt completed toilet transfer, toileting, and shower transfer all at close supervision. Pt completed bathing sit<>stand via shower seat using the grab bar. Pt demonstrated good safety awareness in shower but required min cues for safety ambulating from BR to w/c. Pt completed dressing sit<>stand with min A-set up. Therapist donned LUE sling and pt returned to bed for cervical collar pad change. Therapist educated husband and pt on technique for cervical pad change in supine and provided handout for written instruction. Pt's husband distracted during education and would benefit from continued education on  cervical collar. Pt completed oral care in standing with good standing balance. Therapist provided HEP for R hand strengthening/coordination exercises to be discussed in more detail before discharge. Husband requesting hospital bed at home. Therapist discussed that hospital bed is not a medical necessity at this time due to higher level functioning. Noted increased engagement and participation in session today. Pt left seated in w/c with husband present and call bell within reach.    Therapy Documentation Precautions:  Precautions Precautions: Cervical, Fall Precaution Comments: NG tube Required Braces or Orthoses: Cervical Brace, Sling Cervical Brace: Hard collar, At all times Restrictions Weight Bearing Restrictions: Yes LUE Weight Bearing: Non weight bearing General:   Vital Signs: Therapy Vitals Temp: 98.5 F (36.9 C) Temp Source: Oral Pulse Rate: 79 BP: 111/66 mmHg Patient Position (if appropriate): Lying Oxygen Therapy SpO2: 97 % O2 Device: Not Delivered Pain: Pain Assessment Pain Assessment: No/denies pain Pain Score: 2  Pain Type: Acute pain Pain Location: Head Pain Orientation: Right Pain Descriptors / Indicators: Aching Pain Frequency: Intermittent Pain Onset: Awakened from sleep Pain Intervention(s): Medication (See eMAR) 2nd Pain Site Pain Score: 5 Pain Type: Acute pain Pain Location: Neck Pain Orientation: Left Pain Descriptors / Indicators: Aching Pain Frequency: Intermittent Pain Onset: Awakened from sleep Pain Intervention(s): Medication (See eMAR) ADL:   Exercises:   Other Treatments:    See FIM for current functional status  Therapy/Group: Individual Therapy  Dorann Ou 07/20/2014, 9:07 AM

## 2014-07-20 NOTE — Progress Notes (Signed)
Speech Language Pathology Daily Session Note  Patient Details  Name: Tiffany Stevens MRN: [id number] Date of Birth: 11-20-1955  Today's Date: 07/20/2014 SLP Individual Time: 1430-1500 SLP Individual Time Calculation (min): 30 min  Short Term Goals: Week 2: SLP Short Term Goal 1 (Week 2): Patient will consume ice chips with no more than 3 swallows per trial and minimal overy s/s of aspiration to demonstrate readiness for repeat objective assessment.  SLP Short Term Goal 2 (Week 2): Patient will produce phrase level verbal expression with Mod cues for increased vocal intensity.  SLP Short Term Goal 3 (Week 2): Patient with demonstrate basic safety awareness and problem solving by requesting help as needed with Supervision level question cues during self-care tasks.  SLP Short Term Goal 4 (Week 2): Patient will recall and perform pharyngeal strenghtening exercises with Min verbal cues.   Skilled Therapeutic Interventions:  Pt was seen for skilled ST targeting dysphagia goals.  Upon arrival, pt was sitting upright in bed, awake, alert, and agreeable to participate in ST.  Pt returned demonstration of pharyngeal strengthening exercises with min verbal cues and written list.  Anjolina trials were withheld due to imminent PEG placement this PM.  During functional conversations with SLP, pt required min verbal cues for increased vocal intensity to achieve intelligibility.  Pt was stimulable for brief production of vocal cord adduction exercises with min demonstration cues.  Pt was left in bed, with bed alarm set, and all needs left within reach.  Continue per current plan of care.    FIM:  Comprehension Comprehension Mode: Auditory Comprehension: 5-Follows basic conversation/direction: With extra time/assistive device Expression Expression Mode: Verbal Expression: 2-Expresses basic 25 - 49% of the time/requires cueing 50 - 75% of the time. Uses single words/gestures. Social Interaction Social Interaction:  4-Interacts appropriately 75 - 89% of the time - Needs redirection for appropriate language or to initiate interaction. Problem Solving Problem Solving: 5-Solves basic 90% of the time/requires cueing < 10% of the time Memory Memory: 4-Recognizes or recalls 75 - 89% of the time/requires cueing 10 - 24% of the time  Pain Pain Assessment Pain Assessment: No/denies pain  Therapy/Group: Individual Therapy  Dontee Jaso, Melanee SpryNicole L 07/20/2014, 3:57 PM

## 2014-07-20 NOTE — Sedation Documentation (Signed)
O2 2l/Rea placed, OG placed by Leandra KernBecky Frye, RT

## 2014-07-21 ENCOUNTER — Inpatient Hospital Stay (HOSPITAL_COMMUNITY): Payer: 59 | Admitting: Occupational Therapy

## 2014-07-21 ENCOUNTER — Inpatient Hospital Stay (HOSPITAL_COMMUNITY): Payer: 59 | Admitting: Physical Therapy

## 2014-07-21 ENCOUNTER — Inpatient Hospital Stay (HOSPITAL_COMMUNITY): Payer: 59 | Admitting: Speech Pathology

## 2014-07-21 LAB — GLUCOSE, CAPILLARY
GLUCOSE-CAPILLARY: 163 mg/dL — AB (ref 65–99)
GLUCOSE-CAPILLARY: 192 mg/dL — AB (ref 65–99)
GLUCOSE-CAPILLARY: 75 mg/dL (ref 65–99)
GLUCOSE-CAPILLARY: 90 mg/dL (ref 65–99)
Glucose-Capillary: 80 mg/dL (ref 65–99)
Glucose-Capillary: 102 mg/dL — ABNORMAL HIGH (ref 65–99)

## 2014-07-21 MED ORDER — JEVITY 1.2 CAL PO LIQD
350.0000 mL | Freq: Four times a day (QID) | ORAL | Status: DC
  Administered 2014-07-21 – 2014-07-22 (×2): 350 mL
  Administered 2014-07-22: 250 mL
  Administered 2014-07-22 – 2014-07-24 (×8): 350 mL
  Filled 2014-07-21 (×19): qty 474

## 2014-07-21 MED ORDER — FENTANYL 25 MCG/HR TD PT72: MEDICATED_PATCH | TRANSDERMAL | Status: DC

## 2014-07-21 MED ORDER — DEXTROSE-NACL 5-0.45 % IV SOLN
INTRAVENOUS | Status: DC
  Administered 2014-07-21 – 2014-07-22 (×2): via INTRAVENOUS
  Administered 2014-07-23: 1000 mL via INTRAVENOUS

## 2014-07-21 MED ORDER — CHLORHEXIDINE GLUCONATE 0.12 % MT SOLN: 15.0000 mL | Freq: Two times a day (BID) | OROMUCOSAL | Status: DC

## 2014-07-21 MED ORDER — ALBUTEROL SULFATE (2.5 MG/3ML) 0.083% IN NEBU: 2.5000 mg | INHALATION_SOLUTION | Freq: Four times a day (QID) | RESPIRATORY_TRACT | Status: DC | PRN

## 2014-07-21 MED ORDER — POLYETHYLENE GLYCOL 3350 17 G PO PACK: 17.0000 g | PACK | Freq: Two times a day (BID) | ORAL | Status: DC

## 2014-07-21 MED ORDER — FENTANYL 50 MCG/HR TD PT72: 50.0000 ug | MEDICATED_PATCH | TRANSDERMAL | Status: DC

## 2014-07-21 MED ORDER — FREE WATER
150.0000 mL | Freq: Three times a day (TID) | Status: DC
  Administered 2014-07-21 – 2014-07-24 (×10): 150 mL

## 2014-07-21 MED ORDER — PANTOPRAZOLE SODIUM 40 MG PO PACK: 40.0000 mg | PACK | Freq: Every day | ORAL | Status: DC

## 2014-07-21 MED ORDER — SIMETHICONE 40 MG/0.6ML PO SUSP: 80.0000 mg | Freq: Four times a day (QID) | ORAL | Status: DC

## 2014-07-21 MED ORDER — ALPRAZOLAM 0.25 MG PO TABS: ORAL_TABLET | ORAL | Status: DC

## 2014-07-21 MED ORDER — HYDROCODONE-ACETAMINOPHEN 7.5-325 MG/15ML PO SOLN: 15.0000 mL | Freq: Four times a day (QID) | ORAL | Status: DC | PRN

## 2014-07-21 MED ORDER — HYDROGEN PEROXIDE 3 % EX SOLN: 1.0000 "application " | Freq: Three times a day (TID) | CUTANEOUS | Status: DC

## 2014-07-21 MED ORDER — LORATADINE 10 MG PO TABS: 10.0000 mg | ORAL_TABLET | Freq: Every day | ORAL | Status: DC | PRN

## 2014-07-21 MED ORDER — DEXTROSE 50 % IV SOLN
1.0000 | Freq: Once | INTRAVENOUS | Status: AC
  Administered 2014-07-21: 50 mL via INTRAVENOUS

## 2014-07-21 MED ORDER — ALBUTEROL SULFATE HFA 108 (90 BASE) MCG/ACT IN AERS: 1.0000 | INHALATION_SPRAY | RESPIRATORY_TRACT | Status: DC | PRN

## 2014-07-21 MED ORDER — MUSCLE RUB 10-15 % EX CREA: 1.0000 "application " | TOPICAL_CREAM | Freq: Three times a day (TID) | CUTANEOUS | Status: DC

## 2014-07-21 MED ORDER — DEXTROSE 50 % IV SOLN
INTRAVENOUS | Status: AC
  Administered 2014-07-21: 50 mL
  Filled 2014-07-21: qty 50

## 2014-07-21 MED ORDER — ONDANSETRON HCL 4 MG PO TABS: 4.0000 mg | ORAL_TABLET | Freq: Three times a day (TID) | ORAL | Status: DC | PRN

## 2014-07-21 NOTE — Plan of Care (Signed)
Problem: RH SKIN INTEGRITY Goal: RH STG ABLE TO PERFORM INCISION/WOUND CARE W/ASSISTANCE STG Able To Perform Incision/Wound Care With max Assistance.  Outcome: Progressing Peg tube site

## 2014-07-21 NOTE — Progress Notes (Signed)
Speech Language Pathology Daily Session Note  Patient Details  Name: Tiffany Stevens S Pagnotta MRN: [id number] Date of Birth: 1955-04-05  Today's Date: 07/21/2014 SLP Individual Time: 1345-1400 SLP Individual Time Calculation (min): 15 min  Short Term Goals: Week 2: SLP Short Term Goal 1 (Week 2): Patient will consume ice chips with no more than 3 swallows per trial and minimal overy s/s of aspiration to demonstrate readiness for repeat objective assessment.  SLP Short Term Goal 2 (Week 2): Patient will produce phrase level verbal expression with Mod cues for increased vocal intensity.  SLP Short Term Goal 3 (Week 2): Patient with demonstrate basic safety awareness and problem solving by requesting help as needed with Supervision level question cues during self-care tasks.  SLP Short Term Goal 4 (Week 2): Patient will recall and perform pharyngeal strenghtening exercises with Min verbal cues.   Skilled Therapeutic Interventions:  Pt was seen for skilled ST targeting dysphagia goals.  Session began late due to IV nursing care and PEG care.  Upon arrival, pt was supine in bed, awake, lethargic but agreeable to participate in ST.  Positioning for Jorie trials wasn't ideal due to pain at PEG site; however, pt was agreeable to elevating head of bed and being propped up on pillows to maximize safe swallowing during limited Shayanne trials of ice chips.  Pt completed thorough oral care with min tactile cues for finger occlusion of suction toothette prior to ice chips.  Pt consumed x1 ice chip with immediate, weak, congested cough and persistent congested respirations for several minutes after ice chip was consumed.  No further ice chips were administered.  Pt was able to complete x10 repetitions of the effortful swallow technique with supervision with dry swallows.  Pt left in bed, head of bed elevated >30 degrees.  Call bell left within reach, bed alarm activated.    FIM:  Comprehension Comprehension Mode:  Auditory Comprehension: 5-Follows basic conversation/direction: With extra time/assistive device Expression Expression Mode: Verbal Expression: 3-Expresses basic 50 - 74% of the time/requires cueing 25 - 50% of the time. Needs to repeat parts of sentences. Social Interaction Social Interaction: 4-Interacts appropriately 75 - 89% of the time - Needs redirection for appropriate language or to initiate interaction. Problem Solving Problem Solving: 5-Solves basic 90% of the time/requires cueing < 10% of the time Memory Memory: 4-Recognizes or recalls 75 - 89% of the time/requires cueing 10 - 24% of the time FIM - Eating Eating Activity: 5: Supervision/cues (with trials )  Pain Pain Assessment Pain Assessment: No/denies pain  Therapy/Group: Individual Therapy  Rasheedah Reis, Melanee SpryNicole L 07/21/2014, 2:59 PM
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
## 2014-07-21 NOTE — Progress Notes (Signed)
Physical Therapy Session Note  Patient Details  Name: Tiffany Stevens MRN: [id number] Date of Birth: 12-23-1955  Today's Date: 07/21/2014 PT Individual Time: 1105-1205 PT Individual Time Calculation (min): 60 min   Short Term Goals: Week 2:  PT Short Term Goal 1 (Week 2): = LTGs of overall supervision  Skilled Therapeutic Interventions/Progress Updates:   Patient resting in wheelchair upon arrival. Discussed with patient's husband and CSW patient need for hospital bed for reflux with PEG tube feeds and reinforced that patient does not need wheelchair as she is able to consistently ambulate > 150 ft with supervision, patient verbalized agreement. Patient ambulated without device x 170 ft with supervision. In stairwell, patient negotiated up/down flight of stairs using R rail with self-elected step-to pattern and supervision. For strengthening and activity tolerance, performed NuStep using BLE only at level 3-4 x 10 min with rest breaks as needed. Patient reported BLE feeling "tired," therefore performed supine passive ROM BLE of hamstrings, hip ER/IR's, hip abductors, and heel cords. Patient performed bed mobility on mat table to R with supervision. To challenge dynamic standing balance, patient completed alternating step taps to cone on compliant surface with sidestepping to R and L, min Stevens > supervision. Patient ambulated back to room while kicking yoga block with supervision and left sitting in wheelchair with quick release belt on and needs within reach, family notified that patient back in room.   Therapy Documentation Precautions:  Precautions Precautions: Cervical, Fall Precaution Comments: NG tube Required Braces or Orthoses: Cervical Brace, Sling Cervical Brace: Hard collar, At all times Restrictions Weight Bearing Restrictions: Yes LUE Weight Bearing: Non weight bearing Pain: Pain Assessment Pain Assessment: No/denies pain  See FIM for current functional status  Therapy/Group:  Individual Therapy  Tiffany Stevens, Tiffany Stevens 07/21/2014, 12:19 PM

## 2014-07-21 NOTE — Progress Notes (Signed)
Physical Therapy Session Note  Patient Details  Name: Tiffany Stevens MRN: [id number] Date of Birth: 26-Sep-1955  Today's Date: 07/21/2014 PT Individual Time: 1015-1035 PT Individual Time Calculation (min): 20 min   Short Term Goals: Week 1:  PT Short Term Goal 1 (Week 1): = LTGs of overall supervision Week 2:  PT Short Term Goal 1 (Week 2): = LTGs of overall supervision  Skilled Therapeutic Interventions/Progress Updates:   Upon arrival PA, MD and RN present discussing D/C date and pt overall complaints.  PA and MD encouraging pt to perform OOB activity.  Pt declined to continue vestibular assessment due to soreness in abdomen but pt agreeable to get OOB for toileting.  Performed supine >sit from flat bed with no rails to R side rolling to R side first with supervision. Required prolonged time EOB secondary to dizziness; will likely not be able to perform positional testing prior to D/C; recommending f/u at outpatient once D/C for continued vestibular evaluation and treatment.  Performed stand pivot bed <> BSC supervision and performed all toileting tasks supervision.  Pt requesting to end therapy session with transfer to w/c to await next therapist.  Set up w/c and pt performed ambulation in room and transfer to w/c supervision.  Pt set up in w/c with all items within reach and family present to supervise until next session.  Pt concerned about not having hospital bed at home and having to sleep with out HOB elevated to 30 deg.  Discussed with pt that the [facility] was when she was on continuous tube feeding via NG tube.  Pt educated on feeding schedule with PEG by MD, education reinforced by PT.  Pt to sit in w/c until 11am PT session.    Therapy Documentation Precautions:  Precautions Precautions: Cervical, Fall Precaution Comments: NG tube Required Braces or Orthoses: Cervical Brace, Sling Cervical Brace: Hard collar, At all times Restrictions Weight Bearing Restrictions: Yes LUE  Weight Bearing: Non weight bearing General: PT Amount of Missed Time (min): 10 Minutes PT Missed Treatment Reason: Patient fatigue Vital Signs: Oxygen Therapy SpO2: 98 % O2 Device: Not Delivered Pain: Pain Assessment Pain Assessment: No/denies pain Locomotion : Ambulation Ambulation/Gait Assistance: 5: Supervision   See FIM for current functional status  Therapy/Group: Individual Therapy  Edman CircleHall, Brynden Thune [facility] 07/21/2014, 11:41 AM
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
## 2014-07-21 NOTE — Progress Notes (Signed)
Percutaneous G-tube site with dry, old blood. Site cleaned, new dressing applied. skin around peg site slightly pink. Tolerated use of medication throughout the day, advanced to tube feeding . Initial dose of 150 ml given. Nurse to increase to 100 ml q 6hrs to goal rate of 350 ml QID, per order. Pt tolerated tube feeding well.

## 2014-07-21 NOTE — Progress Notes (Signed)
Marissa NestlePam Love, PA notified of pts CBG of 67. Ordered to give D50 dextrose and recheck. Pt is asymptomatic. Will continue to monitor. Rudie MeyerEurillo, Nitzia Perren A, RN

## 2014-07-21 NOTE — Significant Event (Signed)
Hypoglycemic Event  CBG: 67  Treatment: D50 IV 50 mL  Symptoms: None  Follow-up CBG: Time:0042 CBG Result:192  Possible Reasons for Event: Other: pt NPO for peg tube placement on 6/30   Comments/MD notified: Marissa NestlePam Love, PA.     Cheryln ManlyEurillo, Keri Veale A  Remember to initiate Hypoglycemia Order Set & complete

## 2014-07-21 NOTE — Progress Notes (Signed)
Occupational Therapy Session Note  Patient Details  Name: Tiffany Stevens MRN: [id number] Date of Birth: [birth year]  Today's Date: 07/21/2014 OT Individual Time:  - 0830-   0845  (15 min) (Missed 75 min due to fatigue)             Short Term Goals: Week 1:  OT Short Term Goal 1 (Week 1): Pt will complete UB dressing with min A to increase functional independence. OT Short Term Goal 1 - Progress (Week 1): Met OT Short Term Goal 2 (Week 1): Pt will complete LB dressing with min A to increase functional independence.  OT Short Term Goal 2 - Progress (Week 1): Met OT Short Term Goal 3 (Week 1): Pt will complete sit<>stand transfers at close supervision level to increase functional independence.  OT Short Term Goal 3 - Progress (Week 1): Met OT Short Term Goal 4 (Week 1): Pt will engage in functional activity in standing for 3 minutes to increase functional activity tolerance.  OT Short Term Goal 4 - Progress (Week 1): Met Week 2:  OT Short Term Goal 1 (Week 2): STG=LTG due to ELOS  Skilled Therapeutic Interventions/Progress Updates:        1st sessionDiscussed treatment plan.for am session.  Pt complained of  No sleep and refused.  Informed RN and MD Skilled Therapeutic Interventions/Progress Updates:     2nd session:    1630-1715  (45 min)      Pt seen for OT therapy session focusing on fine motor coordination and R hand strengthening, mobility, and endurance. Pt sitting up in w/c upon arrival, agreeable to tx. Pt ambulated to [facility] gym with supervision and assist to manage IV pole. In day room, Addressed 9 hole per with right hand.  Did peg test in one minute.   Did connect four with no problems with manipulations.  Pt. Stood for 20 minutes with SBA.  Pt. Wrote name on board with some diffiffiulty applying pressure.  Ambulated back to room  Used theraputty for last 5 minutes.  Positioned in wc with all needs in reach.   Therapy Documentation Precautions:  Precautions Precautions: Cervical,  Fall Precaution Comments: NG tube Required Braces or Orthoses: Cervical Brace, Sling Cervical Brace: Hard collar, At all times Restrictions Weight Bearing Restrictions: Yes LUE Weight Bearing: Non weight bearing      Pain:  None  For 2nd session            See FIM for current functional status  Therapy/Group: Individual Therapy  Lisa Roca 07/21/2014, 8:13 AM

## 2014-07-21 NOTE — Progress Notes (Signed)
Patient ID: [id number] Tiffany Stevens, female   DOB: Jun 08, 1955, 59 y.o.   MRN: [id number]   Percutaneous G tube placed 6/30 afeb [facility] wnl RN has used for meds already today Reports site is clean and dry NT  May use now for feeds

## 2014-07-22 ENCOUNTER — Inpatient Hospital Stay (HOSPITAL_COMMUNITY): Payer: 59 | Admitting: Occupational Therapy

## 2014-07-22 ENCOUNTER — Inpatient Hospital Stay (HOSPITAL_COMMUNITY): Payer: 59 | Admitting: Speech Pathology

## 2014-07-22 ENCOUNTER — Inpatient Hospital Stay (HOSPITAL_COMMUNITY): Payer: 59 | Admitting: Physical Therapy

## 2014-07-22 DIAGNOSIS — S069X4D Unspecified intracranial injury with loss of consciousness 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, subsequent encounter: Secondary | ICD-10-CM

## 2014-07-22 DIAGNOSIS — S12100G Unspecified displaced fracture of second cervical vertebra, subsequent encounter for fracture with delayed healing: Secondary | ICD-10-CM

## 2014-07-22 LAB — GLUCOSE, CAPILLARY
GLUCOSE-CAPILLARY: 122 mg/dL — AB (ref 65–99)
Glucose-Capillary: 108 mg/dL — ABNORMAL HIGH (ref 65–99)
Glucose-Capillary: 118 mg/dL — ABNORMAL HIGH (ref 65–99)
Glucose-Capillary: 134 mg/dL — ABNORMAL HIGH (ref 65–99)
Glucose-Capillary: 134 mg/dL — ABNORMAL HIGH (ref 65–99)

## 2014-07-22 NOTE — Progress Notes (Signed)
Occupational Therapy Session Note  Patient Details  Name: Elnora Morrisono S Binning MRN: [id number] Date of Birth: Jan 15, 1956  Today's Date: 07/22/2014 OT Individual Time: 4098-11911045-1215 OT Individual Time Calculation (min): 90 min    Skilled Therapeutic Interventions/Progress Updates: ADL in room shower, covering right arm IV and peg site and keeping water away from collar.    Patient overall close supervision for balance, short rest breaks, and she requested assist with to comb hair in back.   She c/o of UE fatigue when lifting arms greater than 45 degress should flexion.        Therapy Documentation Precautions:  Precautions Precautions: Cervical, Fall Precaution Comments: NG tube Required Braces or Orthoses: Cervical Brace, Sling Cervical Brace: Hard collar, At all times Restrictions Weight Bearing Restrictions: Yes LUE Weight Bearing: Non weight bearing  Pain: denied   See FIM for current functional status  Therapy/Group: Individual Therapy  Bud Faceickett, Shakora Nordquist [facility] 07/22/2014, 4:10 PM

## 2014-07-22 NOTE — Progress Notes (Signed)
Speech Language Pathology Daily Session Note  Patient Details  Name: Elnora Morrisono S Blanck MRN: [id number] Date of Birth: August 17, 1955  Today's Date: 07/22/2014 SLP Individual Time: [id number] SLP Individual Time Calculation (min): 45 min  Short Term Goals: Week 2: SLP Short Term Goal 1 (Week 2): Patient will consume ice chips with no more than 3 swallows per trial and minimal overy s/s of aspiration to demonstrate readiness for repeat objective assessment.  SLP Short Term Goal 2 (Week 2): Patient will produce phrase level verbal expression with Mod cues for increased vocal intensity.  SLP Short Term Goal 3 (Week 2): Patient with demonstrate basic safety awareness and problem solving by requesting help as needed with Supervision level question cues during self-care tasks.  SLP Short Term Goal 4 (Week 2): Patient will recall and perform pharyngeal strenghtening exercises with Min verbal cues.   Skilled Therapeutic Interventions: Skilled ST intervention provided with focus on dysphagia and cognitive goals. Pt seen in room for ST tx session, family present. Pt seated upright in bed, 90 degrees. Slp assisted with thorough oral care with toothette and mouth rinse. Pt able to swish and spit with water, no assist. Trials of ice chips administered (4 total). Pt noted with continuous throat clear and weak cough with each trial. Verbal cues required to clear throat and re-swallow. Pt reported that she felt likes something was stuck in her throat. Phar/laryngeal exercises completed x5 each with min A. Cognitive goals address through problem solving activity. Pt required min A to locate call bell. Pt stated 4 reasons to use call bell to request assistance, independently. Pt was 70% accurate with simple PS activity, min-mod verbal cues required.    FIM:  Comprehension Comprehension Mode: Auditory Comprehension: 5-Follows basic conversation/direction: With extra time/assistive device Expression Expression Mode:  Verbal Expression: 3-Expresses basic 50 - 74% of the time/requires cueing 25 - 50% of the time. Needs to repeat parts of sentences. Social Interaction Social Interaction: 4-Interacts appropriately 75 - 89% of the time - Needs redirection for appropriate language or to initiate interaction. Problem Solving Problem Solving: 4-Solves basic 75 - 89% of the time/requires cueing 10 - 24% of the time Memory Memory: 4-Recognizes or recalls 75 - 89% of the time/requires cueing 10 - 24% of the time FIM - Eating Eating Activity: 5: Supervision/cues (trials ice chips)  Pain Pain Assessment Pain Assessment: No/denies pain  Therapy/Group: Individual Therapy  Yolande Skoda, Kara PacerLeah N 07/22/2014, 9:02 AM

## 2014-07-22 NOTE — Progress Notes (Signed)
Physical Therapy Session Note  Patient Details  Name: Tiffany Stevens MRN: [id number] Date of Birth: 1955/08/08  Today's Date: 07/22/2014 PT Individual Time: 0900-1000 PT Individual Time Calculation (min): 60 min   Short Term Goals: Week 2:  PT Short Term Goal 1 (Week 2): = LTGs of overall supervision  Skilled Therapeutic Interventions/Progress Updates:    Gait Training: PT instructs pt in ambulation on level surface in controlled environment without AD x 150' x 2 reps req SBA for safety and verbal cues to slow down with small LOB - all self corrected. PT manages IV pole attached to pt.  PT instructs pt in ascending/descending corner flight of stairs with one handrail - 1 hand to 2 hands prn on handrail in step-to to step-over-step pattern, prn as pt wishes - all req SBA for safety.   Neuromuscular Reeducation:  Five times Sit to Stand Test (FTSS) Method: Use a straight back chair with a solid seat that is 16-18" high. Ask participant to sit on the chair with arms folded across their chest.   Instructions: "Stand up and sit down as quickly as possible 5 times, keeping your arms folded across your chest."   Measurement: Stop timing when the participant stands the 5th time.  TIME: __16.8____ (in seconds)  Times > 13.6 seconds is associated with increased disability and morbidity (Guralnik, 2000) Times > 15 seconds is predictive of recurrent falls in healthy individuals aged 61 and older (Buatois, et al., 2008) Normal performance values in community dwelling individuals aged 460 and older (Bohannon, 2006): o 60-69 years: 11.4 seconds o 70-79 years: 12.6 seconds o 80-89 years: 14.8 seconds  MCID: ? 2.3 seconds for Vestibular Disorders (Meretta, 2006)  PT instructs pt in TUG x 3 attempts (one trial attempt as well) without AD (PT pushes IV pole) and pt scores: 17.36 sec, 14.65 sec, and 13.81 seconds. AVG: 15.27 PT instructs pt in Cognitive TUG x 3 attempts in above manner and pt scores:  19.58 sec (count backwards from 80), 18.15 sec (count backwards from 65), and 18.03 sec (count backwards from 74). AVG: 18.59 sec  PT administers Berg Balance Test and pt scores: 48/56, and improvement from 4 days ago when she scored 41/56.   Daughter and SIL present in room upon PT arrival - PT educates daughter and pt to use gaze stabilization and rest between transitional movements to decrease dizziness. PT has pt pick out items to fixate gaze upon during transitional movements to demonstrate understanding. Dtr and SIL leave after pt arrives in gym to let pt focus on therapy. Pt is progressing with functional mobility - req encouragement to participate throughout PT session due to fatigue. Continue per PT POC.    Therapy Documentation Precautions:  Precautions Precautions: Cervical, Fall Precaution Comments: NG tube Required Braces or Orthoses: Cervical Brace, Sling Cervical Brace: Hard collar, At all times Restrictions Weight Bearing Restrictions: Yes LUE Weight Bearing: Non weight bearing Pain: Pain Assessment Pain Assessment: 0-10 Pain Score: 5  Pain Type: Acute pain Pain Location: Neck Pain Orientation: Right;Left Pain Descriptors / Indicators: Aching;Pressure Pain Onset: On-going Pain Intervention(s): Rest;Emotional support Multiple Pain Sites: No  Balance: Balance Balance Assessed: Yes Standardized Balance Assessment Standardized Balance Assessment: Timed Up and Go Test;Berg Balance Test Berg Balance Test Sit to Stand: Able to stand without using hands and stabilize independently Standing Unsupported: Able to stand safely 2 minutes Sitting with Back Unsupported but Feet Supported on Floor or Stool: Able to sit safely and securely 2 minutes Stand  to Sit: Sits safely with minimal use of hands Transfers: Able to transfer safely, minor use of hands Standing Unsupported with Eyes Closed: Able to stand 10 seconds safely Standing Ubsupported with Feet Together: Able to  place feet together independently and stand for 1 minute with supervision From Standing, Reach Forward with Outstretched Arm: Can reach confidently >25 cm (10") From Standing Position, Pick up Object from Floor: Able to pick up shoe, needs supervision From Standing Position, Turn to Look Behind Over each Shoulder: Looks behind one side only/other side shows less weight shift Turn 360 Degrees: Able to turn 360 degrees safely one side only in 4 seconds or less Standing Unsupported, Alternately Place Feet on Step/Stool: Able to stand independently and safely and complete 8 steps in 20 seconds Standing Unsupported, One Foot in Front: Able to plae foot ahead of the other independently and hold 30 seconds Standing on One Leg: Tries to lift leg/unable to hold 3 seconds but remains standing independently Total Score: 48 Timed Up and Go Test TUG: Normal TUG;Cognitive TUG Normal TUG (seconds): 16 Cognitive TUG (seconds): 19  See FIM for current functional status  Therapy/Group: Individual Therapy  Rhyland Hinderliter M 07/22/2014, 9:14 AM

## 2014-07-22 NOTE — Progress Notes (Signed)
Patient ID: 1, female   DOB: 01-Mar-1955, 59 y.o.   MRN: [id number]   [facility]     PROGRESS NOTE   07/22/14.  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 Was able to sleep.  Daughter at bedside  ROS: ROS: Pt denies fever, rash/itching,  blurred or double vision, nausea, vomiting, abdominal pain, diarrhea, chest pain, shortness of breath, palpitations, dysuria, dizziness,  bleeding, anxiety, or depression  Past Medical History  Diagnosis Date  . Osteoporosis   . Seasonal allergies   . Constipation   . Hemorrhoids     Patient Vitals for the past 24 hrs:  BP Temp Temp src Pulse Resp SpO2 Weight  07/22/14 0627 106/69 mmHg 98.2 F (36.8 C) Oral 80 18 98 % 107 lb 6.4 oz (48.716 kg)  07/22/14 0034 107/63 mmHg 98.5 F (36.9 C) Oral 74 18 98 % -  07/21/14 1448 113/71 mmHg 99 F (37.2 C) Oral 85 17 99 % -    No intake or output data in the 24 hours ending 07/22/14 0806   Objective: Vital Signs: Blood pressure 106/69, pulse 80, temperature 98.2 F (36.8 C), temperature source Oral, resp. rate 18, weight 107 lb 6.4 oz (48.716 kg), SpO2 98 %. Ir Gastrostomy Tube Mod Sed  07/20/2014   INDICATION: Dysphagia. Please place gastrostomy tube for enteric nutrition supplementation.  EXAM: PULL TROUGH GASTROSTOMY TUBE PLACEMENT  COMPARISON:  CT abdomen pelvis - 09/16/2011  MEDICATIONS: Ancef 1 g IV  Antibiotics were administered within 1 hour of the procedure.  CONTRAST:  20 mL of Isovue 300 administered into the gastric lumen.  ANESTHESIA/SEDATION: Versed 1 mg IV; Fentanyl 50 mcg IV  Sedation time  18 minutes  FLUOROSCOPY TIME:  6 minutes 48 seconds (82.5 mGy)  COMPLICATIONS: None immediate  PROCEDURE: Informed written consent was  obtained from the patient following explanation of the procedure, risks, benefits and alternatives. A time out was performed prior to the initiation of the procedure. Ultrasound scanning was performed to demarcate the edge of the left lobe of the liver. Maximal barrier sterile technique utilized including caps, mask, sterile gowns, sterile gloves, large sterile drape, hand hygiene and Betadine prep.  The left upper quadrant was sterilely prepped and draped. An oral gastric catheter was inserted into the stomach under fluoroscopy. The existing nasogastric feeding tube was removed. The left costal margin and air opacified transverse colon were identified and avoided. Air was injected into the stomach for insufflation and visualization under fluoroscopy. Under sterile conditions a 17 gauge trocar needle was utilized to access the stomach percutaneously beneath the left subcostal margin after the overlying soft tissues were anesthetized with 1% Lidocaine with epinephrine. Needle position was confirmed within the stomach with aspiration of air and injection of small amount of contrast. A single T tack was deployed for gastropexy. Over an Amplatz guide wire, a 9-French sheath was inserted into the stomach. A snare device was utilized to capture the oral gastric catheter. The snare device was pulled retrograde from the stomach up the esophagus and out the oropharynx. The 20-French pull-through gastrostomy was connected to the snare device and pulled antegrade through the oropharynx down the esophagus into the stomach and then through the percutaneous tract external to the patient. The gastrostomy was assembled  externally. Contrast injection confirms position in the stomach. Several spot radiographic images were obtained in various obliquities for documentation. The patient tolerated procedure well without immediate post procedural complication.  FINDINGS: After successful fluoroscopic guided placement, the gastrostomy tube  is appropriately positioned with internal disc against the ventral aspect of the gastric lumen.  IMPRESSION: Successful fluoroscopic insertion of a 20-French pull-through gastrostomy tube.  The gastrostomy may be used immediately for medication administration and in 24 hrs for the initiation of feeds.   Electronically Signed   By: Simonne ComeJohn  Watts M.D.   On: 07/20/2014 17:20    Recent Labs  07/19/14 1430 07/20/14 0521  WBC 6.7 5.5  HGB 11.2* 10.7*  HCT 34.0* 32.6*  PLT 323 285    Recent Labs  07/20/14 0521  NA 136  K 3.6  CL 102  GLUCOSE 99  BUN 9  CREATININE 0.42*  CALCIUM 8.4*   CBG (last 3)   Recent Labs  07/21/14 2057 07/22/14 0030 07/22/14 0604  GLUCAP 163* 134* 118*    Wt Readings from Last 3 Encounters:  07/22/14 107 lb 6.4 oz (48.716 kg)  07/09/14 116 lb 10 oz (52.9 kg)  08/03/07 115 lb 2.1 oz (52.223 kg)   CBG (last 3)   Recent Labs  07/21/14 2057 07/22/14 0030 07/22/14 0604  GLUCAP 163* 134* 118*     Physical Exam:  Constitutional: She is oriented to person, place, and time. She appears well-developed and well-nourished.     HENT:  Phonation better but still hoarse Eyes: Pupils are equal, round, and reactive to light. No abnl seen in right eye.  Janina Mayorach site already closed Cardiovascular: Regular rhythm.  Rate controlled.     Respiratory: Effort normal. No respiratory distress.  Normal breathing effort. GI: Soft. Bowel sounds are normal. PEG site clean, appropriately tender. No drainage and minimal blood Musculoskeletal: She exhibits tenderness (left shoulder which is improved. ). She exhibits no edema.  Left anterior shoulder tenderness persistent but better Neurological: She is alert and oriented to person, place, and time. She displays normal reflexes. She exhibits normal muscle tone.  Left shoulder 1+/5 limited due to pain/clavicle. biceps 4-/5,   3+/5 triceps, grip 3+/5 Right shoulder 4/5, biceps 45, triceps 2+/5, grip 3/5.  Bilateral LE: HF  4/5, KE 4/5, ADF/APF 4+. No edema Skin: Skin is warm and dry.  Diffuse ecchymosis left shoulder and bilateral knees are improving. Bruises and abrasions on face/scalp much improved  Psychiatric: Her affect is more appropriate.    Medical Problem List and Plan: 1. 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 Skin/Wound Care: Routine pressure relief measures, oob. PEG site clean  3. Fluids/Electrolytes/Nutrition/Dysphagia:   -s/p PEG   4.  VDRF:  -stoma closed  -stable respiratory status 5. C2 nondisplaced fracture including type 3 odontoid fracture: Continue Cervical collar for support.  6. ABLA:  hgb 10.7-11.2---recheck on Monday 7. Leucocytosis: resolved 8. Hyperglycemia:  SSI to help manage BS.   9. Urinary retention:     -ucx with 100k enterococcus sens to amoxil completed  10. Distal left clavicle fx---continue shoulder sling, pain control. Overall improved. Pt knows limits  -may participate in basic self care tasks with LUE, avoid use of left arm for transfers at this point    LOS (Days) 12 A FACE TO FACE  EVALUATION WAS PERFORMED  Rogelia Boga 07/22/2014 8:04 AM

## 2014-07-23 ENCOUNTER — Inpatient Hospital Stay (HOSPITAL_COMMUNITY): Payer: 59

## 2014-07-23 ENCOUNTER — Inpatient Hospital Stay (HOSPITAL_COMMUNITY): Payer: 59 | Admitting: Physical Therapy

## 2014-07-23 LAB — GLUCOSE, CAPILLARY
GLUCOSE-CAPILLARY: 122 mg/dL — AB (ref 65–99)
GLUCOSE-CAPILLARY: 123 mg/dL — AB (ref 65–99)
GLUCOSE-CAPILLARY: 96 mg/dL (ref 65–99)

## 2014-07-23 NOTE — Progress Notes (Signed)
Physical Therapy Session Note  Patient Details  Name: Tiffany Stevens S Mealing MRN: [id number] Date of Birth: Sep 02, 1955  Today's Date: 07/23/2014 PT Individual Time: 0900-1000 PT Individual Time Calculation (min): 60 min   Short Term Goals: Week 2:  PT Short Term Goal 1 (Week 2): = LTGs of overall supervision  Skilled Therapeutic Interventions/Progress Updates:   Patient currently requires supervision for all functional mobility including bed mobility on R side with HOB flat, ambulation without device, and stairs using 1 rail. Patient demonstrates decreased risk of falls as noted by improvement on Berg Balance Scale with score of 48/56 from 41/56 on 07/18/14, TUG Cognitive with time of 19 sec improved from 29 sec on 07/12/14, and 5 Times Sit to Stand = 16 sec with S from 18 sec with min A on 07/12/14. Patient performed simulated car transfer to sedan height with supervision for IV pole and no cues for safety. Stair training up/down 12 steps using RUE support on rail with supervision, step-to pattern. Patient ambulated throughout rehab unit pushing IV pole with supervision multiple trials in controlled and home environments up to 500 ft. Patient requested to return to room due to neck pain. RN notified of request for pain medication. Patient/family education provided to husband and daughter [place] Sink[place]ita addressing proper fit/adjustment of cervical collar and to complete OTAGO HEP 1x/day after discharge. Discussed f/u recommendation for OPPT, all verbalized understanding. Patient left sitting in bedside chair with call bell within reach and family present.   Therapy Documentation Precautions:  Precautions Precautions: Cervical, Fall Precaution Comments: PEG Required Braces or Orthoses: Cervical Brace, Sling Cervical Brace: Hard collar, At all times Restrictions Weight Bearing Restrictions: Yes LUE Weight Bearing: Non weight bearing (sling for comfort) Pain: Pain Assessment Pain Assessment: 0-10 Pain Score: 5   Pain Type: Acute pain Pain Location: Shoulder Pain Orientation: Left Pain Descriptors / Indicators: Aching Pain Onset: Gradual Pain Intervention(s): Medication (See eMAR) Locomotion : Ambulation Ambulation/Gait Assistance: 5: Supervision   See FIM for current functional status  Therapy/Group: Individual Therapy  Kerney ElbeVarner, Rondrick Barreira A 07/23/2014, 10:06 AM

## 2014-07-23 NOTE — Progress Notes (Signed)
Patient ID: 47, female   DOB: [birth year], 59 y.o.   MRN: [id number]   Patient ID: Tiffany Stevens, female   DOB: 01-18-1956, 59 y.o.   MRN: [id number]   [name]     PROGRESS NOTE   07/23/14.  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  Continues to receive IVF thru the night.  ROS: ROS: Pt denies fever, rash/itching,  blurred or double vision, nausea, vomiting, abdominal pain, diarrhea, chest pain, shortness of breath, palpitations, dysuria, dizziness,  bleeding, anxiety, or depression  Past Medical History  Diagnosis Date  . Osteoporosis   . Seasonal allergies   . Constipation   . Hemorrhoids     Patient Vitals for the past 24 hrs:  BP Temp Temp src Pulse Resp SpO2  07/23/14 0610 104/63 mmHg 98.4 F (36.9 C) Oral 79 18 99 %  07/23/14 0205 (!) 93/48 mmHg 98.2 F (36.8 C) Oral 73 18 99 %  07/22/14 2201 103/69 mmHg 98.5 F (36.9 C) Oral 89 18 98 %  07/22/14 1344 (!) 87/51 mmHg 98.4 F (36.9 C) Oral 95 18 99 %     Intake/Output Summary (Last 24 hours) at 07/23/14 0740 Last data filed at 07/23/14 0600  Gross per 24 hour  Intake   1350 ml  Output      0 ml  Net   1350 ml         Objective: Vital Signs: Blood pressure 104/63, pulse 79, temperature 98.4 F (36.9 C), temperature source Oral, resp. rate 18, weight 107 lb 6.4 oz (48.716 kg), SpO2 99 %. No results found. No results for input(s): WBC, HGB, HCT, PLT in the last 72 hours. No results for input(s): NA, K, CL, GLUCOSE, BUN, CREATININE, CALCIUM in the last 72 hours.  Invalid input(s): CO CBG (last 3)   Recent Labs  07/22/14 1709 07/22/14 2324 07/23/14 0613  GLUCAP 108* 122* 122*    Wt Readings from Last 3 Encounters:  07/22/14 107 lb  6.4 oz (48.716 kg)  07/09/14 116 lb 10 oz (52.9 kg)  08/03/07 115 lb 2.1 oz (52.223 kg)   CBG (last 3)   Recent Labs  07/22/14 1709 07/22/14 2324 07/23/14 0613  GLUCAP 108* 122* 122*     Physical Exam:  Constitutional: She is oriented to person, place, and time. She appears well-developed and well-nourished.     HENT:  Cervical collar in place Phonation better but still hoarse Eyes: Pupils are equal, round, and reactive to light. No abnl seen in right eye.  Janina Mayo site already closed Cardiovascular: Regular rhythm.  Rate controlled.     Respiratory: Effort normal. No respiratory distress.  Normal breathing effort. GI: Soft. Bowel sounds are normal. PEG site clean, appropriately tender. No drainage and minimal blood  Neurological: She is alert and oriented to person, place, and time. She displays normal reflexes. She exhibits normal muscle tone.   Skin: Skin is warm and dry.  Diffuse ecchymosis left shoulder and bilateral knees are improving. Bruises and abrasions on face/scalp much improved  Psychiatric: Her affect is more appropriate.    Medical Problem List and Plan: 1. 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 C2 nondisplaced fracture including type 3 odontoid fracture: Continue Cervical collar for support.   3. Hyperglycemia:  SSI to help manage BS. Controlled   4.  Urinary retention:     -ucx with 100k enterococcus sens to amoxil -completed  5. Distal left clavicle fx---continue shoulder sling, pain control. Overall improved. Pt knows limits      LOS (Days) 13 A FACE TO FACE EVALUATION WAS PERFORMED  Rogelia BogaKWIATKOWSKI,PETER FRANK 07/23/2014 7:40 AM

## 2014-07-23 NOTE — Progress Notes (Signed)
Occupational Therapy Session Note  Patient Details  Name: Tiffany Stevens MRN: [id number] Date of Birth: August 17, 1955  Today's Date: 07/23/2014 OT Individual Time: 1100-1200 OT Individual Time Calculation (min): 60 min    Short Term Goals: Week 2:  OT Short Term Goal 1 (Week 2): STG=LTG due to ELOS  Skilled Therapeutic Interventions/Progress Updates:    Pt seen for ADL retraining with focus on family training, standing balance, functional mobility, safety awareness, and education. Pt received sitting in chair with husband present. Pt declining bathing at shower level this AM. Completed bathing sit<>stand at sink with supervision and min cues for safety. Pt completed oral care in standing for apprx 2 min. Transitioned to supine in bed and therapist provided education to husband on cervical collar management and changing pads. Husband demonstrated ability to change pads with increased time and cues for sequencing. Husband appeared slightly frustrated with activity and reported he would have home health therapists assist with this task. Discussed importance of caregiver feeling comfortable with cervical collar care however husband declined further education stating "I can figure it out." Therapist provided handout with written and visual instructions for cervical collar care. Pt ambulated apprx 1675' to day room and completed 9 hole peg test with R hand. Pt progressed to completing task in 53 seconds. Pt ambulated back to room at supervision level and left sitting in chair with husband. Pt and husband with no questions at this time in regards to discharge.   Therapy Documentation Precautions:  Precautions Precautions: Cervical, Fall Precaution Comments: PEG Required Braces or Orthoses: Cervical Brace, Sling Cervical Brace: Hard collar, At all times Restrictions Weight Bearing Restrictions: Yes LUE Weight Bearing: Non weight bearing (sling  AAT) General:   Vital Signs:   Pain: Pain Assessment Pain  Assessment: 0-10 Pain Score: 2  Pain Type: Acute pain Pain Location: Shoulder Pain Orientation: Left Pain Descriptors / Indicators: Aching Pain Onset: Gradual Pain Intervention(s): Medication (See eMAR)  See FIM for current functional status  Therapy/Group: Individual Therapy  Daneil Danerkinson, Anden Bartolo N 07/23/2014, 12:04 PM

## 2014-07-23 NOTE — Progress Notes (Addendum)
Physical Therapy Discharge Summary  Patient Details  Name: Tiffany Stevens MRN: [id number] Date of Birth: 12-28-1955  Today's Date: 07/25/2014  Patient has met 7 of 7 long term goals due to improved activity tolerance, improved balance, improved postural control, increased strength, increased range of motion, decreased pain, ability to compensate for deficits, functional use of RUE, improved attention, improved awareness and improved coordination.  Patient to discharge at an ambulatory level Supervision.   Patient's care partner is independent to provide the necessary physical and cognitive assistance at discharge.  Reasons goals not met: NA  Recommendation:  Patient will benefit from ongoing skilled PT services in home health setting progressed to outpatient therapy to continue to advance safe functional mobility, address ongoing impairments in dynamic balance, postural control, coordination, strength, gaze stabilization due to vestibular symptoms, activity tolerance, and minimize fall risk.  Equipment: hospital bed due to reflux (PEG placement)  Reasons for discharge: treatment goals met and discharge from hospital  Patient/family agrees with progress made and goals achieved: Yes  PT Discharge Precautions/Restrictions Precautions Precautions: Cervical;Fall Precaution Comments: PEG Required Braces or Orthoses: Cervical Brace;Sling Cervical Brace: Hard collar;At all times Restrictions Weight Bearing Restrictions: Yes LUE Weight Bearing: Non weight bearing (sling for comfort) Pain Pain Assessment Pain Assessment: 0-10 Pain Score: 5  Pain Type: Acute pain Pain Location: Neck Pain Orientation: Posterior;Left Pain Descriptors / Indicators: Aching Pain Onset: On-going Pain Intervention(s): Emotional support;Repositioned Cognition Overall Cognitive Status: Impaired/Different from baseline Arousal/Alertness: Awake/alert Orientation Level: Oriented X4 Attention: Selective Sustained  Attention: Appears intact Awareness: Impaired Awareness Impairment: Emergent impairment Executive Function: Sequencing Behaviors: Impulsive Safety/Judgment: Impaired [employer] Scales of Cognitive Functioning: Automatic/appropriate Sensation Sensation Light Touch: Impaired Detail Light Touch Impaired Details: Impaired RUE;Impaired RLE Stereognosis: Not tested Hot/Cold: Appears Intact Proprioception: Appears Intact Coordination Gross Motor Movements are Fluid and Coordinated: No Coordination and Movement Description: limited by precautions and pain Motor  Motor Motor: Abnormal postural alignment and control Motor - Discharge Observations: cervical lateral flexion to R, able to correct with cuing   Mobility Bed Mobility Bed Mobility: Sit to Supine;Supine to Sit Supine to Sit: 5: Supervision;HOB flat Supine to Sit Details: Verbal cues for technique Sit to Supine: 5: Supervision;HOB flat Transfers Transfers: Yes Sit to Stand: 6: Modified independent (Device/Increase time);Without upper extremity assist Stand to Sit: 6: Modified independent (Device/Increase time);Without upper extremity assist Locomotion  Ambulation Ambulation: Yes Ambulation/Gait Assistance: 5: Supervision Ambulation Distance (Feet): 200 Feet Assistive device: None Gait Gait: Yes Gait Pattern: Impaired Gait Pattern: Decreased stride length;Lateral trunk lean to right;Lateral trunk lean to left;Trunk flexed;Narrow base of support Gait velocity: 10 MWT = 1.35 m/s Stairs / Additional Locomotion Stairs: Yes Stairs Assistance: 5: Supervision Stair Management Technique: One rail Right;Step to pattern;Forwards Number of Stairs: 12 Height of Stairs: 6 Ramp: 5: Supervision Curb: 5: Supervision Wheelchair Mobility Wheelchair Mobility: No (pt ambulatory)  Trunk/Postural Assessment  Cervical Assessment Cervical Assessment: Exceptions to [facility] (lateral flexion to R) Thoracic Assessment Thoracic Assessment:  Within Functional Limits Lumbar Assessment Lumbar Assessment: Within Functional Limits Postural Control Postural Control: Within Functional Limits Protective Responses: improved from eval  Balance Balance Balance Assessed: Yes Standardized Balance Assessment Standardized Balance Assessment: Timed Up and Go Test;Berg Balance Test Berg Balance Test Sit to Stand: Able to stand without using hands and stabilize independently Standing Unsupported: Able to stand safely 2 minutes Sitting with Back Unsupported but Feet Supported on Floor or Stool: Able to sit safely and securely 2 minutes Stand to Sit: Sits safely with  minimal use of hands Transfers: Able to transfer safely, minor use of hands Standing Unsupported with Eyes Closed: Able to stand 10 seconds safely Standing Ubsupported with Feet Together: Able to place feet together independently and stand for 1 minute with supervision From Standing, Reach Forward with Outstretched Arm: Can reach confidently >25 cm (10") From Standing Position, Pick up Object from Floor: Able to pick up shoe, needs supervision From Standing Position, Turn to Look Behind Over each Shoulder: Looks behind one side only/other side shows less weight shift Turn 360 Degrees: Able to turn 360 degrees safely one side only in 4 seconds or less Standing Unsupported, Alternately Place Feet on Step/Stool: Able to stand independently and safely and complete 8 steps in 20 seconds Standing Unsupported, One Foot in Front: Able to plae foot ahead of the other independently and hold 30 seconds Standing on One Leg: Tries to lift leg/unable to hold 3 seconds but remains standing independently Total Score: 48 Timed Up and Go Test TUG: Normal TUG;Cognitive TUG Normal TUG (seconds): 16 Cognitive TUG (seconds): 19 Extremity Assessment  RUE Assessment RUE Assessment: Exceptions to [facility] (decreased strength and coordination in RUE) LUE Assessment LUE Assessment: Not tested (due to  limited WB status/clavicle fx) RLE Assessment RLE Assessment: Within Functional Limits (grossly 4+ to 5/5 throughout except 4/5 hip flexion) LLE Assessment  LLE Assessment: Within Functional Limits (grossly 4+ to 5/5 throughout except 4/5 hip flexion)  See FIM for current functional status  Laretta Alstrom 07/23/2014, 7:55 AM

## 2014-07-24 ENCOUNTER — Inpatient Hospital Stay (HOSPITAL_COMMUNITY): Payer: 59

## 2014-07-24 ENCOUNTER — Inpatient Hospital Stay (HOSPITAL_COMMUNITY): Payer: 59 | Admitting: Speech Pathology

## 2014-07-24 ENCOUNTER — Inpatient Hospital Stay (HOSPITAL_COMMUNITY): Payer: 59 | Admitting: Rehabilitation

## 2014-07-24 LAB — GLUCOSE, CAPILLARY
Glucose-Capillary: 113 mg/dL — ABNORMAL HIGH (ref 65–99)
Glucose-Capillary: 137 mg/dL — ABNORMAL HIGH (ref 65–99)

## 2014-07-24 MED ORDER — JEVITY 1.2 CAL PO LIQD: 350.0000 mL | Freq: Four times a day (QID) | ORAL | Status: DC

## 2014-07-24 MED ORDER — FREE WATER: 150.0000 mL | Freq: Three times a day (TID) | Status: DC

## 2014-07-24 NOTE — Progress Notes (Signed)
Speech Language Pathology Discharge Summary  Patient Details  Name: Tiffany Stevens MRN: [id number] Date of Birth: 09/10/1955  Today's Date: 07/24/2014 SLP Individual Time: 1030-1130 SLP Individual Time Calculation (min): 60 min   Skilled Therapeutic Interventions: Skilled treatment session focused on addressing dysphagia and communication goals as well as wrap up of education prior to discharge. Upon SLP arrival patient in bathroom and Nurse Tech handed patient off with report of patient not using call bell prior to getting up to go to the bathroom. SLP used that opportunity to educate patient and husband in regards to difference between verbal recall of restrictions and impulsivity in the moment. Husband verbalized understanding and reported that patient would not be left alone following discharge home. SLP facilitated session by providing close supervision during consumption of ice chip trials (~10). Patient demonstrated 3-6 swallows per ice chip with consistent throat clears and intermittent hocking of pharyngeal secretions/residue. Patient recalled and performed pharyngeal strengthening exercises with supervision level verbal cues for accuracy. Patient demonstrated initiation of verbal expression and overall intensity with Min cues to coordinate exhalation with onset of phonation for increased vocal intensity and overall intelligibly. SLP also re-answered questions regarding oral care, NPO recommendations and prognosis of dysphagia recovery. Continue with current plan of care.    Patient has met 5 of 6 long term goals.  Patient to discharge at overall Supervision;Min level.  Reasons goals not met: severity of dysphagia resulted in not initiating a diet prior to dischrage    Clinical Impression/Discharge Summary:  Patient has made functional gains during this rehab admission and has met 5 of 6 long term goals due to improved functional abilities.  Patient is currently an overall Min assist for speech  intelligibility and Supervision assist for basic cognitive tasks and requires Supervision assist for utilization of swallowing exercises.  Patient remains NPO and a follow up objective assessment is not warranted until trials reveal minimal overt s/s of aspiration.  Patient and family education has been completed and patient will discharge home with 24 hour supervision. Patient would benefit from follow up SLP services to continue efforts to maximize swallow function, communication and cognition skills and to maximize her functional independence and further reduce the burden of care.   Care Partner:  Caregiver Able to Provide Assistance: Yes  Type of Caregiver Assistance: Physical;Cognitive  Recommendation:  24 hour supervision/assistance;Outpatient SLP;Home Health SLP  Rationale for SLP Follow Up: Maximize functional communication;Maximize cognitive function and independence;Maximize swallowing safety;Reduce caregiver burden   Equipment: none   Reasons for discharge: Treatment goals met;Discharged from hospital   Patient/Family Agrees with Progress Made and Goals Achieved: Yes   See FIM for current functional status   , M.A., CCC-SLP [phone]  , 07/24/2014, 1:17 PM    

## 2014-07-24 NOTE — Progress Notes (Addendum)
Physical Therapy Session Note  Patient Details  Name: Tiffany Stevens MRN: [id number] Date of Birth: 18-Apr-1955  Today's Date: 07/24/2014 PT Individual Time: 0900-0957 PT Individual Time Calculation (min): 57 min   Short Term Goals: Week 2:  PT Short Term Goal 1 (Week 2): = LTGs of overall supervision  Skilled Therapeutic Interventions/Progress Updates:   Pt received sitting in arm chair in room, agreeable to therapy session.  Husband present asking questions regarding HHPT vs OP PT and which one she would be receiving.  Discussed that if she has follow up nursing care, that she would likely start with HHPT but could progress to OP PT as those services end.  Also discussed wanting early morning sessions, and again discussed that CSW would assist with setting up at least first appt.  Husband verbalized understanding.  Skilled session focused on grad day activities and ensuring pt has met all goals for safe D/C home.  Performed gait to/from restroom without device at S level, performed all toileting at mod I level.  Ambulated to sink to wash/dry hands at S level.  Ambulated throughout session in controlled, busy, and home simulated environment at S level with min cues for posture and scanning environment for obstacles.  Performed 12, 6" steps with single rail at S level.  Pt demonstrated good use of step to pattern for increased safety.  Performed 10MWT with score of 1.35 ft/sec, indicative of risk for recurrent falls.  Performed all functional transfers at S level to furniture, bed, and couch.  Performed bed mobility at mod I level with good use of RUE to push from.  Went over [employer], however pt unable to recall any exercises.  Discussed this with husband to ensure that he felt comfortable performing these with pt.  Performed all x 10 reps.  Performed 4, 6" steps without rails at min A level to better simulate home environment.  Cues for safety and increased upright posture.  Ended session with seated  nustep task x 8 mins at level 4 resistance with BLEs to increase overall activity tolerance and strengthening.  Ambulated back to room and returned to bed for rest break.  Husband states feeling comfortable with HEP and donning/doffing cervical collar.  Left with all needs in reach.   Therapy Documentation Precautions:  Precautions Precautions: Cervical, Fall Precaution Comments: PEG Required Braces or Orthoses: Cervical Brace, Sling Cervical Brace: Hard collar, At all times Restrictions Weight Bearing Restrictions: Yes LUE Weight Bearing: Non weight bearing   Vital Signs: Therapy Vitals Temp: 98.5 F (36.9 C) Temp Source: Oral Pulse Rate: 94 Resp: 18 BP: 125/76 mmHg Patient Position (if appropriate): Lying Oxygen Therapy SpO2: 100 % O2 Device: Not Delivered Pain: Pt with no c/o pain during session, she did state that she took pain medication prior to session.    See FIM for current functional status  Therapy/Group: Individual Therapy  Denice Bors 07/24/2014, 7:57 AM

## 2014-07-24 NOTE — Progress Notes (Signed)
Pt discharged home with husband. Discharge instructions provided by Marissa NestlePam Love, PA. The pt's husband, who is an Charity fundraiserN was educated, and returned demonstrated bolus tube feeding, and water flushes. Husband also observed writer demonstrate dressing change to healing trach site and abdominal peg site. All questions answered. Husband verbalized understanding. Pt escorted off unit in wheelchair with personal belonging [facility].

## 2014-07-24 NOTE — Progress Notes (Signed)
Social Work  Discharge Note  The overall goal for the admission was met for:   Discharge location: Yes - home with husband and family to provide 24/7 supervision/ assist  Length of Stay: Yes - 14 days  Discharge activity level: Yes - supervision  Home/community participation: Yes  Services provided included: MD, RD, PT, OT, SLP, RN, TR, Pharmacy, Neuropsych and SW  Financial Services: Private Insurance: [employer]  Follow-up services arranged: Home Health: RN, PT via [facility], DME: semi-electric hospital bed, 3n1 commode and peg tube feedings via [facility] and Patient/Family has no preference for HH/DME agencies  Comments (or additional information):  Patient/Family verbalized understanding of follow-up arrangements: Yes  Individual responsible for coordination of the follow-up plan: pt/ spouse  Confirmed correct DME delivered: [employer] 07/24/2014    Okechukwu Regnier

## 2014-07-24 NOTE — Progress Notes (Signed)
[place] PHYSICAL MEDICINE & REHABILITATION     PROGRESS NOTE    Subjective/Complaints: Had a very good weekend. Tolerating boluses. Husband nervous about getting all equipment and meds together for discharge. He would like for her to go home today since he's off.  ROS: ROS: Pt denies fever, rash/itching,  blurred or double vision, nausea, vomiting, abdominal pain, diarrhea, chest pain, shortness of breath, palpitations, dysuria, dizziness,  bleeding, anxiety, or depression  Objective: Vital Signs: Blood pressure 125/76, pulse 94, temperature 98.5 F (36.9 C), temperature source Oral, resp. rate 18, weight 49.714 kg (109 lb 9.6 oz), SpO2 100 %. No results found. No results for input(s): WBC, HGB, HCT, PLT in the last 72 hours. No results for input(s): NA, K, CL, GLUCOSE, BUN, CREATININE, CALCIUM in the last 72 hours.  Invalid input(s): CO CBG (last 3)   Recent Labs  07/23/14 1122 07/23/14 1622 07/24/14 0640  GLUCAP 123* 96 113*    Wt Readings from Last 3 Encounters:  07/24/14 49.714 kg (109 lb 9.6 oz)  07/09/14 52.9 kg (116 lb 10 oz)  08/03/07 52.223 kg (115 lb 2.1 oz)    Physical Exam:  Constitutional: She is oriented to person, place, and time. She appears well-developed and well-nourished.     HENT:  Small scab along right nare. Tongue ulcerations much improved. phonation better but still hoarse Eyes: Pupils are equal, round, and reactive to light. No abnl seen in right eye.  Trach site already closed with minimal drainage. Cardiovascular: Regular rhythm.  Rate controlled.     Respiratory: Effort normal. No respiratory distress.  Normal breathing effort. GI: Soft. Bowel sounds are normal. PEG site clean, appropriately tender. No drainage and minimal blood Musculoskeletal: She exhibits tenderness (left shoulder which is improved. ). She exhibits no edema.  Left anterior shoulder tenderness persistent but better Neurological: She is alert and oriented to person,  place, and time. She displays normal reflexes. She exhibits normal muscle tone.  Left shoulder 1+/5 remains limited due to pain/clavicle. biceps 4-/5,   3+/5 triceps, grip 3+/5 Right shoulder 4/5, biceps 45, triceps 2+/5, grip 3/5.  Bilateral LE: HF 4/5, KE 4/5, ADF/APF 4+.  Skin: Skin is warm and dry.  Diffuse ecchymosis left shoulder and bilateral knees are improving.   Psychiatric: Her affect is more appropriate.   Assessment/Plan: 1. 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 Physiatrist is providing close team supervision and 24 hour management of active medical problems listed below. Physiatrist and rehab team continue to assess barriers to discharge/monitor patient progress toward functional and medical goals.   FIM: FIM - Bathing Bathing Steps Patient Completed: Chest, Right Arm, Left Arm, Abdomen, Front perineal area, Right upper leg, Left upper leg, Right lower leg (including foot), Left lower leg (including foot), Buttocks Bathing: 5: Supervision: Safety issues/verbal cues  FIM - Upper Body Dressing/Undressing Upper body dressing/undressing steps patient completed: Thread/unthread left sleeve of pullover shirt/dress, Pull shirt over trunk, Thread/unthread right sleeve of pullover shirt/dresss, Put head through opening of pull over shirt/dress Upper body dressing/undressing: 5: Supervision: Safety issues/verbal cues FIM - Lower Body Dressing/Undressing Lower body dressing/undressing steps patient completed: Thread/unthread left pants leg, Thread/unthread right pants leg, Pull pants up/down, Don/Doff right sock, Don/Doff left sock Lower body dressing/undressing: 5: Supervision: Safety issues/verbal cues  FIM - Toileting Toileting steps completed by patient: Adjust clothing prior to toileting, Adjust clothing after toileting, Performs  perineal  hygiene Toileting Assistive Devices: Grab bar or rail for support Toileting: 5: Supervision: Safety issues/verbal cues  FIM - Diplomatic Services operational officer Devices: Grab bars Toilet Transfers: 5-To toilet/BSC: Supervision (verbal cues/safety issues), 5-From toilet/BSC: Supervision (verbal cues/safety issues)  FIM - Banker Devices: HOB elevated Bed/Chair Transfer: 6: Supine > Sit: No assist, 5: Sit > Supine: Supervision (verbal cues/safety issues), 5: Bed > Chair or W/C: Supervision (verbal cues/safety issues), 5: Chair or W/C > Bed: Supervision (verbal cues/safety issues)  FIM - Locomotion: Wheelchair Locomotion: Wheelchair: 0: Activity did not occur FIM - Locomotion: Ambulation Locomotion: Ambulation Assistive Devices: Other (comment) (none) Ambulation/Gait Assistance: 5: Supervision Locomotion: Ambulation: 5: Travels 150 ft or more with supervision/safety issues  Comprehension Comprehension Mode: Auditory Comprehension: 5-Understands basic 90% of the time/requires cueing < 10% of the time  Expression Expression Mode: Verbal Expression Assistive Devices: 6-Other (Comment) (whispers and writes on Public affairs consultant) Expression: 4-Expresses basic 75 - 89% of the time/requires cueing 10 - 24% of the time. Needs helper to occlude trach/needs to repeat words.  Social Interaction Social Interaction: 5-Interacts appropriately 90% of the time - Needs monitoring or encouragement for participation or interaction.  Problem Solving Problem Solving: 5-Solves complex 90% of the time/cues < 10% of the time  Memory Memory: 4-Recognizes or recalls 75 - 89% of the time/requires cueing 10 - 24% of the time  Medical Problem List and Plan: 1. 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 DVT Prophylaxis/Anticoagulation: Mechanical: Sequential compression devices, below knee Bilateral lower extremities---dopplers negative---pt ambulatory---encouraged OOB 3. Headaches/ Pain Management: Continue Duragesic patch with hydrocodone prn.  -improving pain control in general  -prn hydrocodone   4. Mood: LCSW to follow for support and evaluation as cognition/ability to communicate improves. 5. Neuropsych: This patient is not fully capable of making decisions on her own behalf. 6. Skin/Wound Care: Routine pressure relief measures, oob. PEG site clean  7. Fluids/Electrolytes/Nutrition/Dysphagia:   -bolus feeds via PEG---did well this weekend.  -family education. 8. VDRF:  -stoma closed  -stable respiratory status 9. C2 nondisplaced fracture including type 3 odontoid fracture: Continue Cervical collar for support.  10. ABLA:  hgb 10.7-11.2--  11. Leucocytosis: Monitor for signs of infection.   12. Hyperglycemia:  SSI to help manage BS.  13. Enterobacter PNA: resolved 14. Urinary retention:     -ucx with 100k enterococcus sens to amoxil completed  15. Distal left clavicle fx---continue shoulder sling, pain control. Overall improved. Pt knows limits  -may participate in basic self care tasks with LUE, avoid use of left arm for transfers at this point  -outpt ortho  -begin Codman exercises 16. Fibronecrotic tongue ulcers:  Much improved  -oral rinses to continue.   LOS (Days) 14 A FACE TO FACE EVALUATION WAS PERFORMED  SWARTZ,ZACHARY T 07/24/2014 9:00 AM

## 2014-07-24 NOTE — Discharge Instructions (Signed)
Inpatient Rehab Discharge Instructions  Tiffany Stevens Discharge date and time:  07/24/14  Activities/Precautions/ Functional Status: Activity: activity as tolerated Diet:  Nothing by mouth.  Wound Care: Wash with soap and water. Keep clean and dry. NO ointments, creams or lotions.    Functional status:  ___ No restrictions     ___ Walk up steps independently _X__ 24/7 supervision/assistance   ___ Walk up steps with assistance ___ Intermittent supervision/assistance  ___ Bathe/dress independently ___ Walk with walker     _X__ Bathe/dress with supervision.  ___ Walk Independently    ___ Shower independently ___ Walk with assistance    ___ Shower with assistance _X__ No alcohol     ___ Return to work/school ________    COMMUNITY REFERRALS UPON DISCHARGE:    Home Health:   PT      RN    ST                 Agency:  [facility] Phone: [phone]   Medical Equipment/Items Ordered:  Hospital bed, commode and peg tube feeding supplies                                                      Agency/Supplier:  [facility] @ [phone]     Special Instructions: 1. All medications go thorough the stomach tube. Crush pills, mix them with tube feed and put into the tube.   2. Wash area around tube with soap and water, pat dry and apply dry dressing.  3.  Contact radiology if you notice any redness, swelling, pain, drainage around the tube or develop fever or chills.  4. Wear collar at all times.    My questions have been answered and I understand these instructions. I will adhere to these goals and the provided educational materials after my discharge from the hospital.  Patient/Caregiver Signature _______________________________ Date __________  Clinician Signature _______________________________________ Date __________  Please bring this form and your medication list with you to all your follow-up doctor's appointments.

## 2014-07-24 NOTE — Progress Notes (Signed)
Occupational Therapy Discharge Summary  Patient Details  Name: Tiffany Stevens MRN: [id number] Date of Birth: 04-17-1955  Today's Date: 07/24/2014 OT Individual Time: 0700-0800 OT Individual Time Calculation (min): 60 min    Patient has met 8 of 8 long term goals due to improved activity tolerance, improved balance, postural control, ability to compensate for deficits, functional use of  RIGHT upper extremity, improved attention and improved awareness.  Patient to discharge at overall Supervision level.  Patient's care partner is independent to provide the necessary physical and cognitive assistance at discharge.    Reasons goals not met: N/A, pt met all LTG.   Recommendation:  Patient will benefit from ongoing skilled OT services in home health setting to continue to advance functional skills in the area of BADL, Reduce care partner burden and RUE strength and coordination. .  Equipment: BSC  Reasons for discharge: treatment goals met and discharge from hospital  Patient/family agrees with progress made and goals achieved: Yes  OT Skilled Intervention Pt seen for 1:1 OT session with a focus on ADL retraining, functional mobility, safety awareness, activity tolerance, and patient/family education. Pt received supine in bed with family present and agreeable to participate. Pt agreeable to shower ADL session. Pt completed bed mobility at supervision level and ambulated to toilet to complete toileting routine at supervision. Pt then completed shower transfer to shower seat at supervision. Pt completed bathing sit<>stand via grab bars at supervision with min cues for sequencing bathing steps. Pt required mod cues for utilizing RUE during B/D with pt preference to use LUE. Therapist donned LUE sling and pt returned to supine in bed for cervical collar pad change. Pt completed oral care in standing and continues to demonstrate good dynamic standing balance. Therapist provided HEP list of RUE  strengthening and coordination activities to be completed at home. Pt left seated in chair with husband present and all other needs within reach.   OT Discharge Precautions/Restrictions  Restrictions Weight Bearing Restrictions: Yes LUE Weight Bearing: Non weight bearing General   Vital Signs   Pain Pain Assessment Pain Assessment: No/denies pain ADL   Vision/Perception  Vision- History Baseline Vision/History: Wears glasses Wears Glasses: Reading only Vision- Assessment Eye Alignment: Within Functional Limits Tracking/Visual Pursuits: Able to track stimulus in all quads without difficulty Saccades: Within functional limits Convergence: Within functional limits Visual Fields: No apparent deficits  Cognition Overall Cognitive Status: Impaired/Different from baseline Arousal/Alertness: Awake/alert Orientation Level: Oriented X4 Attention: Selective Sustained Attention: Appears intact Awareness: Impaired Awareness Impairment: Emergent impairment Executive Function: Sequencing Behaviors: Impulsive Safety/Judgment: Impaired [employer] Scales of Cognitive Functioning: Automatic/appropriate Sensation Sensation Light Touch: Impaired Detail Light Touch Impaired Details: Impaired RUE;Impaired RLE Hot/Cold: Appears Intact Proprioception: Appears Intact Coordination Gross Motor Movements are Fluid and Coordinated: No Fine Motor Movements are Fluid and Coordinated: No Coordination and Movement Description: limited by precautions  Motor  Motor Motor: Abnormal postural alignment and control Motor - Discharge Observations: lateral neck flexion to R side; can correct with verbal cues for posture  Mobility  Bed Mobility Bed Mobility: Sit to Supine;Supine to Sit Supine to Sit: 5: Supervision;HOB flat Supine to Sit Details: Verbal cues for technique Sit to Supine: 5: Supervision;HOB flat Transfers Transfers: Sit to Stand;Stand to Sit Sit to Stand: 6: Modified  independent (Device/Increase time);Without upper extremity assist Sit to Stand Details: Verbal cues for technique;Verbal cues for precautions/safety;Verbal cues for sequencing Stand to Sit: 6: Modified independent (Device/Increase time);Without upper extremity assist Stand to Sit Details (indicate cue type  and reason): Verbal cues for sequencing;Verbal cues for technique;Verbal cues for precautions/safety  Trunk/Postural Assessment  Cervical Assessment Cervical Assessment: Exceptions to [facility] (lateral neck flexion) Thoracic Assessment Thoracic Assessment: Within Functional Limits Lumbar Assessment Lumbar Assessment: Within Functional Limits Postural Control Postural Control: Within Functional Limits  Balance Balance Balance Assessed: Yes Static Sitting Balance Static Sitting - Comment/# of Minutes: static sitting at mod I level Dynamic Sitting Balance Sitting balance - Comments: dynamic sitting at mod I  Static Standing Balance Static Standing - Comment/# of Minutes: supervision Dynamic Standing Balance Dynamic Standing - Comments: supervision  Extremity/Trunk Assessment RUE Assessment RUE Assessment: Exceptions to [facility] (decreased strength and coordination in RUE) LUE Assessment LUE Assessment: Within Functional Limits  See FIM for current functional status  Raquel Sarna Mayur Duman 07/24/2014, 12:12 PM
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
## 2014-08-01 ENCOUNTER — Telehealth: Payer: Self-pay | Admitting: *Deleted

## 2014-08-01 NOTE — Telephone Encounter (Signed)
Tiffany Stevens PT is calling to request addition of OT due to some issues with hand.  Approval given.

## 2014-08-02 ENCOUNTER — Telehealth: Payer: Self-pay | Admitting: *Deleted

## 2014-08-02 NOTE — Telephone Encounter (Signed)
Tiffany Stevens called with questions about rehab after [name] therapies completed .  He also called back this morning stating that she was having abdominal pain.  I called to speak with him and he was [name].  I told him it would be easiest to call [name] and have her paged. He will do that.  I also explained that typically when she is ready to be discharged by home health therapist they call and request for the order to be place for outpt therapy.

## 2014-08-08 ENCOUNTER — Telehealth: Payer: Self-pay | Admitting: *Deleted

## 2014-08-08 DIAGNOSIS — S069X4S Unspecified intracranial injury with loss of consciousness 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, sequela: Secondary | ICD-10-CM

## 2014-08-08 DIAGNOSIS — S42002S Fracture of unspecified part of left clavicle, sequela: Secondary | ICD-10-CM

## 2014-08-08 NOTE — Telephone Encounter (Signed)
Tiffany DerryAllison Stevens STR @[facility]  called asking for us to setup a referral for outpatient OT/PT/ST starting next week July 25th.  Pt is no longer homebound and is needing ST to address voicing and swallowing issues, OT to address right hand weakness, and PT to address right shoulder ROM.Marland Kitchen.Marland Kitchen.Marland Kitchen.Marland Kitchen.please advise

## 2014-08-08 NOTE — Telephone Encounter (Signed)
Please find out where pt/husband would like to go therapy. i would prefer neuro rehab here in [place] if at all possible.

## 2014-08-09 DIAGNOSIS — S42002A Fracture of unspecified part of left clavicle, initial encounter for closed fracture: Secondary | ICD-10-CM | POA: Insufficient documentation

## 2014-08-09 NOTE — Telephone Encounter (Signed)
See note from [facility]

## 2014-08-09 NOTE — Telephone Encounter (Signed)
Referrals made to [facility]

## 2014-08-11 ENCOUNTER — Telehealth: Payer: Self-pay | Admitting: *Deleted

## 2014-08-11 ENCOUNTER — Other Ambulatory Visit (HOSPITAL_COMMUNITY): Payer: Self-pay | Admitting: Physical Medicine & Rehabilitation

## 2014-08-11 DIAGNOSIS — S0219XD Other fracture of base of skull, subsequent encounter for fracture with routine healing: Secondary | ICD-10-CM | POA: Diagnosis not present

## 2014-08-11 DIAGNOSIS — S12100D Unspecified displaced fracture of second cervical vertebra, subsequent encounter for fracture with routine healing: Secondary | ICD-10-CM | POA: Diagnosis not present

## 2014-08-11 DIAGNOSIS — R4189 Other symptoms and signs involving cognitive functions and awareness: Secondary | ICD-10-CM | POA: Diagnosis not present

## 2014-08-11 DIAGNOSIS — S02110D Type I occipital condyle fracture, subsequent encounter for fracture with routine healing: Secondary | ICD-10-CM | POA: Diagnosis not present

## 2014-08-11 DIAGNOSIS — R1314 Dysphagia, pharyngoesophageal phase: Secondary | ICD-10-CM

## 2014-08-11 NOTE — Telephone Encounter (Signed)
Order written

## 2014-08-11 NOTE — Telephone Encounter (Signed)
Tiffany Stevens, ST  would like pt scheduled for a modified barium swallow @ [place] or [facility].  The patient had trials yesterday morning with oral intake and she feels that the patient is ready to start eating by mouth

## 2014-08-15 ENCOUNTER — Ambulatory Visit (HOSPITAL_COMMUNITY)
Admission: RE | Admit: 2014-08-15 | Discharge: 2014-08-15 | Disposition: A | Discharge status: Home / Self Care | Payer: 59 | Source: Ambulatory Visit | Attending: Physical Medicine & Rehabilitation | Admitting: Physical Medicine & Rehabilitation

## 2014-08-15 DIAGNOSIS — R1314 Dysphagia, pharyngoesophageal phase: Secondary | ICD-10-CM | POA: Diagnosis not present

## 2014-08-16 ENCOUNTER — Ambulatory Visit: Payer: 59 | Attending: Registered Nurse | Admitting: Physical Therapy

## 2014-08-16 ENCOUNTER — Ambulatory Visit: Payer: 59

## 2014-08-16 ENCOUNTER — Encounter: Payer: Self-pay | Admitting: Physical Therapy

## 2014-08-16 ENCOUNTER — Ambulatory Visit: Payer: 59 | Admitting: Occupational Therapy

## 2014-08-16 ENCOUNTER — Encounter: Payer: Self-pay | Admitting: Occupational Therapy

## 2014-08-16 DIAGNOSIS — R29898 Other symptoms and signs involving the musculoskeletal system: Secondary | ICD-10-CM | POA: Insufficient documentation

## 2014-08-16 DIAGNOSIS — H539 Unspecified visual disturbance: Secondary | ICD-10-CM | POA: Insufficient documentation

## 2014-08-16 DIAGNOSIS — M542 Cervicalgia: Secondary | ICD-10-CM | POA: Diagnosis not present

## 2014-08-16 DIAGNOSIS — R1312 Dysphagia, oropharyngeal phase: Secondary | ICD-10-CM

## 2014-08-16 DIAGNOSIS — R52 Pain, unspecified: Secondary | ICD-10-CM

## 2014-08-16 DIAGNOSIS — M6289 Other specified disorders of muscle: Secondary | ICD-10-CM | POA: Diagnosis present

## 2014-08-16 DIAGNOSIS — R269 Unspecified abnormalities of gait and mobility: Secondary | ICD-10-CM | POA: Insufficient documentation

## 2014-08-16 DIAGNOSIS — M25612 Stiffness of left shoulder, not elsewhere classified: Secondary | ICD-10-CM | POA: Insufficient documentation

## 2014-08-16 DIAGNOSIS — R293 Abnormal posture: Secondary | ICD-10-CM | POA: Diagnosis present

## 2014-08-16 DIAGNOSIS — R2681 Unsteadiness on feet: Secondary | ICD-10-CM | POA: Insufficient documentation
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
## 2014-08-16 NOTE — Patient Instructions (Addendum)
   Swallowing precautions:  Eat slowly  Small bites and sips  Swallow twice for each bite or sip  Clear throat and swallow again   COLD, SOUR DRINKS ARE BEST!!!
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
## 2014-08-21 ENCOUNTER — Ambulatory Visit: Payer: 59 | Admitting: Occupational Therapy

## 2014-08-21 ENCOUNTER — Ambulatory Visit: Payer: 59 | Attending: Registered Nurse | Admitting: Physical Therapy

## 2014-08-21 ENCOUNTER — Encounter: Payer: Self-pay | Admitting: Physical Therapy

## 2014-08-21 DIAGNOSIS — R52 Pain, unspecified: Secondary | ICD-10-CM | POA: Diagnosis present

## 2014-08-21 DIAGNOSIS — M6289 Other specified disorders of muscle: Secondary | ICD-10-CM | POA: Insufficient documentation

## 2014-08-21 DIAGNOSIS — R2681 Unsteadiness on feet: Secondary | ICD-10-CM | POA: Diagnosis present

## 2014-08-21 DIAGNOSIS — M25612 Stiffness of left shoulder, not elsewhere classified: Secondary | ICD-10-CM

## 2014-08-21 DIAGNOSIS — R269 Unspecified abnormalities of gait and mobility: Secondary | ICD-10-CM | POA: Diagnosis not present

## 2014-08-21 DIAGNOSIS — R1312 Dysphagia, oropharyngeal phase: Secondary | ICD-10-CM | POA: Diagnosis present

## 2014-08-21 DIAGNOSIS — R293 Abnormal posture: Secondary | ICD-10-CM | POA: Diagnosis present

## 2014-08-21 DIAGNOSIS — R1314 Dysphagia, pharyngoesophageal phase: Secondary | ICD-10-CM | POA: Diagnosis present

## 2014-08-21 DIAGNOSIS — R29898 Other symptoms and signs involving the musculoskeletal system: Secondary | ICD-10-CM | POA: Diagnosis present

## 2014-08-21 DIAGNOSIS — M542 Cervicalgia: Secondary | ICD-10-CM | POA: Diagnosis present

## 2014-08-21 NOTE — Patient Instructions (Addendum)
Neck: Retraction   Sit with back and head straight. Place towel behind your head. Do not turn or tilt head. Press back of head into towel. Hold __5__ seconds. Repeat __5-10__ times. Do __3__ sessions per day. CAUTION: Movement should be gentle, steady and slow.  Copyright  VHI. All rights reserved.  Neck Retraction: Side-Bend   Sitting or standing, tuck chin and side-bend head toward left shoulder. Hold each way 5 seconds Repeat _5-10___ times per set. Do _3___ sessions per day.  http://orth.exer.us/387   Copyright  VHI. All rights reserved.  Upper Cervical Rotation   Rotate head slowly from side to side as if saying "no". Do not turn head completely to either side. Keep motion small. Repeat __5-10__ times per set.  Do __3__ sessions per day.  http://orth.exer.us/375   Copyright  VHI. All rights reserved.  Upper Cervical Rotation   http://orth.exer.us/375   Copyright  VHI. All rights reserved.  Neck Flexion / Extension - Arms at Sides   Standing facing forward, arms at sides, flex then extend neck, moving head forward then back. Hold __5__ seconds. Do _5-10___ repetitions, 3 times per day.   http://bt.exer.us/243   Copyright  VHI. All rights reserved.  Upper Limb Neural Tension: Radial II   Do this in sitting. Grasp stationary object with left hand and bend head away, keeping shoulder girdle down. Reach opposite hand that is holding towards floor. Hold _5___ seconds. Repeat __5-10__ times per set.. Do 3____ sessions per day.  http://orth.exer.us/410   Copyright  VHI. All rights reserved.  TRUNK: Rotation   Sit with upright posture. Rotate body until gentle resistance is felt. Hold __5_ seconds. Do 5-10 reps  Copyright  VHI. All rights reserved.
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
## 2014-08-21 NOTE — Patient Instructions (Signed)
1. Grip Strengthening (Resistive Putty)   Squeeze putty using Rt thumb and all fingers. Repeat _20___ times. Do __2__ sessions per day.   2. Roll putty into tube on table and pinch between each finger and thumb, Rt hand x 10 reps each. (can do ring and small finger together)   3. ROM - SHOULDER FLEXION    Lie on back or sitting up, holding wand. Raise arms over head. Hold 5sec. Repeat 10 times per set.  Do 2-3 sessions per day.   ROM: Abduction - Wand   Holding wand with left hand palm up, push wand directly out to side, leading with other hand palm down, until stretch is felt. Hold 5 seconds. Repeat 10 times per set. Do 2-3 sessions per day.    ROM: Extension - Wand (Standing)   Stand holding wand behind back. Raise arms as far as possible. Repeat 10 times per set.  Do 2-3 sessions per day.

## 2014-08-22 ENCOUNTER — Ambulatory Visit: Payer: 59 | Admitting: Speech Pathology

## 2014-08-22 ENCOUNTER — Encounter: Payer: Self-pay | Admitting: Physical Medicine & Rehabilitation

## 2014-08-22 ENCOUNTER — Encounter: Payer: 59 | Attending: Physical Medicine & Rehabilitation | Admitting: Physical Medicine & Rehabilitation

## 2014-08-22 VITALS — BP 105/62 | HR 86 | Resp 14

## 2014-08-22 DIAGNOSIS — S04011A Injury of optic nerve, right eye, initial encounter: Secondary | ICD-10-CM | POA: Diagnosis not present

## 2014-08-22 DIAGNOSIS — F39 Unspecified mood [affective] disorder: Secondary | ICD-10-CM | POA: Insufficient documentation

## 2014-08-22 DIAGNOSIS — X58XXXA Exposure to other specified factors, initial encounter: Secondary | ICD-10-CM | POA: Diagnosis not present

## 2014-08-22 DIAGNOSIS — S069X4S Unspecified intracranial injury with loss of consciousness 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, sequela: Secondary | ICD-10-CM

## 2014-08-22 DIAGNOSIS — S0292XS Unspecified fracture of facial bones, sequela: Secondary | ICD-10-CM

## 2014-08-22 DIAGNOSIS — S42002A Fracture of unspecified part of left clavicle, initial encounter for closed fracture: Secondary | ICD-10-CM | POA: Diagnosis not present

## 2014-08-22 DIAGNOSIS — R269 Unspecified abnormalities of gait and mobility: Secondary | ICD-10-CM | POA: Diagnosis not present

## 2014-08-22 DIAGNOSIS — S12100S Unspecified displaced fracture of second cervical vertebra, sequela: Secondary | ICD-10-CM

## 2014-08-22 DIAGNOSIS — S2242XS Multiple fractures of ribs, left side, sequela: Secondary | ICD-10-CM

## 2014-08-22 DIAGNOSIS — Z87820 Personal history of traumatic brain injury: Secondary | ICD-10-CM | POA: Diagnosis not present

## 2014-08-22 DIAGNOSIS — R4189 Other symptoms and signs involving cognitive functions and awareness: Secondary | ICD-10-CM | POA: Insufficient documentation

## 2014-08-22 DIAGNOSIS — S12600S Unspecified displaced fracture of seventh cervical vertebra, sequela: Secondary | ICD-10-CM

## 2014-08-22 DIAGNOSIS — R1310 Dysphagia, unspecified: Secondary | ICD-10-CM | POA: Diagnosis not present

## 2014-08-22 DIAGNOSIS — R51 Headache: Secondary | ICD-10-CM | POA: Diagnosis not present

## 2014-08-22 DIAGNOSIS — S42002S Fracture of unspecified part of left clavicle, sequela: Secondary | ICD-10-CM

## 2014-08-22 DIAGNOSIS — R1312 Dysphagia, oropharyngeal phase: Secondary | ICD-10-CM

## 2014-08-22 MED ORDER — FENTANYL 12 MCG/HR TD PT72: 12.5000 ug | MEDICATED_PATCH | TRANSDERMAL | Status: DC

## 2014-08-22 MED ORDER — TRAMADOL HCL 50 MG PO TABS: 50.0000 mg | ORAL_TABLET | Freq: Four times a day (QID) | ORAL | Status: DC | PRN

## 2014-08-22 MED ORDER — ALPRAZOLAM 0.25 MG PO TABS: ORAL_TABLET | ORAL | Status: DC
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
## 2014-08-22 NOTE — Patient Instructions (Signed)
  SWALLOWING EXERCISES 1. Effortful Swallows - Squeeze hard with the muscles in your neck while you swallow your  saliva or a sip of water - Repeat 20 times, 2-3 times a day, and use whenever you eat or drink  2. Masako Swallow - swallow with your tongue sticking out - Stick tongue out and gently bite tongue with your teeth - Swallow, while holding your tongue with your teeth - Repeat 20 times, 2-3 times a day  3. Pitch Raise - Repeat "he", once per second in as high of a pitch as you can - Repeat 20 times, 2-3 times a day  4. Shaker Exercise - head lift - Lie flat on your back in your bed or on a couch without pillows - Raise your head and look at your feet  - KEEP YOUR SHOULDERS DOWN - HOLD FOR 45 SECONDS, then lower your head back down - Repeat 3 times, 2-3 times a day  5. Mendelsohn Maneuver - "half swallow" exercise - Start to swallow, and keep your Adam's apple up by squeezing hard with the muscles of the throat - Hold the squeeze for 5-7 seconds and then relax - Repeat 20 times, 2-3 times a day  6. Tongue Press - Press your entire tongue as hard as you can against the roof of your mouth for 3-5 seconds - Repeat 20 times, 2-3 times a day  7. Tongue Stretch/Teeth Clean - Move your tongue around the pocket between your gums and teeth, clockwise and then counter-clockwise - Repeat on the back side, clockwise and then counter-clockwise - Repeat 15-20 times, 2-3 times a day  8. Breath Hold - Say "HUH!" loudly, holding your breath tightly at the level of your voice box for 3 seconds - Repeat 20 times, 2-3 times a day  9. Chin pushback - Open your mouth  - Place your fist UNDER your chin near your neck, and push back with your fist for 5 seconds  - Repeat 20 times, 2-3 times a day     10. Open mouth swallow          -Swallow with your mouth open wide          -Repeat 15 times, 2 times a day  11. Stick out your tongue and say "GA-GA-GA" loud and shart  -Repeat 25 sets of 3, 2-3 times a day  12. Say ING-GA loud and exaggerated             - 25 times 2-3 times a day  13. Gargle or pretend to gargle             - 10 times for 3-5 seconds (or as long as you can) 2 times a day     

## 2014-08-22 NOTE — Patient Instructions (Signed)
CONTINUE TO WORK ON SWALLOWING USING THE TECHNIQUES THE SPEECH THERAPIST HAS SHOWN YOU.    TAKE YOUR FENTANYL PATCH ( ) UNTIL BOX IS GONE THEN STOP.   YOU MAY SLEEP ON YOUR SIDE AT NIGHT.
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
## 2014-08-23 ENCOUNTER — Ambulatory Visit: Payer: 59

## 2014-08-23 ENCOUNTER — Ambulatory Visit: Payer: 59 | Admitting: Occupational Therapy

## 2014-08-23 ENCOUNTER — Ambulatory Visit: Payer: 59 | Admitting: Physical Therapy

## 2014-08-23 ENCOUNTER — Encounter: Payer: Self-pay | Admitting: Physical Therapy

## 2014-08-23 DIAGNOSIS — M25612 Stiffness of left shoulder, not elsewhere classified: Secondary | ICD-10-CM

## 2014-08-23 DIAGNOSIS — R269 Unspecified abnormalities of gait and mobility: Secondary | ICD-10-CM

## 2014-08-23 DIAGNOSIS — R2681 Unsteadiness on feet: Secondary | ICD-10-CM

## 2014-08-23 DIAGNOSIS — M542 Cervicalgia: Secondary | ICD-10-CM

## 2014-08-23 DIAGNOSIS — R293 Abnormal posture: Secondary | ICD-10-CM

## 2014-08-23 DIAGNOSIS — R29898 Other symptoms and signs involving the musculoskeletal system: Secondary | ICD-10-CM
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
## 2014-08-24 ENCOUNTER — Encounter: Payer: 59 | Admitting: Occupational Therapy

## 2014-08-28 ENCOUNTER — Ambulatory Visit: Payer: 59 | Admitting: Occupational Therapy

## 2014-08-28 DIAGNOSIS — R52 Pain, unspecified: Secondary | ICD-10-CM

## 2014-08-28 DIAGNOSIS — R269 Unspecified abnormalities of gait and mobility: Secondary | ICD-10-CM | POA: Diagnosis not present

## 2014-08-28 DIAGNOSIS — R29898 Other symptoms and signs involving the musculoskeletal system: Secondary | ICD-10-CM

## 2014-08-28 DIAGNOSIS — M25612 Stiffness of left shoulder, not elsewhere classified: Secondary | ICD-10-CM
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
## 2014-08-30 ENCOUNTER — Telehealth: Payer: Self-pay | Admitting: Physical Medicine & Rehabilitation

## 2014-08-30 ENCOUNTER — Encounter: Payer: Self-pay | Admitting: Physical Therapy

## 2014-08-30 ENCOUNTER — Encounter: Payer: Self-pay | Admitting: Occupational Therapy

## 2014-08-30 ENCOUNTER — Ambulatory Visit: Payer: 59

## 2014-08-30 ENCOUNTER — Ambulatory Visit: Payer: 59 | Admitting: Occupational Therapy

## 2014-08-30 ENCOUNTER — Ambulatory Visit: Payer: 59 | Admitting: Physical Therapy

## 2014-08-30 DIAGNOSIS — R269 Unspecified abnormalities of gait and mobility: Secondary | ICD-10-CM | POA: Diagnosis not present

## 2014-08-30 DIAGNOSIS — R1312 Dysphagia, oropharyngeal phase: Secondary | ICD-10-CM

## 2014-08-30 DIAGNOSIS — R52 Pain, unspecified: Secondary | ICD-10-CM

## 2014-08-30 DIAGNOSIS — M25612 Stiffness of left shoulder, not elsewhere classified: Secondary | ICD-10-CM

## 2014-08-30 DIAGNOSIS — M542 Cervicalgia: Secondary | ICD-10-CM

## 2014-08-30 DIAGNOSIS — R29898 Other symptoms and signs involving the musculoskeletal system: Secondary | ICD-10-CM

## 2014-08-30 DIAGNOSIS — R293 Abnormal posture: Secondary | ICD-10-CM

## 2014-08-30 NOTE — Telephone Encounter (Signed)
Patients husband Tiffany Stevens needs a call back, has a question about her Peg Tube--please call at [phone]
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
## 2014-08-30 NOTE — Patient Instructions (Addendum)
Signs of Aspiration Pneumonia    . Chest pain/tightness . Fever (can be low grade) . Cough  o With foul-smelling phlegm (sputum) o With sputum containing pus or blood o With greenish sputum . Fatigue  . Shortness of breath  . Wheezing   **IF YOU HAVE THESE SIGNS, CONTACT YOUR DOCTOR OR GO TO THE EMERGENCY DEPARTMENT OR URGENT CARE AS SOON AS POSSIBLE**     ====================================================================  When you eat:  Alternate between bites and sips  Swallow 2 to 3 times for every bite or sip  Clear your throat every 3-4 minutes  Small bites and sips

## 2014-08-30 NOTE — Telephone Encounter (Signed)
Pam can you check on this for me. Please and thank you.
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
## 2014-08-30 NOTE — Telephone Encounter (Signed)
Did not get an answer at home. Left message for husband to call the rehab floor if he has questions.
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
## 2014-08-31 ENCOUNTER — Ambulatory Visit (HOSPITAL_COMMUNITY)
Admission: RE | Admit: 2014-08-31 | Discharge: 2014-08-31 | Disposition: A | Discharge status: Home / Self Care | Payer: 59 | Source: Ambulatory Visit | Attending: Interventional Radiology | Admitting: Interventional Radiology

## 2014-08-31 ENCOUNTER — Other Ambulatory Visit (HOSPITAL_COMMUNITY): Payer: Self-pay | Admitting: Interventional Radiology

## 2014-08-31 ENCOUNTER — Ambulatory Visit: Payer: 59 | Admitting: Speech Pathology

## 2014-08-31 ENCOUNTER — Encounter: Payer: Self-pay | Admitting: Physical Therapy

## 2014-08-31 ENCOUNTER — Ambulatory Visit: Payer: 59 | Admitting: Physical Therapy

## 2014-08-31 DIAGNOSIS — R6251 Failure to thrive (child): Secondary | ICD-10-CM

## 2014-08-31 DIAGNOSIS — M542 Cervicalgia: Secondary | ICD-10-CM

## 2014-08-31 DIAGNOSIS — R269 Unspecified abnormalities of gait and mobility: Secondary | ICD-10-CM | POA: Diagnosis not present

## 2014-08-31 DIAGNOSIS — Z431 Encounter for attention to gastrostomy: Secondary | ICD-10-CM | POA: Insufficient documentation

## 2014-08-31 DIAGNOSIS — R1312 Dysphagia, oropharyngeal phase: Secondary | ICD-10-CM

## 2014-08-31 DIAGNOSIS — R293 Abnormal posture: Secondary | ICD-10-CM

## 2014-08-31 DIAGNOSIS — R52 Pain, unspecified: Secondary | ICD-10-CM

## 2014-08-31 MED ORDER — IOHEXOL 300 MG/ML  SOLN
50.0000 mL | Freq: Once | INTRAMUSCULAR | Status: DC | PRN
  Administered 2014-08-31: 10 mL
  Filled 2014-08-31: qty 50
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
## 2014-08-31 NOTE — Patient Instructions (Signed)
Continue [name] at home Dys 3 and nectar thick liquids  Continue dysphagia exercises and consider meds [name]

## 2014-08-31 NOTE — Procedures (Signed)
Gastrostomy check It is patent without complication. No comp/EBL

## 2014-09-04 ENCOUNTER — Encounter: Payer: Self-pay | Admitting: Physical Therapy

## 2014-09-04 ENCOUNTER — Ambulatory Visit: Payer: 59 | Admitting: Physical Therapy

## 2014-09-04 ENCOUNTER — Telehealth: Payer: Self-pay | Admitting: Physical Medicine & Rehabilitation

## 2014-09-04 DIAGNOSIS — M542 Cervicalgia: Secondary | ICD-10-CM

## 2014-09-04 DIAGNOSIS — R269 Unspecified abnormalities of gait and mobility: Secondary | ICD-10-CM | POA: Diagnosis not present

## 2014-09-04 DIAGNOSIS — R52 Pain, unspecified: Secondary | ICD-10-CM

## 2014-09-04 DIAGNOSIS — R2681 Unsteadiness on feet: Secondary | ICD-10-CM

## 2014-09-04 DIAGNOSIS — R293 Abnormal posture: Secondary | ICD-10-CM

## 2014-09-04 MED ORDER — METHOCARBAMOL 500 MG PO TABS: 500.0000 mg | ORAL_TABLET | Freq: Four times a day (QID) | ORAL | Status: DC | PRN

## 2014-09-04 NOTE — Telephone Encounter (Signed)
DO you want to proceed with ordering a muscle relaxer?

## 2014-09-04 NOTE — Telephone Encounter (Signed)
Patient just got done with PT and the therapist suggested that she get a prescription for a mild muscle relaxer for spasms.  Please call husband Ethelene Browns at [phone].

## 2014-09-04 NOTE — Telephone Encounter (Signed)
rx'ed robaxin--sent to [facility] pharmacy
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
## 2014-09-05 ENCOUNTER — Ambulatory Visit: Payer: 59 | Admitting: Occupational Therapy

## 2014-09-05 DIAGNOSIS — R52 Pain, unspecified: Secondary | ICD-10-CM

## 2014-09-05 DIAGNOSIS — R29898 Other symptoms and signs involving the musculoskeletal system: Secondary | ICD-10-CM

## 2014-09-05 DIAGNOSIS — R269 Unspecified abnormalities of gait and mobility: Secondary | ICD-10-CM | POA: Diagnosis not present

## 2014-09-05 DIAGNOSIS — M25612 Stiffness of left shoulder, not elsewhere classified: Secondary | ICD-10-CM
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
## 2014-09-06 ENCOUNTER — Ambulatory Visit: Payer: 59

## 2014-09-06 DIAGNOSIS — R269 Unspecified abnormalities of gait and mobility: Secondary | ICD-10-CM | POA: Diagnosis not present

## 2014-09-06 DIAGNOSIS — R1312 Dysphagia, oropharyngeal phase: Secondary | ICD-10-CM

## 2014-09-06 NOTE — Telephone Encounter (Signed)
Notified Tiffany Stevens medication is at the pharmacy

## 2014-09-06 NOTE — Patient Instructions (Signed)
Increase your food to eat and decrease the amount of calories you get from the tube feeding.   You are safe to eat when you follow those instructions we have reviewed each time.  You are also safe to take your medicines whole, in something like applesauce, yogurt, or pudding.
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
## 2014-09-07 ENCOUNTER — Ambulatory Visit: Payer: 59 | Admitting: Occupational Therapy

## 2014-09-08 ENCOUNTER — Ambulatory Visit: Payer: 59 | Admitting: Physical Therapy

## 2014-09-08 ENCOUNTER — Ambulatory Visit: Payer: 59

## 2014-09-08 ENCOUNTER — Ambulatory Visit: Payer: 59 | Admitting: Occupational Therapy

## 2014-09-08 ENCOUNTER — Encounter: Payer: Self-pay | Admitting: Physical Therapy

## 2014-09-08 DIAGNOSIS — R1312 Dysphagia, oropharyngeal phase: Secondary | ICD-10-CM

## 2014-09-08 DIAGNOSIS — M542 Cervicalgia: Secondary | ICD-10-CM

## 2014-09-08 DIAGNOSIS — R52 Pain, unspecified: Secondary | ICD-10-CM

## 2014-09-08 DIAGNOSIS — R29898 Other symptoms and signs involving the musculoskeletal system: Secondary | ICD-10-CM

## 2014-09-08 DIAGNOSIS — R269 Unspecified abnormalities of gait and mobility: Secondary | ICD-10-CM | POA: Diagnosis not present

## 2014-09-08 DIAGNOSIS — M25612 Stiffness of left shoulder, not elsewhere classified: Secondary | ICD-10-CM

## 2014-09-08 DIAGNOSIS — R293 Abnormal posture: Secondary | ICD-10-CM

## 2014-09-08 NOTE — Patient Instructions (Signed)
Check with Dr. Riley Kill about cutting the tube feed in half then increase by mouth food  Do 4-8 oz. water after each meal after you brush teeth and use mouthwash  Take pills with applesauce or yogurt
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
## 2014-09-13 ENCOUNTER — Encounter: Payer: Self-pay | Admitting: Physical Therapy

## 2014-09-13 ENCOUNTER — Ambulatory Visit: Payer: 59

## 2014-09-13 ENCOUNTER — Encounter: Payer: Self-pay | Admitting: Occupational Therapy

## 2014-09-13 ENCOUNTER — Ambulatory Visit: Payer: 59 | Admitting: Physical Therapy

## 2014-09-13 ENCOUNTER — Ambulatory Visit: Payer: 59 | Admitting: Occupational Therapy

## 2014-09-13 DIAGNOSIS — R2681 Unsteadiness on feet: Secondary | ICD-10-CM

## 2014-09-13 DIAGNOSIS — M25612 Stiffness of left shoulder, not elsewhere classified: Secondary | ICD-10-CM

## 2014-09-13 DIAGNOSIS — R293 Abnormal posture: Secondary | ICD-10-CM

## 2014-09-13 DIAGNOSIS — R1314 Dysphagia, pharyngoesophageal phase: Secondary | ICD-10-CM

## 2014-09-13 DIAGNOSIS — M542 Cervicalgia: Secondary | ICD-10-CM

## 2014-09-13 DIAGNOSIS — R269 Unspecified abnormalities of gait and mobility: Secondary | ICD-10-CM | POA: Diagnosis not present

## 2014-09-13 DIAGNOSIS — R29898 Other symptoms and signs involving the musculoskeletal system: Secondary | ICD-10-CM
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
## 2014-09-13 NOTE — Patient Instructions (Signed)
We will decrease to once a week due to you doing so well.  I sent a note to Dr. Riley Kill asking him to consider a follow up swallow test.
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
## 2014-09-15 ENCOUNTER — Telehealth: Payer: Self-pay | Admitting: Physical Medicine & Rehabilitation

## 2014-09-15 ENCOUNTER — Ambulatory Visit: Payer: 59 | Admitting: Physical Therapy

## 2014-09-15 ENCOUNTER — Ambulatory Visit: Payer: 59

## 2014-09-15 ENCOUNTER — Ambulatory Visit: Payer: 59 | Admitting: Occupational Therapy

## 2014-09-15 ENCOUNTER — Encounter: Payer: Self-pay | Admitting: Physical Therapy

## 2014-09-15 DIAGNOSIS — M542 Cervicalgia: Secondary | ICD-10-CM

## 2014-09-15 DIAGNOSIS — R269 Unspecified abnormalities of gait and mobility: Secondary | ICD-10-CM | POA: Diagnosis not present

## 2014-09-15 DIAGNOSIS — R29898 Other symptoms and signs involving the musculoskeletal system: Secondary | ICD-10-CM

## 2014-09-15 DIAGNOSIS — R1310 Dysphagia, unspecified: Secondary | ICD-10-CM

## 2014-09-15 DIAGNOSIS — R1314 Dysphagia, pharyngoesophageal phase: Secondary | ICD-10-CM

## 2014-09-15 DIAGNOSIS — R52 Pain, unspecified: Secondary | ICD-10-CM

## 2014-09-15 DIAGNOSIS — M25612 Stiffness of left shoulder, not elsewhere classified: Secondary | ICD-10-CM

## 2014-09-15 DIAGNOSIS — R293 Abnormal posture: Secondary | ICD-10-CM

## 2014-09-15 DIAGNOSIS — R2681 Unsteadiness on feet: Secondary | ICD-10-CM

## 2014-09-15 NOTE — Telephone Encounter (Signed)
Barbara Cower- please call patient's husband and let him know the order Dr. Riley Kill has placed and facilitate the order. Please and thank you.
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
## 2014-09-15 NOTE — Therapy (Signed)
[facility] [address] [place], [place], [address] Phone: [phone]   Fax:  [phone]  Patient Details  Name: Tiffany Stevens MRN: [id number] Date of Birth: 1955-06-18 Referring Provider:  Fatima Sanger, FNP  Encounter Date: 09/15/2014 Pt continues to decr tube feed (2 cans/day) and incr POs. SLP encouraged pt to decr to 1 can/day next week and cont increasing POs.  HEP still completed as directed.  [facility] , MS, CCC-SLP  09/15/2014, 12:16 PM  [facility] [address] [place], [place], [address] Phone: [phone]   Fax:  [phone]

## 2014-09-15 NOTE — Telephone Encounter (Signed)
Tiffany Stevens needs a return call about his wife.  ST has recommended her to have another modified barium swallow test done and he would like to know when this is going to be scheduled so he can put in his schedule.  His number is [phone].

## 2014-09-15 NOTE — Telephone Encounter (Signed)
Order written for repeat MBSS---to be done at [facility]
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
## 2014-09-18 ENCOUNTER — Encounter: Payer: Self-pay | Admitting: Occupational Therapy

## 2014-09-18 ENCOUNTER — Inpatient Hospital Stay: Payer: Self-pay | Admitting: Physical Medicine & Rehabilitation

## 2014-09-18 ENCOUNTER — Encounter: Payer: Self-pay | Admitting: Physical Medicine & Rehabilitation

## 2014-09-18 ENCOUNTER — Ambulatory Visit: Payer: 59 | Admitting: Occupational Therapy

## 2014-09-18 ENCOUNTER — Ambulatory Visit: Payer: 59

## 2014-09-18 ENCOUNTER — Encounter: Payer: Self-pay | Admitting: Physical Therapy

## 2014-09-18 ENCOUNTER — Ambulatory Visit: Payer: 59 | Admitting: Physical Therapy

## 2014-09-18 DIAGNOSIS — R1314 Dysphagia, pharyngoesophageal phase: Secondary | ICD-10-CM

## 2014-09-18 DIAGNOSIS — R269 Unspecified abnormalities of gait and mobility: Secondary | ICD-10-CM | POA: Diagnosis not present

## 2014-09-18 DIAGNOSIS — M25612 Stiffness of left shoulder, not elsewhere classified: Secondary | ICD-10-CM

## 2014-09-18 DIAGNOSIS — R2681 Unsteadiness on feet: Secondary | ICD-10-CM

## 2014-09-18 DIAGNOSIS — R293 Abnormal posture: Secondary | ICD-10-CM

## 2014-09-18 DIAGNOSIS — M542 Cervicalgia: Secondary | ICD-10-CM

## 2014-09-18 DIAGNOSIS — R29898 Other symptoms and signs involving the musculoskeletal system: Secondary | ICD-10-CM

## 2014-09-18 NOTE — Patient Instructions (Signed)
   You can take medications with water. Take one at a time with small sips, and swallow hard twice.
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
## 2014-09-18 NOTE — Patient Instructions (Signed)
Inferior Capsule Stretch (Passive)   Stand or sit, one hand behind head and down between scapulae as far as possible. With other hand, pull elbow toward head until stretch is felt in armpit. Hold _10-20__ seconds. Repeat _5__ times per session. Do _3__ sessions per day.  ROM: Towel Stretch - with Interior Rotation   Pull left arm up behind back by pulling towel up with other arm. Then repeat other way Hold __5__ seconds. Repeat _10___ times per set.  Do __3__ sessions per day.  http://orth.exer.us/889   Copyright  VHI. All rights reserved.

## 2014-09-20 ENCOUNTER — Ambulatory Visit: Payer: 59

## 2014-09-20 ENCOUNTER — Ambulatory Visit: Payer: 59 | Admitting: Occupational Therapy

## 2014-09-20 ENCOUNTER — Ambulatory Visit: Payer: 59 | Admitting: Physical Therapy

## 2014-09-20 ENCOUNTER — Encounter: Payer: Self-pay | Admitting: Occupational Therapy

## 2014-09-20 ENCOUNTER — Encounter: Payer: Self-pay | Admitting: Physical Therapy

## 2014-09-20 ENCOUNTER — Other Ambulatory Visit (HOSPITAL_COMMUNITY): Payer: Self-pay | Admitting: Physical Medicine & Rehabilitation

## 2014-09-20 DIAGNOSIS — R293 Abnormal posture: Secondary | ICD-10-CM

## 2014-09-20 DIAGNOSIS — R29898 Other symptoms and signs involving the musculoskeletal system: Secondary | ICD-10-CM

## 2014-09-20 DIAGNOSIS — R2681 Unsteadiness on feet: Secondary | ICD-10-CM

## 2014-09-20 DIAGNOSIS — R1310 Dysphagia, unspecified: Secondary | ICD-10-CM

## 2014-09-20 DIAGNOSIS — M542 Cervicalgia: Secondary | ICD-10-CM

## 2014-09-20 DIAGNOSIS — R269 Unspecified abnormalities of gait and mobility: Secondary | ICD-10-CM | POA: Diagnosis not present
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
## 2014-09-20 NOTE — Patient Instructions (Signed)
  Strengthening: Resisted Flexion   Hold tubing with __Lt___ arm(s) at side. Pull forward and up. Move shoulder through pain-free range of motion. Repeat __10__ times per set.  Do _2_ sessions per day , every other day   Strengthening: Resisted Extension   Hold tubing in __Lt___ hand(s), arm forward. Pull arm back, elbow straight. Repeat _10___ times per set. Do _2___ sessions per day, every other day.   (Home) Abduction: Lateral Resist (Anchor)   Opposite side toward anchor, right arm across body, lift arm out to side to shoulder height, keeping thumb up. Repeat _10___ times per set. Do __2__ sessions per day, every other day.          Elbow Flexion: Resisted   With tubing held in __Lt____ hand(s) and other end secured under foot, curl arm up as far as possible. Repeat _10___ times per set. Do _2___ sessions per day, every other day.    Elbow Extension: Resisted   Sit in chair with resistive band secured at armrest (or hold with Rt hand at shoulder level) and ___Lt____ elbow bent. Straighten Lt elbow. Repeat _10___ times per set.  Do _2___ sessions per day, every other day.   Copyright  VHI. All rights reserved.

## 2014-09-26 ENCOUNTER — Ambulatory Visit: Payer: 59 | Admitting: Physical Therapy

## 2014-09-26 ENCOUNTER — Ambulatory Visit: Payer: 59 | Attending: Registered Nurse | Admitting: Occupational Therapy

## 2014-09-26 ENCOUNTER — Ambulatory Visit: Payer: 59

## 2014-09-26 ENCOUNTER — Encounter: Payer: Self-pay | Admitting: Physical Therapy

## 2014-09-26 DIAGNOSIS — R293 Abnormal posture: Secondary | ICD-10-CM | POA: Diagnosis present

## 2014-09-26 DIAGNOSIS — M25612 Stiffness of left shoulder, not elsewhere classified: Secondary | ICD-10-CM | POA: Insufficient documentation

## 2014-09-26 DIAGNOSIS — R269 Unspecified abnormalities of gait and mobility: Secondary | ICD-10-CM | POA: Insufficient documentation

## 2014-09-26 DIAGNOSIS — R29898 Other symptoms and signs involving the musculoskeletal system: Secondary | ICD-10-CM | POA: Insufficient documentation

## 2014-09-26 DIAGNOSIS — H539 Unspecified visual disturbance: Secondary | ICD-10-CM | POA: Insufficient documentation

## 2014-09-26 DIAGNOSIS — R52 Pain, unspecified: Secondary | ICD-10-CM | POA: Insufficient documentation

## 2014-09-26 DIAGNOSIS — M6289 Other specified disorders of muscle: Secondary | ICD-10-CM | POA: Diagnosis present

## 2014-09-26 DIAGNOSIS — R1314 Dysphagia, pharyngoesophageal phase: Secondary | ICD-10-CM | POA: Diagnosis present

## 2014-09-26 DIAGNOSIS — M542 Cervicalgia: Secondary | ICD-10-CM

## 2014-09-26 DIAGNOSIS — R1313 Dysphagia, pharyngeal phase: Secondary | ICD-10-CM | POA: Diagnosis present

## 2014-09-26 DIAGNOSIS — R2681 Unsteadiness on feet: Secondary | ICD-10-CM
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
## 2014-09-27 ENCOUNTER — Ambulatory Visit: Payer: 59 | Admitting: Occupational Therapy

## 2014-09-27 ENCOUNTER — Encounter: Payer: Self-pay | Admitting: Occupational Therapy

## 2014-09-27 ENCOUNTER — Encounter: Payer: Self-pay | Admitting: Physical Therapy

## 2014-09-27 ENCOUNTER — Ambulatory Visit: Payer: 59 | Admitting: Physical Therapy

## 2014-09-27 DIAGNOSIS — R29898 Other symptoms and signs involving the musculoskeletal system: Secondary | ICD-10-CM

## 2014-09-27 DIAGNOSIS — R2681 Unsteadiness on feet: Secondary | ICD-10-CM

## 2014-09-27 DIAGNOSIS — R269 Unspecified abnormalities of gait and mobility: Secondary | ICD-10-CM

## 2014-09-27 DIAGNOSIS — M542 Cervicalgia: Secondary | ICD-10-CM

## 2014-09-27 DIAGNOSIS — M25612 Stiffness of left shoulder, not elsewhere classified: Secondary | ICD-10-CM

## 2014-09-27 DIAGNOSIS — R293 Abnormal posture: Secondary | ICD-10-CM
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
## 2014-09-28 ENCOUNTER — Ambulatory Visit (HOSPITAL_COMMUNITY)
Admission: RE | Admit: 2014-09-28 | Discharge: 2014-09-28 | Disposition: A | Discharge status: Home / Self Care | Payer: 59 | Source: Ambulatory Visit | Attending: Physical Medicine & Rehabilitation | Admitting: Physical Medicine & Rehabilitation

## 2014-09-28 DIAGNOSIS — R1310 Dysphagia, unspecified: Secondary | ICD-10-CM

## 2014-10-02 ENCOUNTER — Ambulatory Visit: Payer: 59

## 2014-10-02 ENCOUNTER — Telehealth: Payer: Self-pay | Admitting: *Deleted

## 2014-10-02 ENCOUNTER — Encounter: Payer: Self-pay | Admitting: Occupational Therapy

## 2014-10-02 ENCOUNTER — Ambulatory Visit: Payer: 59 | Admitting: Physical Therapy

## 2014-10-02 ENCOUNTER — Encounter: Payer: Self-pay | Admitting: Physical Therapy

## 2014-10-02 ENCOUNTER — Ambulatory Visit: Payer: 59 | Admitting: Occupational Therapy

## 2014-10-02 DIAGNOSIS — R2681 Unsteadiness on feet: Secondary | ICD-10-CM

## 2014-10-02 DIAGNOSIS — R1314 Dysphagia, pharyngoesophageal phase: Secondary | ICD-10-CM

## 2014-10-02 DIAGNOSIS — R1313 Dysphagia, pharyngeal phase: Secondary | ICD-10-CM

## 2014-10-02 DIAGNOSIS — M542 Cervicalgia: Secondary | ICD-10-CM

## 2014-10-02 DIAGNOSIS — M25612 Stiffness of left shoulder, not elsewhere classified: Secondary | ICD-10-CM

## 2014-10-02 DIAGNOSIS — R29898 Other symptoms and signs involving the musculoskeletal system: Secondary | ICD-10-CM

## 2014-10-02 DIAGNOSIS — R293 Abnormal posture: Secondary | ICD-10-CM

## 2014-10-02 DIAGNOSIS — R269 Unspecified abnormalities of gait and mobility: Secondary | ICD-10-CM

## 2014-10-02 NOTE — Patient Instructions (Signed)
Please make sure you are following the precautions on your yellow sheet Kendal Hymen gave to you during your meals and having puree with your medications.
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
## 2014-10-02 NOTE — Telephone Encounter (Signed)
Order placed to remove gastro tube per Anola Gurney MD
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
## 2014-10-03 ENCOUNTER — Ambulatory Visit (HOSPITAL_COMMUNITY)
Admission: RE | Admit: 2014-10-03 | Discharge: 2014-10-03 | Disposition: A | Discharge status: Home / Self Care | Payer: 59 | Source: Ambulatory Visit | Attending: Physical Medicine & Rehabilitation | Admitting: Physical Medicine & Rehabilitation

## 2014-10-03 DIAGNOSIS — R1314 Dysphagia, pharyngoesophageal phase: Secondary | ICD-10-CM

## 2014-10-03 DIAGNOSIS — R1310 Dysphagia, unspecified: Secondary | ICD-10-CM | POA: Insufficient documentation

## 2014-10-03 DIAGNOSIS — Z431 Encounter for attention to gastrostomy: Secondary | ICD-10-CM | POA: Insufficient documentation

## 2014-10-03 MED ORDER — LIDOCAINE VISCOUS 2 % MT SOLN
OROMUCOSAL | Status: AC
  Filled 2014-10-03: qty 15

## 2014-10-03 NOTE — Procedures (Signed)
Successful bedside removal of an intact pull through G-tube. No immediate post procedural complications.  Katherina Right, MD Pager #: [phone]

## 2014-10-04 ENCOUNTER — Ambulatory Visit: Payer: 59 | Admitting: Occupational Therapy

## 2014-10-04 ENCOUNTER — Ambulatory Visit: Payer: 59 | Admitting: Physical Therapy

## 2014-10-04 ENCOUNTER — Encounter: Payer: Self-pay | Admitting: Physical Therapy

## 2014-10-04 DIAGNOSIS — R29898 Other symptoms and signs involving the musculoskeletal system: Secondary | ICD-10-CM

## 2014-10-04 DIAGNOSIS — R2681 Unsteadiness on feet: Secondary | ICD-10-CM

## 2014-10-04 DIAGNOSIS — M542 Cervicalgia: Secondary | ICD-10-CM

## 2014-10-04 DIAGNOSIS — R293 Abnormal posture: Secondary | ICD-10-CM

## 2014-10-04 DIAGNOSIS — M25612 Stiffness of left shoulder, not elsewhere classified: Secondary | ICD-10-CM | POA: Diagnosis not present
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
## 2014-10-09 ENCOUNTER — Encounter: Payer: Self-pay | Admitting: Physical Therapy

## 2014-10-09 ENCOUNTER — Ambulatory Visit: Payer: 59 | Admitting: Physical Therapy

## 2014-10-09 DIAGNOSIS — R293 Abnormal posture: Secondary | ICD-10-CM

## 2014-10-09 DIAGNOSIS — R52 Pain, unspecified: Secondary | ICD-10-CM

## 2014-10-09 DIAGNOSIS — M542 Cervicalgia: Secondary | ICD-10-CM

## 2014-10-09 DIAGNOSIS — H539 Unspecified visual disturbance: Secondary | ICD-10-CM

## 2014-10-09 DIAGNOSIS — R2681 Unsteadiness on feet: Secondary | ICD-10-CM

## 2014-10-09 DIAGNOSIS — M25612 Stiffness of left shoulder, not elsewhere classified: Secondary | ICD-10-CM | POA: Diagnosis not present
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
## 2014-10-11 ENCOUNTER — Ambulatory Visit: Payer: 59 | Admitting: Physical Therapy

## 2014-10-11 ENCOUNTER — Encounter: Payer: 59 | Attending: Physical Medicine & Rehabilitation | Admitting: Physical Medicine & Rehabilitation

## 2014-10-11 ENCOUNTER — Encounter: Payer: Self-pay | Admitting: Physical Medicine & Rehabilitation

## 2014-10-11 ENCOUNTER — Encounter: Payer: Self-pay | Admitting: Physical Therapy

## 2014-10-11 ENCOUNTER — Ambulatory Visit: Payer: 59

## 2014-10-11 VITALS — BP 118/77 | HR 84

## 2014-10-11 DIAGNOSIS — S12100S Unspecified displaced fracture of second cervical vertebra, sequela: Secondary | ICD-10-CM

## 2014-10-11 DIAGNOSIS — R51 Headache: Secondary | ICD-10-CM | POA: Insufficient documentation

## 2014-10-11 DIAGNOSIS — S069X4S Unspecified intracranial injury with loss of consciousness 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, sequela: Secondary | ICD-10-CM

## 2014-10-11 DIAGNOSIS — F411 Generalized anxiety disorder: Secondary | ICD-10-CM | POA: Insufficient documentation

## 2014-10-11 DIAGNOSIS — R269 Unspecified abnormalities of gait and mobility: Secondary | ICD-10-CM

## 2014-10-11 DIAGNOSIS — S04011A Injury of optic nerve, right eye, initial encounter: Secondary | ICD-10-CM | POA: Insufficient documentation

## 2014-10-11 DIAGNOSIS — S42002A Fracture of unspecified part of left clavicle, initial encounter for closed fracture: Secondary | ICD-10-CM | POA: Insufficient documentation

## 2014-10-11 DIAGNOSIS — F39 Unspecified mood [affective] disorder: Secondary | ICD-10-CM | POA: Insufficient documentation

## 2014-10-11 DIAGNOSIS — Z87820 Personal history of traumatic brain injury: Secondary | ICD-10-CM | POA: Diagnosis not present

## 2014-10-11 DIAGNOSIS — R2681 Unsteadiness on feet: Secondary | ICD-10-CM

## 2014-10-11 DIAGNOSIS — M542 Cervicalgia: Secondary | ICD-10-CM

## 2014-10-11 DIAGNOSIS — R4189 Other symptoms and signs involving cognitive functions and awareness: Secondary | ICD-10-CM | POA: Diagnosis not present

## 2014-10-11 DIAGNOSIS — R52 Pain, unspecified: Secondary | ICD-10-CM

## 2014-10-11 DIAGNOSIS — R293 Abnormal posture: Secondary | ICD-10-CM

## 2014-10-11 DIAGNOSIS — M25612 Stiffness of left shoulder, not elsewhere classified: Secondary | ICD-10-CM | POA: Diagnosis not present

## 2014-10-11 DIAGNOSIS — R1310 Dysphagia, unspecified: Secondary | ICD-10-CM | POA: Insufficient documentation

## 2014-10-11 DIAGNOSIS — S12600S Unspecified displaced fracture of seventh cervical vertebra, sequela: Secondary | ICD-10-CM

## 2014-10-11 DIAGNOSIS — R1313 Dysphagia, pharyngeal phase: Secondary | ICD-10-CM

## 2014-10-11 MED ORDER — ALPRAZOLAM 0.25 MG PO TABS: ORAL_TABLET | ORAL | Status: DC

## 2014-10-11 MED ORDER — TRAMADOL HCL 50 MG PO TABS: 50.0000 mg | ORAL_TABLET | Freq: Two times a day (BID) | ORAL | Status: DC | PRN
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
## 2014-10-11 NOTE — Patient Instructions (Signed)
BE AGGRESSIVE WITH SHOULDER AND NECK RANGE OF MOTION.  PRACTICE GOOD POSTURE AND REGULAR EXCERCISE
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
## 2014-10-11 NOTE — Patient Instructions (Signed)
You will return in one month so I can see your swallowing

## 2014-10-12 ENCOUNTER — Ambulatory Visit: Payer: 59 | Admitting: Occupational Therapy

## 2014-10-12 ENCOUNTER — Encounter: Payer: Self-pay | Admitting: Occupational Therapy

## 2014-10-12 DIAGNOSIS — M25612 Stiffness of left shoulder, not elsewhere classified: Secondary | ICD-10-CM | POA: Diagnosis not present

## 2014-10-12 DIAGNOSIS — R29898 Other symptoms and signs involving the musculoskeletal system: Secondary | ICD-10-CM
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
## 2014-10-13 ENCOUNTER — Ambulatory Visit: Payer: 59 | Admitting: Occupational Therapy

## 2014-10-13 ENCOUNTER — Ambulatory Visit: Payer: 59 | Admitting: Physical Therapy

## 2014-10-13 DIAGNOSIS — R293 Abnormal posture: Secondary | ICD-10-CM

## 2014-10-13 DIAGNOSIS — R52 Pain, unspecified: Secondary | ICD-10-CM

## 2014-10-13 DIAGNOSIS — M25612 Stiffness of left shoulder, not elsewhere classified: Secondary | ICD-10-CM | POA: Diagnosis not present

## 2014-10-13 DIAGNOSIS — R29898 Other symptoms and signs involving the musculoskeletal system: Secondary | ICD-10-CM
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
## 2014-10-17 ENCOUNTER — Ambulatory Visit: Payer: 59 | Admitting: Physical Therapy

## 2014-10-17 ENCOUNTER — Encounter: Payer: Self-pay | Admitting: Physical Therapy

## 2014-10-17 ENCOUNTER — Ambulatory Visit: Payer: 59 | Admitting: Occupational Therapy

## 2014-10-17 DIAGNOSIS — R52 Pain, unspecified: Secondary | ICD-10-CM

## 2014-10-17 DIAGNOSIS — R2681 Unsteadiness on feet: Secondary | ICD-10-CM

## 2014-10-17 DIAGNOSIS — M25612 Stiffness of left shoulder, not elsewhere classified: Secondary | ICD-10-CM | POA: Diagnosis not present

## 2014-10-17 DIAGNOSIS — R293 Abnormal posture: Secondary | ICD-10-CM

## 2014-10-17 DIAGNOSIS — R269 Unspecified abnormalities of gait and mobility: Secondary | ICD-10-CM

## 2014-10-17 DIAGNOSIS — M542 Cervicalgia: Secondary | ICD-10-CM

## 2014-10-17 DIAGNOSIS — R29898 Other symptoms and signs involving the musculoskeletal system: Secondary | ICD-10-CM
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
## 2014-10-19 ENCOUNTER — Ambulatory Visit: Payer: 59 | Admitting: Physical Therapy

## 2014-10-19 ENCOUNTER — Encounter: Payer: Self-pay | Admitting: Physical Therapy

## 2014-10-19 DIAGNOSIS — M25612 Stiffness of left shoulder, not elsewhere classified: Secondary | ICD-10-CM | POA: Diagnosis not present

## 2014-10-19 DIAGNOSIS — R2681 Unsteadiness on feet: Secondary | ICD-10-CM

## 2014-10-19 DIAGNOSIS — R52 Pain, unspecified: Secondary | ICD-10-CM

## 2014-10-19 DIAGNOSIS — R269 Unspecified abnormalities of gait and mobility: Secondary | ICD-10-CM

## 2014-10-19 DIAGNOSIS — R293 Abnormal posture: Secondary | ICD-10-CM

## 2014-10-19 DIAGNOSIS — M542 Cervicalgia: Secondary | ICD-10-CM
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
## 2014-10-20 ENCOUNTER — Ambulatory Visit: Payer: 59 | Admitting: Occupational Therapy

## 2014-10-20 DIAGNOSIS — M25612 Stiffness of left shoulder, not elsewhere classified: Secondary | ICD-10-CM | POA: Diagnosis not present

## 2014-10-20 DIAGNOSIS — R29898 Other symptoms and signs involving the musculoskeletal system: Secondary | ICD-10-CM

## 2014-10-20 DIAGNOSIS — R52 Pain, unspecified: Secondary | ICD-10-CM
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
## 2014-10-23 ENCOUNTER — Other Ambulatory Visit: Payer: Self-pay | Admitting: Family Medicine

## 2014-10-23 ENCOUNTER — Ambulatory Visit
Admission: RE | Admit: 2014-10-23 | Discharge: 2014-10-23 | Disposition: A | Discharge status: Home / Self Care | Payer: 59 | Source: Ambulatory Visit | Attending: Family Medicine | Admitting: Family Medicine

## 2014-10-23 DIAGNOSIS — R05 Cough: Secondary | ICD-10-CM

## 2014-10-23 DIAGNOSIS — R059 Cough, unspecified: Secondary | ICD-10-CM

## 2014-10-24 ENCOUNTER — Ambulatory Visit: Payer: 59 | Attending: Registered Nurse | Admitting: Physical Therapy

## 2014-10-24 ENCOUNTER — Ambulatory Visit: Payer: 59 | Admitting: Occupational Therapy

## 2014-10-24 ENCOUNTER — Encounter: Payer: Self-pay | Admitting: Physical Therapy

## 2014-10-24 DIAGNOSIS — R2681 Unsteadiness on feet: Secondary | ICD-10-CM | POA: Insufficient documentation

## 2014-10-24 DIAGNOSIS — R293 Abnormal posture: Secondary | ICD-10-CM | POA: Diagnosis present

## 2014-10-24 DIAGNOSIS — R29898 Other symptoms and signs involving the musculoskeletal system: Secondary | ICD-10-CM | POA: Insufficient documentation

## 2014-10-24 DIAGNOSIS — M25612 Stiffness of left shoulder, not elsewhere classified: Secondary | ICD-10-CM

## 2014-10-24 DIAGNOSIS — R269 Unspecified abnormalities of gait and mobility: Secondary | ICD-10-CM | POA: Insufficient documentation

## 2014-10-24 DIAGNOSIS — M542 Cervicalgia: Secondary | ICD-10-CM | POA: Diagnosis present

## 2014-10-24 DIAGNOSIS — M6289 Other specified disorders of muscle: Secondary | ICD-10-CM | POA: Insufficient documentation

## 2014-10-24 DIAGNOSIS — R52 Pain, unspecified: Secondary | ICD-10-CM | POA: Diagnosis present
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
## 2014-10-26 ENCOUNTER — Ambulatory Visit: Payer: 59 | Admitting: Physical Therapy

## 2014-10-26 ENCOUNTER — Encounter: Payer: Self-pay | Admitting: Physical Therapy

## 2014-10-26 ENCOUNTER — Ambulatory Visit: Payer: 59 | Admitting: Occupational Therapy

## 2014-10-26 DIAGNOSIS — M25612 Stiffness of left shoulder, not elsewhere classified: Secondary | ICD-10-CM

## 2014-10-26 DIAGNOSIS — R29898 Other symptoms and signs involving the musculoskeletal system: Secondary | ICD-10-CM

## 2014-10-26 DIAGNOSIS — R2681 Unsteadiness on feet: Secondary | ICD-10-CM

## 2014-10-26 DIAGNOSIS — R293 Abnormal posture: Secondary | ICD-10-CM

## 2014-10-26 DIAGNOSIS — R269 Unspecified abnormalities of gait and mobility: Secondary | ICD-10-CM | POA: Diagnosis not present

## 2014-10-26 DIAGNOSIS — R52 Pain, unspecified: Secondary | ICD-10-CM

## 2014-10-26 DIAGNOSIS — M542 Cervicalgia: Secondary | ICD-10-CM
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
## 2014-10-30 ENCOUNTER — Ambulatory Visit: Payer: 59 | Admitting: Occupational Therapy

## 2014-10-30 ENCOUNTER — Encounter: Payer: Self-pay | Admitting: Occupational Therapy

## 2014-10-30 ENCOUNTER — Ambulatory Visit: Payer: 59 | Admitting: Physical Therapy

## 2014-10-30 DIAGNOSIS — M25612 Stiffness of left shoulder, not elsewhere classified: Secondary | ICD-10-CM

## 2014-10-30 DIAGNOSIS — R269 Unspecified abnormalities of gait and mobility: Secondary | ICD-10-CM | POA: Diagnosis not present

## 2014-10-30 DIAGNOSIS — R29898 Other symptoms and signs involving the musculoskeletal system: Secondary | ICD-10-CM
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
## 2014-10-31 ENCOUNTER — Ambulatory Visit: Payer: 59 | Admitting: Physical Therapy

## 2014-10-31 ENCOUNTER — Ambulatory Visit: Payer: 59 | Admitting: Occupational Therapy

## 2014-10-31 DIAGNOSIS — R269 Unspecified abnormalities of gait and mobility: Secondary | ICD-10-CM | POA: Diagnosis not present

## 2014-10-31 DIAGNOSIS — M25612 Stiffness of left shoulder, not elsewhere classified: Secondary | ICD-10-CM

## 2014-10-31 DIAGNOSIS — R29898 Other symptoms and signs involving the musculoskeletal system: Secondary | ICD-10-CM
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
## 2014-11-06 ENCOUNTER — Encounter: Payer: Self-pay | Admitting: Physical Therapy

## 2014-11-06 NOTE — Therapy (Signed)
[facility] [address] [place], [place], [address] Phone: [phone]   Fax:  [phone]  Patient Details  Name: Tiffany Stevens MRN: [id number] Date of Birth: January 05, 1956 Referring Provider:  No ref. provider found  Encounter Date: 11/06/2014  PHYSICAL THERAPY DISCHARGE SUMMARY  Visits from Start of Care: 21  Current functional level related to goals / functional outcomes:     PT Long Term Goals - 10/26/14 1024    PT LONG TERM GOAL #1   Title Patient is independent with HEP / fitness plan.  (UPDATED [employer] Date: 10/27/2014)   Baseline 10/26/14: pt able to demo all HEP issued to date without cues/assist; Not consistent with performance, spouse plans to help reinforce this with pt.   Status Achieved   PT LONG TERM GOAL #2   Title Cervical pain </= 2/10   (UPDATED Target Date: 10/27/2014)   Baseline 10/24/14: rating pain at 3/10, increased with activity   Status Not Met   PT LONG TERM GOAL #3   Title Cervical AROM within 75% of normal AROM  (UPDATED Target Date: 10/27/2014)   Baseline 10/24/14: flexion and extension both WFL's; Continues with decreased lateral flexion and rotation, however they are within 75% of normal.   Status Achieved   PT LONG TERM GOAL #4   Title Berg Balance >/= 54/56  (Target Date: 09/29/2014)   Baseline MET Berg Balance 56/56   Status Achieved   PT LONG TERM GOAL #5   Title Functional Gait Assessment >25 / 30    (UPDATED Target Date: 10/27/2014)   Baseline 10/26/14: scored 30/30 today   Status Achieved       Remaining deficits: Patient continues to report cervical pain but it has improved to level of 3/10. She is able to perform cervical ROM exercises safely and with functional activities with minimal non-verbal expressions of pain or discomfort.    Education / Equipment: HEP  Plan: Patient agrees to discharge.  Patient goals were partially met. Patient is being discharged due to meeting the  stated rehab goals.  ?????       Emonni Depasquale PT,  DPT 11/06/2014, 7:24 AM  [facility] [address] [place], [place], [address] Phone: [phone]   Fax:  [phone]

## 2014-11-07 ENCOUNTER — Ambulatory Visit: Payer: 59 | Admitting: Occupational Therapy

## 2014-11-07 ENCOUNTER — Ambulatory Visit: Payer: 59 | Admitting: Physical Therapy

## 2014-11-08 ENCOUNTER — Ambulatory Visit: Payer: 59 | Admitting: Physical Therapy

## 2014-11-08 ENCOUNTER — Ambulatory Visit: Payer: 59 | Admitting: Occupational Therapy

## 2014-11-08 DIAGNOSIS — R52 Pain, unspecified: Secondary | ICD-10-CM

## 2014-11-08 DIAGNOSIS — R29898 Other symptoms and signs involving the musculoskeletal system: Secondary | ICD-10-CM

## 2014-11-08 DIAGNOSIS — M25612 Stiffness of left shoulder, not elsewhere classified: Secondary | ICD-10-CM

## 2014-11-08 DIAGNOSIS — R269 Unspecified abnormalities of gait and mobility: Secondary | ICD-10-CM | POA: Diagnosis not present
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
## 2014-11-13 ENCOUNTER — Ambulatory Visit: Payer: 59 | Admitting: Occupational Therapy

## 2014-11-13 ENCOUNTER — Encounter: Payer: Self-pay | Admitting: Occupational Therapy

## 2014-11-13 ENCOUNTER — Ambulatory Visit: Payer: 59 | Admitting: Physical Therapy

## 2014-11-13 DIAGNOSIS — R269 Unspecified abnormalities of gait and mobility: Secondary | ICD-10-CM | POA: Diagnosis not present

## 2014-11-13 DIAGNOSIS — M25612 Stiffness of left shoulder, not elsewhere classified: Secondary | ICD-10-CM

## 2014-11-13 DIAGNOSIS — R29898 Other symptoms and signs involving the musculoskeletal system: Secondary | ICD-10-CM
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
## 2014-11-13 NOTE — Patient Instructions (Signed)
CONTINUE WITH BELOW EXERCISES:   1. Putty exercise for Rt hand 2. Sitting up tall - lift 2 lb. Weight to eye level (straight in front, then to side) x 5 reps each, 2 sets 3. Lying down on back - lift 2 lb. Weight overhead straight in front of you x 10 reps, then perform controlled circles at eye level 4. On hands and knees - rock slowly forward and back, then progress to lifting Rt arm to increase weight over Lt arm 5. Lying on stomach - airplane position ("T") lift arms off bed 2-3 " keeping "T" shape 6. Wall push ups x 10 reps

## 2014-11-15 ENCOUNTER — Ambulatory Visit: Payer: 59 | Admitting: Physical Therapy

## 2014-11-15 ENCOUNTER — Ambulatory Visit: Payer: 59 | Admitting: Occupational Therapy

## 2014-11-27 ENCOUNTER — Encounter: Payer: Self-pay | Admitting: Physical Medicine & Rehabilitation

## 2014-11-27 DIAGNOSIS — F411 Generalized anxiety disorder: Secondary | ICD-10-CM

## 2014-11-27 DIAGNOSIS — S069X4S Unspecified intracranial injury with loss of consciousness 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, sequela: Secondary | ICD-10-CM

## 2014-11-27 MED ORDER — ALPRAZOLAM 0.25 MG PO TABS: ORAL_TABLET | ORAL | Status: DC

## 2014-11-27 NOTE — Telephone Encounter (Signed)
Called in Rx, spoke with Alinda Moneyony (Patient's Husband) advised that I have sent in medication to the pharmacy. [facility]. [facility]

## 2014-12-28 ENCOUNTER — Other Ambulatory Visit: Payer: Self-pay | Admitting: Family Medicine

## 2014-12-28 DIAGNOSIS — Z1231 Encounter for screening mammogram for malignant neoplasm of breast: Secondary | ICD-10-CM

## 2015-01-10 ENCOUNTER — Encounter: Payer: Self-pay | Admitting: Physical Medicine & Rehabilitation

## 2015-01-10 ENCOUNTER — Encounter: Payer: 59 | Attending: Physical Medicine & Rehabilitation | Admitting: Physical Medicine & Rehabilitation

## 2015-01-10 VITALS — BP 107/71 | HR 80 | Resp 12

## 2015-01-10 DIAGNOSIS — S12600S Unspecified displaced fracture of seventh cervical vertebra, sequela: Secondary | ICD-10-CM | POA: Diagnosis not present

## 2015-01-10 DIAGNOSIS — R4189 Other symptoms and signs involving cognitive functions and awareness: Secondary | ICD-10-CM | POA: Diagnosis not present

## 2015-01-10 DIAGNOSIS — S069X4S Unspecified intracranial injury with loss of consciousness 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, sequela: Secondary | ICD-10-CM | POA: Diagnosis not present

## 2015-01-10 DIAGNOSIS — F39 Unspecified mood [affective] disorder: Secondary | ICD-10-CM | POA: Diagnosis not present

## 2015-01-10 DIAGNOSIS — Z87820 Personal history of traumatic brain injury: Secondary | ICD-10-CM | POA: Diagnosis not present

## 2015-01-10 DIAGNOSIS — R1310 Dysphagia, unspecified: Secondary | ICD-10-CM | POA: Insufficient documentation

## 2015-01-10 DIAGNOSIS — S42002A Fracture of unspecified part of left clavicle, initial encounter for closed fracture: Secondary | ICD-10-CM | POA: Insufficient documentation

## 2015-01-10 DIAGNOSIS — R51 Headache: Secondary | ICD-10-CM | POA: Diagnosis not present

## 2015-01-10 DIAGNOSIS — F411 Generalized anxiety disorder: Secondary | ICD-10-CM

## 2015-01-10 DIAGNOSIS — S12100S Unspecified displaced fracture of second cervical vertebra, sequela: Secondary | ICD-10-CM

## 2015-01-10 DIAGNOSIS — S12691S Other nondisplaced fracture of seventh cervical vertebra, sequela: Secondary | ICD-10-CM

## 2015-01-10 DIAGNOSIS — S04011A Injury of optic nerve, right eye, initial encounter: Secondary | ICD-10-CM | POA: Insufficient documentation

## 2015-01-10 MED ORDER — TRAMADOL HCL 50 MG PO TABS: 25.0000 mg | ORAL_TABLET | Freq: Two times a day (BID) | ORAL | Status: DC | PRN

## 2015-01-10 MED ORDER — LIDOCAINE 5 % EX PTCH: 1.0000 | MEDICATED_PATCH | CUTANEOUS | Status: DC

## 2015-01-10 MED ORDER — ALPRAZOLAM 0.25 MG PO TABS: ORAL_TABLET | ORAL | Status: DC

## 2015-01-10 NOTE — Patient Instructions (Signed)
Cervical Strain and Sprain With Rehab  Cervical strain and sprain are injuries that commonly occur with "whiplash" injuries. Whiplash occurs when the neck is forcefully whipped backward or forward, such as during a motor vehicle accident or during contact sports. The muscles, ligaments, tendons, discs, and nerves of the neck are susceptible to injury when this occurs.  RISK FACTORS  Risk of having a whiplash injury increases if:  · Osteoarthritis of the spine.  · Situations that make head or neck accidents or trauma more likely.  · High-risk sports (football, rugby, wrestling, hockey, auto racing, gymnastics, diving, contact karate, or boxing).  · Poor strength and flexibility of the neck.  · Previous neck injury.  · Poor tackling technique.  · Improperly fitted or padded equipment.  SYMPTOMS   · Pain or stiffness in the front or back of neck or both.  · Symptoms may present immediately or up to 24 hours after injury.  · Dizziness, headache, nausea, and vomiting.  · Muscle spasm with soreness and stiffness in the neck.  · Tenderness and swelling at the injury site.  PREVENTION  · Learn and use proper technique (avoid tackling with the head, spearing, and head-butting; use proper falling techniques to avoid landing on the head).  · Warm up and stretch properly before activity.  · Maintain physical fitness:    Strength, flexibility, and endurance.    Cardiovascular fitness.  · Wear properly fitted and padded protective equipment, such as padded soft collars, for participation in contact sports.  PROGNOSIS   Recovery from cervical strain and sprain injuries is dependent on the extent of the injury. These injuries are usually curable in 1 week to 3 months with appropriate treatment.   RELATED COMPLICATIONS   · Temporary numbness and weakness may occur if the nerve roots are damaged, and this may persist until the nerve has completely healed.  · Chronic pain due to frequent recurrence of symptoms.  · Prolonged healing,  especially if activity is resumed too soon (before complete recovery).  TREATMENT   Treatment initially involves the use of ice and medication to help reduce pain and inflammation. It is also important to perform strengthening and stretching exercises and modify activities that worsen symptoms so the injury does not get worse. These exercises may be performed at home or with a therapist. For patients who experience severe symptoms, a soft, padded collar may be recommended to be worn around the neck.   Improving your posture may help reduce symptoms. Posture improvement includes pulling your chin and abdomen in while sitting or standing. If you are sitting, sit in a firm chair with your buttocks against the back of the chair. While sleeping, try replacing your pillow with a small towel rolled to 2 inches in diameter, or use a cervical pillow or soft cervical collar. Poor sleeping positions delay healing.   For patients with nerve root damage, which causes numbness or weakness, the use of a cervical traction apparatus may be recommended. Surgery is rarely necessary for these injuries. However, cervical strain and sprains that are present at birth (congenital) may require surgery.  MEDICATION   · If pain medication is necessary, nonsteroidal anti-inflammatory medications, such as aspirin and ibuprofen, or other minor pain relievers, such as acetaminophen, are often recommended.  · Do not take pain medication for 7 days before surgery.  · Prescription pain relievers may be given if deemed necessary by your caregiver. Use only as directed and only as much as you need.    HEAT AND COLD:   · Cold treatment (icing) relieves pain and reduces inflammation. Cold treatment should be applied for 10 to 15 minutes every 2 to 3 hours for inflammation and pain and immediately after any activity that aggravates your symptoms. Use ice packs or an ice massage.  · Heat treatment may be used prior to performing the stretching and  strengthening activities prescribed by your caregiver, physical therapist, or athletic trainer. Use a heat pack or a warm soak.  SEEK MEDICAL CARE IF:   · Symptoms get worse or do not improve in 2 weeks despite treatment.  · New, unexplained symptoms develop (drugs used in treatment may produce side effects).  EXERCISES  RANGE OF MOTION (ROM) AND STRETCHING EXERCISES - Cervical Strain and Sprain  These exercises may help you when beginning to rehabilitate your injury. In order to successfully resolve your symptoms, you must improve your posture. These exercises are designed to help reduce the forward-head and rounded-shoulder posture which contributes to this condition. Your symptoms may resolve with or without further involvement from your physician, physical therapist or athletic trainer. While completing these exercises, remember:   · Restoring tissue flexibility helps normal motion to return to the joints. This allows healthier, less painful movement and activity.  · An effective stretch should be held for at least 20 seconds, although you may need to begin with shorter hold times for comfort.  · A stretch should never be painful. You should only feel a gentle lengthening or release in the stretched tissue.  STRETCH- Axial Extensors  · Lie on your back on the floor. You may bend your knees for comfort. Place a rolled-up hand towel or dish towel, about 2 inches in diameter, under the part of your head that makes contact with the floor.  · Gently tuck your chin, as if trying to make a "double chin," until you feel a gentle stretch at the base of your head.  · Hold __________ seconds.  Repeat __________ times. Complete this exercise __________ times per day.   STRETCH - Axial Extension   · Stand or sit on a firm surface. Assume a good posture: chest up, shoulders drawn back, abdominal muscles slightly tense, knees unlocked (if standing) and feet hip width apart.  · Slowly retract your chin so your head slides back  and your chin slightly lowers. Continue to look straight ahead.  · You should feel a gentle stretch in the back of your head. Be certain not to feel an aggressive stretch since this can cause headaches later.  · Hold for __________ seconds.  Repeat __________ times. Complete this exercise __________ times per day.  STRETCH - Cervical Side Bend   · Stand or sit on a firm surface. Assume a good posture: chest up, shoulders drawn back, abdominal muscles slightly tense, knees unlocked (if standing) and feet hip width apart.  · Without letting your nose or shoulders move, slowly tip your right / left ear to your shoulder until your feel a gentle stretch in the muscles on the opposite side of your neck.  · Hold __________ seconds.  Repeat __________ times. Complete this exercise __________ times per day.  STRETCH - Cervical Rotators   · Stand or sit on a firm surface. Assume a good posture: chest up, shoulders drawn back, abdominal muscles slightly tense, knees unlocked (if standing) and feet hip width apart.  · Keeping your eyes level with the ground, slowly turn your head until you feel a gentle stretch along   the back and opposite side of your neck.  · Hold __________ seconds.  Repeat __________ times. Complete this exercise __________ times per day.  RANGE OF MOTION - Neck Circles   · Stand or sit on a firm surface. Assume a good posture: chest up, shoulders drawn back, abdominal muscles slightly tense, knees unlocked (if standing) and feet hip width apart.  · Gently roll your head down and around from the back of one shoulder to the back of the other. The motion should never be forced or painful.  · Repeat the motion 10-20 times, or until you feel the neck muscles relax and loosen.  Repeat __________ times. Complete the exercise __________ times per day.  STRENGTHENING EXERCISES - Cervical Strain and Sprain  These exercises may help you when beginning to rehabilitate your injury. They may resolve your symptoms with or  without further involvement from your physician, physical therapist, or athletic trainer. While completing these exercises, remember:   · Muscles can gain both the endurance and the strength needed for everyday activities through controlled exercises.  · Complete these exercises as instructed by your physician, physical therapist, or athletic trainer. Progress the resistance and repetitions only as guided.  · You may experience muscle soreness or fatigue, but the pain or discomfort you are trying to eliminate should never worsen during these exercises. If this pain does worsen, stop and make certain you are following the directions exactly. If the pain is still present after adjustments, discontinue the exercise until you can discuss the trouble with your clinician.  STRENGTH - Cervical Flexors, Isometric  · Face a wall, standing about 6 inches away. Place a small pillow, a ball about 6-8 inches in diameter, or a folded towel between your forehead and the wall.  · Slightly tuck your chin and gently push your forehead into the soft object. Push only with mild to moderate intensity, building up tension gradually. Keep your jaw and forehead relaxed.  · Hold 10 to 20 seconds. Keep your breathing relaxed.  · Release the tension slowly. Relax your neck muscles completely before you start the next repetition.  Repeat __________ times. Complete this exercise __________ times per day.  STRENGTH- Cervical Lateral Flexors, Isometric   · Stand about 6 inches away from a wall. Place a small pillow, a ball about 6-8 inches in diameter, or a folded towel between the side of your head and the wall.  · Slightly tuck your chin and gently tilt your head into the soft object. Push only with mild to moderate intensity, building up tension gradually. Keep your jaw and forehead relaxed.  · Hold 10 to 20 seconds. Keep your breathing relaxed.  · Release the tension slowly. Relax your neck muscles completely before you start the next  repetition.  Repeat __________ times. Complete this exercise __________ times per day.  STRENGTH - Cervical Extensors, Isometric   · Stand about 6 inches away from a wall. Place a small pillow, a ball about 6-8 inches in diameter, or a folded towel between the back of your head and the wall.  · Slightly tuck your chin and gently tilt your head back into the soft object. Push only with mild to moderate intensity, building up tension gradually. Keep your jaw and forehead relaxed.  · Hold 10 to 20 seconds. Keep your breathing relaxed.  · Release the tension slowly. Relax your neck muscles completely before you start the next repetition.  Repeat __________ times. Complete this exercise __________ times per day.    POSTURE AND BODY MECHANICS CONSIDERATIONS - Cervical Strain and Sprain  Keeping correct posture when sitting, standing or completing your activities will reduce the stress put on different body tissues, allowing injured tissues a chance to heal and limiting painful experiences. The following are general guidelines for improved posture. Your physician or physical therapist will provide you with any instructions specific to your needs. While reading these guidelines, remember:  · The exercises prescribed by your provider will help you have the flexibility and strength to maintain correct postures.  · The correct posture provides the optimal environment for your joints to work. All of your joints have less wear and tear when properly supported by a spine with good posture. This means you will experience a healthier, less painful body.  · Correct posture must be practiced with all of your activities, especially prolonged sitting and standing. Correct posture is as important when doing repetitive low-stress activities (typing) as it is when doing a single heavy-load activity (lifting).  PROLONGED STANDING WHILE SLIGHTLY LEANING FORWARD  When completing a task that requires you to lean forward while standing in one  place for a long time, place either foot up on a stationary 2- to 4-inch high object to help maintain the best posture. When both feet are on the ground, the low back tends to lose its slight inward curve. If this curve flattens (or becomes too large), then the back and your other joints will experience too much stress, fatigue more quickly, and can cause pain.   RESTING POSITIONS  Consider which positions are most painful for you when choosing a resting position. If you have pain with flexion-based activities (sitting, bending, stooping, squatting), choose a position that allows you to rest in a less flexed posture. You would want to avoid curling into a fetal position on your side. If your pain worsens with extension-based activities (prolonged standing, working overhead), avoid resting in an extended position such as sleeping on your stomach. Most people will find more comfort when they rest with their spine in a more neutral position, neither too rounded nor too arched. Lying on a non-sagging bed on your side with a pillow between your knees, or on your back with a pillow under your knees will often provide some relief. Keep in mind, being in any one position for a prolonged period of time, no matter how correct your posture, can still lead to stiffness.  WALKING  Walk with an upright posture. Your ears, shoulders, and hips should all line up.  OFFICE WORK  When working at a desk, create an environment that supports good, upright posture. Without extra support, muscles fatigue and lead to excessive strain on joints and other tissues.  CHAIR:  · A chair should be able to slide under your desk when your back makes contact with the back of the chair. This allows you to work closely.  · The chair's height should allow your eyes to be level with the upper part of your monitor and your hands to be slightly lower than your elbows.  · Body position:    Your feet should make contact with the floor. If this is not  possible, use a foot rest.    Keep your ears over your shoulders. This will reduce stress on your neck and low back.     This information is not intended to replace advice given to you by your health care provider. Make sure you discuss any questions you have with your health care provider.       Document Released: 01/06/2005 Document Revised: 01/27/2014 Document Reviewed: 04/20/2008  [employer] Interactive Patient Education ©2016 [employer]

## 2015-01-10 NOTE — Progress Notes (Signed)
Subjective:    Patient ID: [id number], female    DOB: [birth year], 59 y.o.   MRN: [id number]  HPI   Tiffany Stevens is here in follow up of he TBI. She is having some residual swallowing issues (things get caught when she swallows). She is also still having cervicalgia as well. She gets massage which helps. She is still using the tramadol occasionally for severe pain. She takes xanax on occasion for anxiety   Pain Inventory Average Pain 5 Pain Right Now 5 My pain is dull  In the last 24 hours, has pain interfered with the following? General activity 4 Relation with others 4 Enjoyment of life 4 What TIME of day is your pain at its worst? morning Sleep (in general) Fair  Pain is worse with: some activites Pain improves with: medication Relief from Meds: 8  Mobility walk without assistance  Function Do you have any goals in this area?  yes  Neuro/Psych No problems in this area  Prior Studies Any changes since last visit?  no  Physicians involved in your care Primary care De[facility] McDerm[facility]-U[facility]   Family History  Problem Relation Age of Onset  . Cancer Mother     colon   Social History   Social History  . Marital Status: Unknown    Spouse Name: N/A  . Number of Children: N/A  . Years of Education: N/A   Social History Main Topics  . Smoking status: Never Smoker   . Smokeless tobacco: Never Used  . Alcohol Use: None  . Drug Use: None  . Sexual Activity: Not Asked   Other Topics Concern  . None   Social History Narrative   Past Surgical History  Procedure Laterality Date  . Anterior cervical decomp/discectomy fusion N/A 06/27/2014    Procedure: ANTERIOR CERVICAL DECOMPRESSION/DISCECTOMY FUSION CERVICAL SIX-SEVEN;  Surgeon: Hilda Lias, MD;  Location: [facility];  Service: Neurosurgery;  Laterality: N/A;  . Tracheostomy tube placement N/A 07/04/2014    Procedure: TRACHEOSTOMY;  Surgeon: Drema Halon, MD;  Location: [facility] OR;  Service: ENT;   Laterality: N/A;  . Closed reduction nasal fracture N/A 07/04/2014    Procedure: CLOSED REDUCTION NASAL FRACTURE;  Surgeon: Drema Halon, MD;  Location: [facility];  Service: ENT;  Laterality: N/A;  . Hemorrhoid surgery  06/04/10    with rectopexy.   Past Medical History  Diagnosis Date  . Osteoporosis   . Seasonal allergies   . Constipation   . Hemorrhoids    BP 107/71 mmHg  Pulse 80  Resp 12  SpO2 98%  LMP  (LMP Unknown)  Opioid Risk Score:   Fall Risk Score:  `1  Depression screen PHQ 2/9  Depression screen [facility] 2/9 10/11/2014 08/22/2014  Decreased Interest 1 1  Down, Depressed, Hopeless 1 1  PHQ - 2 Score 2 2  Altered sleeping - 2  Tired, decreased energy - 2  Change in appetite - 2  Feeling bad or failure about yourself  - 2  Trouble concentrating - 2  Moving slowly or fidgety/restless - 1  Suicidal thoughts - 0  PHQ-9 Score - 13      Review of Systems  All other systems reviewed and are negative.      Objective:   Physical Exam  Phonation  Eyes: Pupils are equal, round, and reactive to light. No abnl seen in right eye.  Mouth: mild scarring over right tongue.  Cardiovascular: Regular rhythm. Rate controlled.  Respiratory: Effort normal. No  respiratory distress. Normal breathing effort.  GI: Soft. Bowel sounds are normal. PEG site clean, appropriately tender. No drainage and minimal blood Musculoskeletal: She exhibits tenderness which is mild. Left SCM remains tight but neck ROM improved.  Neurological: She is alert and oriented to person, place, and time. She displays normal reflexes. She exhibits normal muscle tone.  Left shoulder 5/5 l . biceps 4-/5, 4/5 triceps, grip 4/5 Right shoulder 4/5, biceps 45, triceps 2+/5, grip 3/5.  Bilateral LE: HF 4/5, KE 4/5, ADF/APF 5.  Skin: Skin is warm and dry.  Wounds healed Psychiatric: Her affect is more appropriate. She is a little anxious.    Assessment/Plan:  1. 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 Mood: xanax prn for anxiety. Loss of appetite may be related to this, perhaps mild CN 1 injury also.  5. Dysphagia: resolved, peg out  6. Distal left clavicle fx---resolved.    Thirty minutes of face to face patient care time were spent during this visit. All questions were encouraged and answered. Follow up in 6 months

## 2015-01-19 ENCOUNTER — Ambulatory Visit
Admission: RE | Admit: 2015-01-19 | Discharge: 2015-01-19 | Disposition: A | Discharge status: Home / Self Care | Payer: 59 | Source: Ambulatory Visit | Attending: Family Medicine | Admitting: Family Medicine

## 2015-01-19 ENCOUNTER — Ambulatory Visit: Payer: 59

## 2015-01-19 DIAGNOSIS — Z1231 Encounter for screening mammogram for malignant neoplasm of breast: Secondary | ICD-10-CM

## 2015-01-23 ENCOUNTER — Other Ambulatory Visit: Payer: Self-pay | Admitting: Family Medicine

## 2015-01-23 DIAGNOSIS — R928 Other abnormal and inconclusive findings on diagnostic imaging of breast: Secondary | ICD-10-CM

## 2015-01-24 ENCOUNTER — Ambulatory Visit
Admission: RE | Admit: 2015-01-24 | Discharge: 2015-01-24 | Disposition: A | Discharge status: Home / Self Care | Payer: 59 | Source: Ambulatory Visit | Attending: Family Medicine | Admitting: Family Medicine

## 2015-01-24 DIAGNOSIS — R928 Other abnormal and inconclusive findings on diagnostic imaging of breast: Secondary | ICD-10-CM

## 2015-01-25 ENCOUNTER — Other Ambulatory Visit: Payer: 59

## 2015-01-26 DIAGNOSIS — H1132 Conjunctival hemorrhage, left eye: Secondary | ICD-10-CM | POA: Diagnosis not present

## 2015-03-14 DIAGNOSIS — H47011 Ischemic optic neuropathy, right eye: Secondary | ICD-10-CM | POA: Diagnosis not present

## 2015-03-14 DIAGNOSIS — H2513 Age-related nuclear cataract, bilateral: Secondary | ICD-10-CM | POA: Diagnosis not present

## 2015-03-14 DIAGNOSIS — H4720 Unspecified optic atrophy: Secondary | ICD-10-CM | POA: Diagnosis not present

## 2015-03-28 DIAGNOSIS — J329 Chronic sinusitis, unspecified: Secondary | ICD-10-CM | POA: Diagnosis not present

## 2015-03-28 DIAGNOSIS — L659 Nonscarring hair loss, unspecified: Secondary | ICD-10-CM | POA: Diagnosis not present

## 2015-03-28 DIAGNOSIS — L719 Rosacea, unspecified: Secondary | ICD-10-CM | POA: Diagnosis not present

## 2015-04-02 ENCOUNTER — Ambulatory Visit: Payer: 59 | Attending: Family Medicine

## 2015-04-02 DIAGNOSIS — R293 Abnormal posture: Secondary | ICD-10-CM | POA: Diagnosis not present

## 2015-04-02 DIAGNOSIS — R52 Pain, unspecified: Secondary | ICD-10-CM | POA: Insufficient documentation

## 2015-04-02 DIAGNOSIS — M6281 Muscle weakness (generalized): Secondary | ICD-10-CM | POA: Diagnosis not present

## 2015-04-02 DIAGNOSIS — M6289 Other specified disorders of muscle: Secondary | ICD-10-CM | POA: Diagnosis not present

## 2015-04-02 DIAGNOSIS — M542 Cervicalgia: Secondary | ICD-10-CM | POA: Insufficient documentation

## 2015-04-02 DIAGNOSIS — R29898 Other symptoms and signs involving the musculoskeletal system: Secondary | ICD-10-CM | POA: Insufficient documentation

## 2015-04-02 DIAGNOSIS — M25612 Stiffness of left shoulder, not elsewhere classified: Secondary | ICD-10-CM | POA: Diagnosis not present
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
## 2015-04-05 ENCOUNTER — Ambulatory Visit: Payer: 59

## 2015-04-05 DIAGNOSIS — R52 Pain, unspecified: Secondary | ICD-10-CM | POA: Diagnosis not present

## 2015-04-05 DIAGNOSIS — R293 Abnormal posture: Secondary | ICD-10-CM

## 2015-04-05 DIAGNOSIS — M542 Cervicalgia: Secondary | ICD-10-CM

## 2015-04-05 DIAGNOSIS — M25612 Stiffness of left shoulder, not elsewhere classified: Secondary | ICD-10-CM | POA: Diagnosis not present

## 2015-04-05 DIAGNOSIS — M6281 Muscle weakness (generalized): Secondary | ICD-10-CM | POA: Diagnosis not present

## 2015-04-05 DIAGNOSIS — R29898 Other symptoms and signs involving the musculoskeletal system: Secondary | ICD-10-CM | POA: Diagnosis not present

## 2015-04-05 DIAGNOSIS — M6289 Other specified disorders of muscle: Secondary | ICD-10-CM | POA: Diagnosis not present
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
## 2015-04-05 NOTE — Patient Instructions (Signed)
Levator Stretch   Grasp seat or sit on hand on side to be stretched. Turn head toward other side and look down. Use hand on head to gently stretch neck in that position. Hold __30__ seconds. Repeat on other side. Repeat __3__ times. Do __3__ sessions per day.  http://gt2.exer.us/30   Copyright  VHI. All rights reserved.  Side-Bending   One hand on opposite side of head, pull head to side as far as is comfortable. Stop if there is pain. Hold __30__ seconds. Repeat with other hand to other side. Repeat _3___ times. Do __3__ sessions per day.   Copyright  VHI. All rights reserved.  Scapular Retraction (Standing)   With arms at sides, pinch shoulder blades together. Repeat _10___ times per set. Do __2__ sets per session. Do __3__ sessions per day.  http://orth.exer.us/944   Copyright  VHI. All rights reserved.     

## 2015-04-09 ENCOUNTER — Ambulatory Visit: Payer: 59 | Admitting: Physical Therapy

## 2015-04-09 DIAGNOSIS — R29898 Other symptoms and signs involving the musculoskeletal system: Secondary | ICD-10-CM | POA: Diagnosis not present

## 2015-04-09 DIAGNOSIS — R293 Abnormal posture: Secondary | ICD-10-CM

## 2015-04-09 DIAGNOSIS — M25612 Stiffness of left shoulder, not elsewhere classified: Secondary | ICD-10-CM

## 2015-04-09 DIAGNOSIS — R52 Pain, unspecified: Secondary | ICD-10-CM | POA: Diagnosis not present

## 2015-04-09 DIAGNOSIS — M6289 Other specified disorders of muscle: Secondary | ICD-10-CM | POA: Diagnosis not present

## 2015-04-09 DIAGNOSIS — M542 Cervicalgia: Secondary | ICD-10-CM

## 2015-04-09 DIAGNOSIS — M6281 Muscle weakness (generalized): Secondary | ICD-10-CM | POA: Diagnosis not present

## 2015-04-09 NOTE — Patient Instructions (Signed)
Over Head Pull: Narrow Grip       On back, knees bent, feet flat, band across thighs, elbows straight but relaxed. Pull hands apart (start). Keeping elbows straight, bring arms up and over head, hands toward floor. Keep pull steady on band. Hold momentarily. Return slowly, keeping pull steady, back to start. Repeat __10_ times. Band color _red_____   Side Pull: Double Arm   On back, knees bent, feet flat. Arms perpendicular to body, shoulder level, elbows straight but relaxed. Pull arms out to sides, elbows straight. Resistance band comes across collarbones, hands toward floor. Hold momentarily. Slowly return to starting position. Repeat _10__ times. Band color _red10____   Sash   On back, knees bent, feet flat, left hand on left hip, right hand above left. Pull right arm DIAGONALLY (hip to shoulder) across chest. Bring right arm along head toward floor. Hold momentarily. Slowly return to starting position. Repeat _10__ times. Do with left arm. Band color _red_____   Shoulder Rotation: Double Arm   On back, knees bent, feet flat, elbows tucked at sides, bent 90, hands palms up. Pull hands apart and down toward floor, keeping elbows near sides. Hold momentarily. Slowly return to starting position. Repeat __10_ times. Band color red______
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
## 2015-04-11 ENCOUNTER — Encounter: Payer: No Typology Code available for payment source | Admitting: Physical Therapy

## 2015-04-17 ENCOUNTER — Ambulatory Visit: Payer: 59

## 2015-04-17 DIAGNOSIS — M542 Cervicalgia: Secondary | ICD-10-CM | POA: Diagnosis not present

## 2015-04-17 DIAGNOSIS — R293 Abnormal posture: Secondary | ICD-10-CM | POA: Diagnosis not present

## 2015-04-17 DIAGNOSIS — M6289 Other specified disorders of muscle: Secondary | ICD-10-CM | POA: Diagnosis not present

## 2015-04-17 DIAGNOSIS — M25612 Stiffness of left shoulder, not elsewhere classified: Secondary | ICD-10-CM

## 2015-04-17 DIAGNOSIS — R29898 Other symptoms and signs involving the musculoskeletal system: Secondary | ICD-10-CM | POA: Diagnosis not present

## 2015-04-17 DIAGNOSIS — R52 Pain, unspecified: Secondary | ICD-10-CM | POA: Diagnosis not present

## 2015-04-17 DIAGNOSIS — M6281 Muscle weakness (generalized): Secondary | ICD-10-CM | POA: Diagnosis not present

## 2015-04-17 NOTE — Patient Instructions (Signed)
5 sec hold, 10 reps each   Isometric Rotation   Put left index finger on left temple. Gently try to turn head to right, pushing against finger. Hold ____ seconds. Repeat on the left. Push and release slowly. Repeat ____ times. Do ____ sessions per day.  http://gt2.exer.us/24   Copyright  VHI. All rights reserved.  Isometric Lateral Flexion   Put right index finger on right temple. Gently try to move right ear toward shoulder, pushing against finger. Hold ____ seconds. Repeat on other side. Push and release slowly. Repeat ____ times. Do ____ sessions per day.  http://gt2.exer.us/22   Copyright  VHI. All rights reserved.  Isometric Flexion   Put the tips of both index fingers lightly on forehead. Gently press into fingers as if looking toward ground. Resist for ____ seconds. Press and release slowly. Repeat ____ times. Do ____ sessions per day.  http://gt2.exer.us/18   Copyright  VHI. All rights reserved.  Isometric Extension   Put index fingers gently on back of head. Slowly try to look toward ceiling. Push head into fingers for ____ seconds. Push and release slowly. Repeat ____ times. Do ____ sessions per day.  http://gt2.exer.us/20   Copyright  VHI. All rights reserved.
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
## 2015-04-19 ENCOUNTER — Ambulatory Visit: Payer: 59

## 2015-04-19 DIAGNOSIS — M542 Cervicalgia: Secondary | ICD-10-CM | POA: Diagnosis not present

## 2015-04-19 DIAGNOSIS — M6289 Other specified disorders of muscle: Secondary | ICD-10-CM | POA: Diagnosis not present

## 2015-04-19 DIAGNOSIS — M6281 Muscle weakness (generalized): Secondary | ICD-10-CM | POA: Diagnosis not present

## 2015-04-19 DIAGNOSIS — R293 Abnormal posture: Secondary | ICD-10-CM | POA: Diagnosis not present

## 2015-04-19 DIAGNOSIS — R52 Pain, unspecified: Secondary | ICD-10-CM | POA: Diagnosis not present

## 2015-04-19 DIAGNOSIS — R29898 Other symptoms and signs involving the musculoskeletal system: Secondary | ICD-10-CM

## 2015-04-19 DIAGNOSIS — M25612 Stiffness of left shoulder, not elsewhere classified: Secondary | ICD-10-CM
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
## 2015-04-24 ENCOUNTER — Ambulatory Visit: Payer: 59 | Attending: Family Medicine

## 2015-04-24 DIAGNOSIS — M6281 Muscle weakness (generalized): Secondary | ICD-10-CM | POA: Diagnosis not present

## 2015-04-24 DIAGNOSIS — R293 Abnormal posture: Secondary | ICD-10-CM | POA: Insufficient documentation

## 2015-04-24 DIAGNOSIS — M542 Cervicalgia: Secondary | ICD-10-CM | POA: Insufficient documentation

## 2015-04-24 DIAGNOSIS — M25612 Stiffness of left shoulder, not elsewhere classified: Secondary | ICD-10-CM | POA: Diagnosis not present
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
## 2015-04-26 ENCOUNTER — Ambulatory Visit: Payer: 59

## 2015-04-26 DIAGNOSIS — M25612 Stiffness of left shoulder, not elsewhere classified: Secondary | ICD-10-CM | POA: Diagnosis not present

## 2015-04-26 DIAGNOSIS — M6281 Muscle weakness (generalized): Secondary | ICD-10-CM

## 2015-04-26 DIAGNOSIS — M542 Cervicalgia: Secondary | ICD-10-CM | POA: Diagnosis not present

## 2015-04-26 DIAGNOSIS — R293 Abnormal posture: Secondary | ICD-10-CM | POA: Diagnosis not present
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
## 2015-04-30 ENCOUNTER — Ambulatory Visit: Payer: 59

## 2015-04-30 DIAGNOSIS — R293 Abnormal posture: Secondary | ICD-10-CM | POA: Diagnosis not present

## 2015-04-30 DIAGNOSIS — M6281 Muscle weakness (generalized): Secondary | ICD-10-CM | POA: Diagnosis not present

## 2015-04-30 DIAGNOSIS — M542 Cervicalgia: Secondary | ICD-10-CM | POA: Diagnosis not present

## 2015-04-30 DIAGNOSIS — M25612 Stiffness of left shoulder, not elsewhere classified: Secondary | ICD-10-CM | POA: Diagnosis not present
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
## 2015-05-02 ENCOUNTER — Encounter: Payer: No Typology Code available for payment source | Admitting: Physical Therapy

## 2015-05-07 ENCOUNTER — Encounter: Payer: Self-pay | Admitting: Physical Therapy

## 2015-05-07 ENCOUNTER — Ambulatory Visit: Payer: 59 | Admitting: Physical Therapy

## 2015-05-07 DIAGNOSIS — M6281 Muscle weakness (generalized): Secondary | ICD-10-CM | POA: Diagnosis not present

## 2015-05-07 DIAGNOSIS — M25612 Stiffness of left shoulder, not elsewhere classified: Secondary | ICD-10-CM

## 2015-05-07 DIAGNOSIS — R293 Abnormal posture: Secondary | ICD-10-CM | POA: Diagnosis not present

## 2015-05-07 DIAGNOSIS — M542 Cervicalgia: Secondary | ICD-10-CM | POA: Diagnosis not present
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
## 2015-05-09 ENCOUNTER — Ambulatory Visit: Payer: 59 | Admitting: Physical Therapy

## 2015-05-09 ENCOUNTER — Encounter: Payer: Self-pay | Admitting: Physical Therapy

## 2015-05-09 DIAGNOSIS — M25612 Stiffness of left shoulder, not elsewhere classified: Secondary | ICD-10-CM | POA: Diagnosis not present

## 2015-05-09 DIAGNOSIS — M542 Cervicalgia: Secondary | ICD-10-CM | POA: Diagnosis not present

## 2015-05-09 DIAGNOSIS — R293 Abnormal posture: Secondary | ICD-10-CM | POA: Diagnosis not present

## 2015-05-09 DIAGNOSIS — M6281 Muscle weakness (generalized): Secondary | ICD-10-CM | POA: Diagnosis not present
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
## 2015-05-21 ENCOUNTER — Ambulatory Visit: Payer: 59 | Attending: Family Medicine | Admitting: Physical Therapy

## 2015-05-21 ENCOUNTER — Encounter: Payer: Self-pay | Admitting: Physical Therapy

## 2015-05-21 DIAGNOSIS — M542 Cervicalgia: Secondary | ICD-10-CM | POA: Insufficient documentation

## 2015-05-21 DIAGNOSIS — M25612 Stiffness of left shoulder, not elsewhere classified: Secondary | ICD-10-CM | POA: Diagnosis not present

## 2015-05-21 DIAGNOSIS — M6281 Muscle weakness (generalized): Secondary | ICD-10-CM | POA: Diagnosis not present

## 2015-05-21 DIAGNOSIS — R29898 Other symptoms and signs involving the musculoskeletal system: Secondary | ICD-10-CM | POA: Insufficient documentation

## 2015-05-21 DIAGNOSIS — R293 Abnormal posture: Secondary | ICD-10-CM | POA: Diagnosis not present
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
## 2015-05-23 ENCOUNTER — Ambulatory Visit: Payer: 59 | Admitting: Physical Therapy

## 2015-05-23 ENCOUNTER — Encounter: Payer: Self-pay | Admitting: Physical Therapy

## 2015-05-23 DIAGNOSIS — M6281 Muscle weakness (generalized): Secondary | ICD-10-CM

## 2015-05-23 DIAGNOSIS — M542 Cervicalgia: Secondary | ICD-10-CM | POA: Diagnosis not present

## 2015-05-23 DIAGNOSIS — R29898 Other symptoms and signs involving the musculoskeletal system: Secondary | ICD-10-CM | POA: Diagnosis not present

## 2015-05-23 DIAGNOSIS — R293 Abnormal posture: Secondary | ICD-10-CM

## 2015-05-23 DIAGNOSIS — M25612 Stiffness of left shoulder, not elsewhere classified: Secondary | ICD-10-CM | POA: Diagnosis not present
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
## 2015-05-28 ENCOUNTER — Ambulatory Visit: Payer: 59 | Admitting: Physical Therapy

## 2015-05-28 DIAGNOSIS — M6281 Muscle weakness (generalized): Secondary | ICD-10-CM

## 2015-05-28 DIAGNOSIS — R293 Abnormal posture: Secondary | ICD-10-CM

## 2015-05-28 DIAGNOSIS — M25612 Stiffness of left shoulder, not elsewhere classified: Secondary | ICD-10-CM | POA: Diagnosis not present

## 2015-05-28 DIAGNOSIS — M542 Cervicalgia: Secondary | ICD-10-CM

## 2015-05-28 DIAGNOSIS — R29898 Other symptoms and signs involving the musculoskeletal system: Secondary | ICD-10-CM | POA: Diagnosis not present
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
## 2015-05-29 ENCOUNTER — Ambulatory Visit: Payer: No Typology Code available for payment source | Admitting: Physical Therapy

## 2015-05-31 ENCOUNTER — Encounter: Payer: Self-pay | Admitting: Physical Therapy

## 2015-05-31 ENCOUNTER — Ambulatory Visit: Payer: 59 | Admitting: Physical Therapy

## 2015-05-31 DIAGNOSIS — M542 Cervicalgia: Secondary | ICD-10-CM

## 2015-05-31 DIAGNOSIS — R29898 Other symptoms and signs involving the musculoskeletal system: Secondary | ICD-10-CM | POA: Diagnosis not present

## 2015-05-31 DIAGNOSIS — R293 Abnormal posture: Secondary | ICD-10-CM | POA: Diagnosis not present

## 2015-05-31 DIAGNOSIS — M6281 Muscle weakness (generalized): Secondary | ICD-10-CM

## 2015-05-31 DIAGNOSIS — M25612 Stiffness of left shoulder, not elsewhere classified: Secondary | ICD-10-CM | POA: Diagnosis not present
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
## 2015-06-04 ENCOUNTER — Ambulatory Visit: Payer: 59 | Admitting: Physical Therapy

## 2015-06-04 DIAGNOSIS — M6281 Muscle weakness (generalized): Secondary | ICD-10-CM

## 2015-06-04 DIAGNOSIS — R29898 Other symptoms and signs involving the musculoskeletal system: Secondary | ICD-10-CM

## 2015-06-04 DIAGNOSIS — R293 Abnormal posture: Secondary | ICD-10-CM

## 2015-06-04 DIAGNOSIS — M542 Cervicalgia: Secondary | ICD-10-CM

## 2015-06-04 DIAGNOSIS — M25612 Stiffness of left shoulder, not elsewhere classified: Secondary | ICD-10-CM
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
## 2015-06-06 ENCOUNTER — Encounter: Payer: Self-pay | Admitting: Physical Therapy

## 2015-06-06 ENCOUNTER — Ambulatory Visit: Payer: 59 | Admitting: Physical Therapy

## 2015-06-06 VITALS — BP 116/102 | HR 69

## 2015-06-06 DIAGNOSIS — M25612 Stiffness of left shoulder, not elsewhere classified: Secondary | ICD-10-CM | POA: Diagnosis not present

## 2015-06-06 DIAGNOSIS — R293 Abnormal posture: Secondary | ICD-10-CM

## 2015-06-06 DIAGNOSIS — M542 Cervicalgia: Secondary | ICD-10-CM

## 2015-06-06 DIAGNOSIS — M6281 Muscle weakness (generalized): Secondary | ICD-10-CM

## 2015-06-06 DIAGNOSIS — R42 Dizziness and giddiness: Secondary | ICD-10-CM | POA: Diagnosis not present

## 2015-06-06 DIAGNOSIS — M25519 Pain in unspecified shoulder: Secondary | ICD-10-CM | POA: Diagnosis not present

## 2015-06-06 DIAGNOSIS — R29898 Other symptoms and signs involving the musculoskeletal system: Secondary | ICD-10-CM | POA: Diagnosis not present

## 2015-06-06 DIAGNOSIS — R030 Elevated blood-pressure reading, without diagnosis of hypertension: Secondary | ICD-10-CM | POA: Diagnosis not present
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
## 2015-06-11 ENCOUNTER — Encounter: Payer: No Typology Code available for payment source | Admitting: Physical Therapy

## 2015-06-12 ENCOUNTER — Ambulatory Visit: Payer: 59 | Admitting: Physical Therapy

## 2015-06-12 DIAGNOSIS — R29898 Other symptoms and signs involving the musculoskeletal system: Secondary | ICD-10-CM | POA: Diagnosis not present

## 2015-06-12 DIAGNOSIS — M542 Cervicalgia: Secondary | ICD-10-CM | POA: Diagnosis not present

## 2015-06-12 DIAGNOSIS — R293 Abnormal posture: Secondary | ICD-10-CM | POA: Diagnosis not present

## 2015-06-12 DIAGNOSIS — M6281 Muscle weakness (generalized): Secondary | ICD-10-CM

## 2015-06-12 DIAGNOSIS — M25612 Stiffness of left shoulder, not elsewhere classified: Secondary | ICD-10-CM | POA: Diagnosis not present

## 2015-06-12 NOTE — Patient Instructions (Signed)
Remove tape if irritating 
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
## 2015-06-13 ENCOUNTER — Encounter: Payer: Self-pay | Admitting: Physical Therapy

## 2015-06-13 ENCOUNTER — Ambulatory Visit: Payer: 59 | Admitting: Physical Therapy

## 2015-06-13 DIAGNOSIS — M542 Cervicalgia: Secondary | ICD-10-CM

## 2015-06-13 DIAGNOSIS — R293 Abnormal posture: Secondary | ICD-10-CM

## 2015-06-13 DIAGNOSIS — M6281 Muscle weakness (generalized): Secondary | ICD-10-CM | POA: Diagnosis not present

## 2015-06-13 DIAGNOSIS — R29898 Other symptoms and signs involving the musculoskeletal system: Secondary | ICD-10-CM | POA: Diagnosis not present

## 2015-06-13 DIAGNOSIS — M25612 Stiffness of left shoulder, not elsewhere classified: Secondary | ICD-10-CM | POA: Diagnosis not present
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
## 2015-06-19 ENCOUNTER — Ambulatory Visit: Payer: 59 | Admitting: Physical Therapy

## 2015-06-19 DIAGNOSIS — M6281 Muscle weakness (generalized): Secondary | ICD-10-CM | POA: Diagnosis not present

## 2015-06-19 DIAGNOSIS — R293 Abnormal posture: Secondary | ICD-10-CM

## 2015-06-19 DIAGNOSIS — M542 Cervicalgia: Secondary | ICD-10-CM | POA: Diagnosis not present

## 2015-06-19 DIAGNOSIS — M25612 Stiffness of left shoulder, not elsewhere classified: Secondary | ICD-10-CM | POA: Diagnosis not present

## 2015-06-19 DIAGNOSIS — R29898 Other symptoms and signs involving the musculoskeletal system: Secondary | ICD-10-CM | POA: Diagnosis not present
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
## 2015-06-20 ENCOUNTER — Ambulatory Visit: Payer: 59 | Admitting: Physical Therapy

## 2015-06-20 DIAGNOSIS — M25612 Stiffness of left shoulder, not elsewhere classified: Secondary | ICD-10-CM | POA: Diagnosis not present

## 2015-06-20 DIAGNOSIS — R293 Abnormal posture: Secondary | ICD-10-CM

## 2015-06-20 DIAGNOSIS — M542 Cervicalgia: Secondary | ICD-10-CM

## 2015-06-20 DIAGNOSIS — R29898 Other symptoms and signs involving the musculoskeletal system: Secondary | ICD-10-CM | POA: Diagnosis not present

## 2015-06-20 DIAGNOSIS — M6281 Muscle weakness (generalized): Secondary | ICD-10-CM
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
## 2015-06-25 ENCOUNTER — Encounter: Payer: Self-pay | Admitting: Physical Therapy

## 2015-06-25 ENCOUNTER — Ambulatory Visit: Payer: 59 | Attending: Family Medicine | Admitting: Physical Therapy

## 2015-06-25 DIAGNOSIS — R293 Abnormal posture: Secondary | ICD-10-CM

## 2015-06-25 DIAGNOSIS — M542 Cervicalgia: Secondary | ICD-10-CM | POA: Diagnosis not present

## 2015-06-25 DIAGNOSIS — M6281 Muscle weakness (generalized): Secondary | ICD-10-CM
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
## 2015-06-27 ENCOUNTER — Encounter: Payer: Self-pay | Admitting: Physical Therapy

## 2015-06-27 ENCOUNTER — Ambulatory Visit: Payer: 59 | Admitting: Physical Therapy

## 2015-06-27 DIAGNOSIS — R293 Abnormal posture: Secondary | ICD-10-CM | POA: Diagnosis not present

## 2015-06-27 DIAGNOSIS — M542 Cervicalgia: Secondary | ICD-10-CM

## 2015-06-27 DIAGNOSIS — M6281 Muscle weakness (generalized): Secondary | ICD-10-CM
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
## 2015-06-28 DIAGNOSIS — M542 Cervicalgia: Secondary | ICD-10-CM | POA: Diagnosis not present

## 2015-06-28 DIAGNOSIS — M25519 Pain in unspecified shoulder: Secondary | ICD-10-CM | POA: Diagnosis not present

## 2015-06-28 DIAGNOSIS — R030 Elevated blood-pressure reading, without diagnosis of hypertension: Secondary | ICD-10-CM | POA: Diagnosis not present

## 2015-07-11 ENCOUNTER — Encounter: Payer: 59 | Attending: Physical Medicine & Rehabilitation | Admitting: Physical Medicine & Rehabilitation

## 2015-07-11 ENCOUNTER — Encounter: Payer: Self-pay | Admitting: Physical Medicine & Rehabilitation

## 2015-07-11 VITALS — BP 119/69 | HR 64

## 2015-07-11 DIAGNOSIS — S12600A Unspecified displaced fracture of seventh cervical vertebra, initial encounter for closed fracture: Secondary | ICD-10-CM | POA: Diagnosis not present

## 2015-07-11 DIAGNOSIS — G243 Spasmodic torticollis: Secondary | ICD-10-CM

## 2015-07-11 DIAGNOSIS — H469 Unspecified optic neuritis: Secondary | ICD-10-CM | POA: Diagnosis not present

## 2015-07-11 DIAGNOSIS — R4189 Other symptoms and signs involving cognitive functions and awareness: Secondary | ICD-10-CM | POA: Diagnosis not present

## 2015-07-11 DIAGNOSIS — S069X4S Unspecified intracranial injury with loss of consciousness 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, sequela: Secondary | ICD-10-CM | POA: Diagnosis not present

## 2015-07-11 DIAGNOSIS — R51 Headache: Secondary | ICD-10-CM | POA: Insufficient documentation

## 2015-07-11 DIAGNOSIS — S04011S Injury of optic nerve, right eye, sequela: Secondary | ICD-10-CM | POA: Insufficient documentation

## 2015-07-11 DIAGNOSIS — R531 Weakness: Secondary | ICD-10-CM | POA: Insufficient documentation

## 2015-07-11 DIAGNOSIS — F419 Anxiety disorder, unspecified: Secondary | ICD-10-CM | POA: Diagnosis not present

## 2015-07-11 DIAGNOSIS — R1314 Dysphagia, pharyngoesophageal phase: Secondary | ICD-10-CM | POA: Diagnosis not present

## 2015-07-11 DIAGNOSIS — K649 Unspecified hemorrhoids: Secondary | ICD-10-CM | POA: Diagnosis not present

## 2015-07-11 DIAGNOSIS — S069X0S Unspecified intracranial injury without loss of consciousness, sequela: Secondary | ICD-10-CM | POA: Insufficient documentation

## 2015-07-11 DIAGNOSIS — X58XXXS Exposure to other specified factors, sequela: Secondary | ICD-10-CM | POA: Diagnosis not present

## 2015-07-11 DIAGNOSIS — G549 Nerve root and plexus disorder, unspecified: Secondary | ICD-10-CM | POA: Diagnosis not present

## 2015-07-11 DIAGNOSIS — G249 Dystonia, unspecified: Secondary | ICD-10-CM | POA: Diagnosis not present

## 2015-07-11 DIAGNOSIS — M810 Age-related osteoporosis without current pathological fracture: Secondary | ICD-10-CM | POA: Diagnosis not present

## 2015-07-11 DIAGNOSIS — Z9889 Other specified postprocedural states: Secondary | ICD-10-CM | POA: Diagnosis not present

## 2015-07-11 NOTE — Patient Instructions (Signed)
PLEASE CALL ME WITH ANY PROBLEMS OR QUESTIONS ([phone])  
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
## 2015-08-15 DIAGNOSIS — L723 Sebaceous cyst: Secondary | ICD-10-CM | POA: Diagnosis not present

## 2015-08-16 DIAGNOSIS — L723 Sebaceous cyst: Secondary | ICD-10-CM | POA: Diagnosis not present

## 2015-08-22 DIAGNOSIS — G243 Spasmodic torticollis: Secondary | ICD-10-CM | POA: Diagnosis not present

## 2015-08-22 DIAGNOSIS — M542 Cervicalgia: Secondary | ICD-10-CM | POA: Diagnosis not present

## 2015-08-22 DIAGNOSIS — L723 Sebaceous cyst: Secondary | ICD-10-CM | POA: Diagnosis not present

## 2015-08-27 DIAGNOSIS — M542 Cervicalgia: Secondary | ICD-10-CM | POA: Diagnosis not present

## 2015-08-27 DIAGNOSIS — G243 Spasmodic torticollis: Secondary | ICD-10-CM | POA: Diagnosis not present

## 2015-08-30 DIAGNOSIS — G243 Spasmodic torticollis: Secondary | ICD-10-CM | POA: Diagnosis not present

## 2015-08-30 DIAGNOSIS — M542 Cervicalgia: Secondary | ICD-10-CM | POA: Diagnosis not present

## 2015-09-04 DIAGNOSIS — M542 Cervicalgia: Secondary | ICD-10-CM | POA: Diagnosis not present

## 2015-09-04 DIAGNOSIS — G243 Spasmodic torticollis: Secondary | ICD-10-CM | POA: Diagnosis not present

## 2015-09-06 DIAGNOSIS — G243 Spasmodic torticollis: Secondary | ICD-10-CM | POA: Diagnosis not present

## 2015-09-06 DIAGNOSIS — M542 Cervicalgia: Secondary | ICD-10-CM | POA: Diagnosis not present

## 2015-09-11 DIAGNOSIS — M542 Cervicalgia: Secondary | ICD-10-CM | POA: Diagnosis not present

## 2015-09-11 DIAGNOSIS — G243 Spasmodic torticollis: Secondary | ICD-10-CM | POA: Diagnosis not present

## 2015-10-03 DIAGNOSIS — M542 Cervicalgia: Secondary | ICD-10-CM | POA: Diagnosis not present

## 2015-10-03 DIAGNOSIS — G243 Spasmodic torticollis: Secondary | ICD-10-CM | POA: Diagnosis not present

## 2015-10-04 DIAGNOSIS — G243 Spasmodic torticollis: Secondary | ICD-10-CM | POA: Diagnosis not present

## 2015-10-04 DIAGNOSIS — M542 Cervicalgia: Secondary | ICD-10-CM | POA: Diagnosis not present

## 2015-10-10 DIAGNOSIS — M542 Cervicalgia: Secondary | ICD-10-CM | POA: Diagnosis not present

## 2015-10-10 DIAGNOSIS — G243 Spasmodic torticollis: Secondary | ICD-10-CM | POA: Diagnosis not present

## 2015-11-01 DIAGNOSIS — M542 Cervicalgia: Secondary | ICD-10-CM | POA: Diagnosis not present

## 2015-11-01 DIAGNOSIS — G243 Spasmodic torticollis: Secondary | ICD-10-CM | POA: Diagnosis not present

## 2015-11-05 DIAGNOSIS — M542 Cervicalgia: Secondary | ICD-10-CM | POA: Diagnosis not present

## 2015-11-05 DIAGNOSIS — G243 Spasmodic torticollis: Secondary | ICD-10-CM | POA: Diagnosis not present

## 2015-11-07 DIAGNOSIS — M542 Cervicalgia: Secondary | ICD-10-CM | POA: Diagnosis not present

## 2015-11-07 DIAGNOSIS — G243 Spasmodic torticollis: Secondary | ICD-10-CM | POA: Diagnosis not present

## 2015-11-13 DIAGNOSIS — G243 Spasmodic torticollis: Secondary | ICD-10-CM | POA: Diagnosis not present

## 2015-11-13 DIAGNOSIS — M542 Cervicalgia: Secondary | ICD-10-CM | POA: Diagnosis not present

## 2015-11-19 DIAGNOSIS — M542 Cervicalgia: Secondary | ICD-10-CM | POA: Diagnosis not present

## 2015-11-19 DIAGNOSIS — G243 Spasmodic torticollis: Secondary | ICD-10-CM | POA: Diagnosis not present

## 2015-12-10 DIAGNOSIS — M542 Cervicalgia: Secondary | ICD-10-CM | POA: Diagnosis not present

## 2015-12-10 DIAGNOSIS — G243 Spasmodic torticollis: Secondary | ICD-10-CM | POA: Diagnosis not present

## 2015-12-18 ENCOUNTER — Ambulatory Visit: Payer: No Typology Code available for payment source | Admitting: Physical Medicine & Rehabilitation

## 2015-12-18 DIAGNOSIS — M542 Cervicalgia: Secondary | ICD-10-CM | POA: Diagnosis not present

## 2015-12-18 DIAGNOSIS — G243 Spasmodic torticollis: Secondary | ICD-10-CM | POA: Diagnosis not present

## 2015-12-24 DIAGNOSIS — G243 Spasmodic torticollis: Secondary | ICD-10-CM | POA: Diagnosis not present

## 2015-12-24 DIAGNOSIS — M542 Cervicalgia: Secondary | ICD-10-CM | POA: Diagnosis not present

## 2015-12-31 DIAGNOSIS — Z0000 Encounter for general adult medical examination without abnormal findings: Secondary | ICD-10-CM | POA: Diagnosis not present

## 2015-12-31 DIAGNOSIS — Z136 Encounter for screening for cardiovascular disorders: Secondary | ICD-10-CM | POA: Diagnosis not present

## 2015-12-31 DIAGNOSIS — E559 Vitamin D deficiency, unspecified: Secondary | ICD-10-CM | POA: Diagnosis not present

## 2016-01-08 DIAGNOSIS — Z0000 Encounter for general adult medical examination without abnormal findings: Secondary | ICD-10-CM | POA: Diagnosis not present

## 2016-01-08 DIAGNOSIS — Z1211 Encounter for screening for malignant neoplasm of colon: Secondary | ICD-10-CM | POA: Diagnosis not present

## 2016-01-08 DIAGNOSIS — Z6820 Body mass index (BMI) 20.0-20.9, adult: Secondary | ICD-10-CM | POA: Diagnosis not present

## 2016-01-22 DIAGNOSIS — H524 Presbyopia: Secondary | ICD-10-CM | POA: Diagnosis not present

## 2016-01-22 DIAGNOSIS — G243 Spasmodic torticollis: Secondary | ICD-10-CM | POA: Diagnosis not present

## 2016-01-22 DIAGNOSIS — M542 Cervicalgia: Secondary | ICD-10-CM | POA: Diagnosis not present

## 2016-01-29 DIAGNOSIS — M542 Cervicalgia: Secondary | ICD-10-CM | POA: Diagnosis not present

## 2016-01-29 DIAGNOSIS — G243 Spasmodic torticollis: Secondary | ICD-10-CM | POA: Diagnosis not present

## 2016-02-01 DIAGNOSIS — G243 Spasmodic torticollis: Secondary | ICD-10-CM | POA: Diagnosis not present

## 2016-02-01 DIAGNOSIS — M542 Cervicalgia: Secondary | ICD-10-CM | POA: Diagnosis not present

## 2016-02-05 DIAGNOSIS — G243 Spasmodic torticollis: Secondary | ICD-10-CM | POA: Diagnosis not present

## 2016-02-05 DIAGNOSIS — M542 Cervicalgia: Secondary | ICD-10-CM | POA: Diagnosis not present

## 2016-02-25 DIAGNOSIS — G243 Spasmodic torticollis: Secondary | ICD-10-CM | POA: Diagnosis not present

## 2016-02-25 DIAGNOSIS — M542 Cervicalgia: Secondary | ICD-10-CM | POA: Diagnosis not present

## 2016-03-03 DIAGNOSIS — M542 Cervicalgia: Secondary | ICD-10-CM | POA: Diagnosis not present

## 2016-03-03 DIAGNOSIS — G243 Spasmodic torticollis: Secondary | ICD-10-CM | POA: Diagnosis not present

## 2016-03-06 DIAGNOSIS — M542 Cervicalgia: Secondary | ICD-10-CM | POA: Diagnosis not present

## 2016-03-06 DIAGNOSIS — G243 Spasmodic torticollis: Secondary | ICD-10-CM | POA: Diagnosis not present

## 2016-03-11 DIAGNOSIS — G243 Spasmodic torticollis: Secondary | ICD-10-CM | POA: Diagnosis not present

## 2016-03-11 DIAGNOSIS — M542 Cervicalgia: Secondary | ICD-10-CM | POA: Diagnosis not present

## 2016-03-19 DIAGNOSIS — G243 Spasmodic torticollis: Secondary | ICD-10-CM | POA: Diagnosis not present

## 2016-03-19 DIAGNOSIS — M542 Cervicalgia: Secondary | ICD-10-CM | POA: Diagnosis not present

## 2016-03-28 DIAGNOSIS — M542 Cervicalgia: Secondary | ICD-10-CM | POA: Diagnosis not present

## 2016-03-28 DIAGNOSIS — G243 Spasmodic torticollis: Secondary | ICD-10-CM | POA: Diagnosis not present

## 2016-04-01 DIAGNOSIS — M542 Cervicalgia: Secondary | ICD-10-CM | POA: Diagnosis not present

## 2016-04-01 DIAGNOSIS — G243 Spasmodic torticollis: Secondary | ICD-10-CM | POA: Diagnosis not present

## 2016-04-15 DIAGNOSIS — M542 Cervicalgia: Secondary | ICD-10-CM | POA: Diagnosis not present

## 2016-04-15 DIAGNOSIS — G243 Spasmodic torticollis: Secondary | ICD-10-CM | POA: Diagnosis not present

## 2016-04-21 DIAGNOSIS — G243 Spasmodic torticollis: Secondary | ICD-10-CM | POA: Diagnosis not present

## 2016-04-21 DIAGNOSIS — M542 Cervicalgia: Secondary | ICD-10-CM | POA: Diagnosis not present

## 2016-04-30 DIAGNOSIS — M542 Cervicalgia: Secondary | ICD-10-CM | POA: Diagnosis not present

## 2016-04-30 DIAGNOSIS — G243 Spasmodic torticollis: Secondary | ICD-10-CM | POA: Diagnosis not present

## 2016-05-20 DIAGNOSIS — J069 Acute upper respiratory infection, unspecified: Secondary | ICD-10-CM | POA: Diagnosis not present

## 2016-05-20 DIAGNOSIS — J329 Chronic sinusitis, unspecified: Secondary | ICD-10-CM | POA: Diagnosis not present

## 2016-05-30 DIAGNOSIS — G243 Spasmodic torticollis: Secondary | ICD-10-CM | POA: Diagnosis not present

## 2016-05-30 DIAGNOSIS — M542 Cervicalgia: Secondary | ICD-10-CM | POA: Diagnosis not present

## 2016-06-18 DIAGNOSIS — E559 Vitamin D deficiency, unspecified: Secondary | ICD-10-CM | POA: Diagnosis not present

## 2016-07-14 DIAGNOSIS — Z6820 Body mass index (BMI) 20.0-20.9, adult: Secondary | ICD-10-CM | POA: Diagnosis not present

## 2016-07-14 DIAGNOSIS — L719 Rosacea, unspecified: Secondary | ICD-10-CM | POA: Diagnosis not present

## 2016-07-14 DIAGNOSIS — M542 Cervicalgia: Secondary | ICD-10-CM | POA: Diagnosis not present

## 2016-07-14 DIAGNOSIS — E559 Vitamin D deficiency, unspecified: Secondary | ICD-10-CM | POA: Diagnosis not present

## 2016-08-13 DIAGNOSIS — J020 Streptococcal pharyngitis: Secondary | ICD-10-CM | POA: Diagnosis not present

## 2016-08-13 DIAGNOSIS — R1313 Dysphagia, pharyngeal phase: Secondary | ICD-10-CM | POA: Diagnosis not present
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
## 2016-10-23 IMAGING — CR DG ABD PORTABLE 1V
1 series · 1 of 1 positions shown · non-contrast
Comparison: CT abdomen and pelvis 06/27/2014

CLINICAL DATA: Orogastric tube placement.

EXAM:
PORTABLE ABDOMEN - 1 VIEW

[AP]
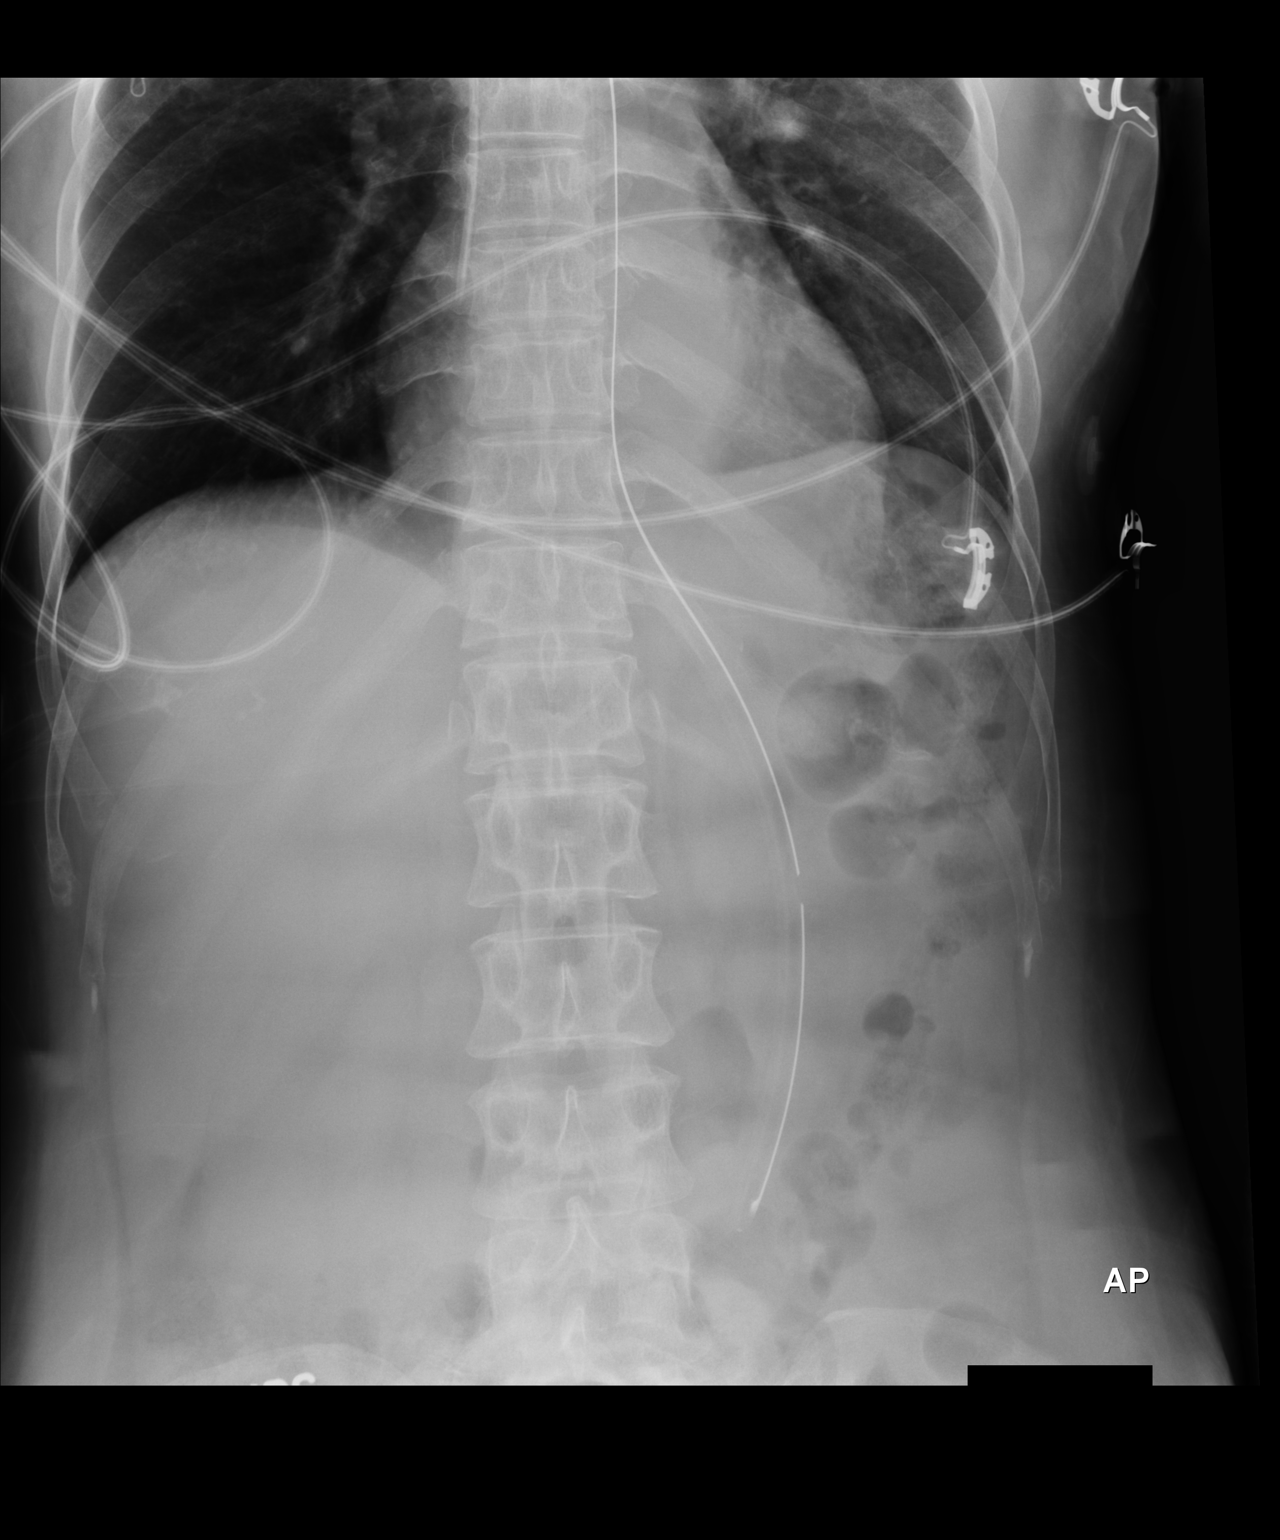

[1 of 1 positions shown; findings below may reference images not displayed]

FINDINGS: Enteric tube terminates in the central abdomen left of midline,
likely in the gastric body. No dilated loops of bowel are seen. Lung
bases are more fully evaluated on concurrent chest radiograph.
IMPRESSION: Enteric tube likely in the gastric body.

## 2017-01-21 DIAGNOSIS — H40013 Open angle with borderline findings, low risk, bilateral: Secondary | ICD-10-CM | POA: Diagnosis not present

## 2017-01-21 DIAGNOSIS — H4720 Unspecified optic atrophy: Secondary | ICD-10-CM | POA: Diagnosis not present

## 2017-01-21 DIAGNOSIS — H1132 Conjunctival hemorrhage, left eye: Secondary | ICD-10-CM | POA: Diagnosis not present

## 2017-01-21 DIAGNOSIS — H47011 Ischemic optic neuropathy, right eye: Secondary | ICD-10-CM | POA: Diagnosis not present

## 2017-01-21 DIAGNOSIS — H2513 Age-related nuclear cataract, bilateral: Secondary | ICD-10-CM | POA: Diagnosis not present

## 2017-02-09 DIAGNOSIS — Z0000 Encounter for general adult medical examination without abnormal findings: Secondary | ICD-10-CM | POA: Diagnosis not present

## 2017-02-11 ENCOUNTER — Other Ambulatory Visit: Payer: Self-pay | Admitting: Family Medicine

## 2017-02-11 DIAGNOSIS — Z1211 Encounter for screening for malignant neoplasm of colon: Secondary | ICD-10-CM | POA: Diagnosis not present

## 2017-02-11 DIAGNOSIS — Z0000 Encounter for general adult medical examination without abnormal findings: Secondary | ICD-10-CM | POA: Diagnosis not present

## 2017-02-11 DIAGNOSIS — Z1231 Encounter for screening mammogram for malignant neoplasm of breast: Secondary | ICD-10-CM

## 2017-02-11 DIAGNOSIS — Z23 Encounter for immunization: Secondary | ICD-10-CM | POA: Diagnosis not present

## 2017-02-11 DIAGNOSIS — Z6820 Body mass index (BMI) 20.0-20.9, adult: Secondary | ICD-10-CM | POA: Diagnosis not present

## 2017-02-11 DIAGNOSIS — E559 Vitamin D deficiency, unspecified: Secondary | ICD-10-CM | POA: Diagnosis not present

## 2017-03-02 ENCOUNTER — Ambulatory Visit
Admission: RE | Admit: 2017-03-02 | Discharge: 2017-03-02 | Disposition: A | Discharge status: Home / Self Care | Payer: 59 | Source: Ambulatory Visit | Attending: Family Medicine | Admitting: Family Medicine

## 2017-03-02 DIAGNOSIS — Z1231 Encounter for screening mammogram for malignant neoplasm of breast: Secondary | ICD-10-CM

## 2017-03-11 DIAGNOSIS — Z23 Encounter for immunization: Secondary | ICD-10-CM | POA: Diagnosis not present

## 2017-03-11 DIAGNOSIS — Q438 Other specified congenital malformations of intestine: Secondary | ICD-10-CM | POA: Diagnosis not present

## 2017-03-11 DIAGNOSIS — K921 Melena: Secondary | ICD-10-CM | POA: Diagnosis not present

## 2017-03-11 DIAGNOSIS — K648 Other hemorrhoids: Secondary | ICD-10-CM | POA: Diagnosis not present

## 2017-03-11 DIAGNOSIS — Z283 Underimmunization status: Secondary | ICD-10-CM | POA: Diagnosis not present

## 2017-03-11 DIAGNOSIS — K602 Anal fissure, unspecified: Secondary | ICD-10-CM | POA: Diagnosis not present

## 2017-04-08 DIAGNOSIS — K648 Other hemorrhoids: Secondary | ICD-10-CM | POA: Diagnosis not present

## 2017-04-08 DIAGNOSIS — K5900 Constipation, unspecified: Secondary | ICD-10-CM | POA: Diagnosis not present

## 2017-04-08 DIAGNOSIS — K644 Residual hemorrhoidal skin tags: Secondary | ICD-10-CM | POA: Diagnosis not present

## 2017-04-08 DIAGNOSIS — Z6820 Body mass index (BMI) 20.0-20.9, adult: Secondary | ICD-10-CM | POA: Diagnosis not present

## 2017-04-21 DIAGNOSIS — H47011 Ischemic optic neuropathy, right eye: Secondary | ICD-10-CM | POA: Diagnosis not present

## 2017-04-21 DIAGNOSIS — H04123 Dry eye syndrome of bilateral lacrimal glands: Secondary | ICD-10-CM | POA: Diagnosis not present

## 2017-04-21 DIAGNOSIS — H401122 Primary open-angle glaucoma, left eye, moderate stage: Secondary | ICD-10-CM | POA: Diagnosis not present

## 2017-04-21 DIAGNOSIS — H4720 Unspecified optic atrophy: Secondary | ICD-10-CM | POA: Diagnosis not present

## 2017-04-21 DIAGNOSIS — H2513 Age-related nuclear cataract, bilateral: Secondary | ICD-10-CM | POA: Diagnosis not present

## 2017-04-21 DIAGNOSIS — H40011 Open angle with borderline findings, low risk, right eye: Secondary | ICD-10-CM | POA: Diagnosis not present

## 2017-05-19 ENCOUNTER — Telehealth: Payer: Self-pay | Admitting: Gastroenterology

## 2017-05-19 NOTE — Telephone Encounter (Signed)
That is OK with me.  thanks 

## 2017-05-19 NOTE — Telephone Encounter (Signed)
Left message for patient to return my call.

## 2017-05-20 DIAGNOSIS — K5900 Constipation, unspecified: Secondary | ICD-10-CM | POA: Diagnosis not present

## 2017-05-20 DIAGNOSIS — Q438 Other specified congenital malformations of intestine: Secondary | ICD-10-CM | POA: Diagnosis not present

## 2017-05-20 DIAGNOSIS — K648 Other hemorrhoids: Secondary | ICD-10-CM | POA: Diagnosis not present

## 2017-05-20 DIAGNOSIS — K644 Residual hemorrhoidal skin tags: Secondary | ICD-10-CM | POA: Diagnosis not present

## 2017-06-01 ENCOUNTER — Encounter: Payer: Self-pay | Admitting: Gastroenterology

## 2017-06-09 DIAGNOSIS — H524 Presbyopia: Secondary | ICD-10-CM | POA: Diagnosis not present

## 2017-07-29 DIAGNOSIS — H2513 Age-related nuclear cataract, bilateral: Secondary | ICD-10-CM | POA: Diagnosis not present

## 2017-07-29 DIAGNOSIS — H4720 Unspecified optic atrophy: Secondary | ICD-10-CM | POA: Diagnosis not present

## 2017-07-29 DIAGNOSIS — H40011 Open angle with borderline findings, low risk, right eye: Secondary | ICD-10-CM | POA: Diagnosis not present

## 2017-07-29 DIAGNOSIS — H47011 Ischemic optic neuropathy, right eye: Secondary | ICD-10-CM | POA: Diagnosis not present

## 2017-07-29 DIAGNOSIS — H04123 Dry eye syndrome of bilateral lacrimal glands: Secondary | ICD-10-CM | POA: Diagnosis not present

## 2017-08-10 DIAGNOSIS — E559 Vitamin D deficiency, unspecified: Secondary | ICD-10-CM | POA: Diagnosis not present

## 2017-08-12 DIAGNOSIS — L719 Rosacea, unspecified: Secondary | ICD-10-CM | POA: Diagnosis not present

## 2017-08-12 DIAGNOSIS — M8580 Other specified disorders of bone density and structure, unspecified site: Secondary | ICD-10-CM | POA: Diagnosis not present

## 2017-08-12 DIAGNOSIS — K644 Residual hemorrhoidal skin tags: Secondary | ICD-10-CM | POA: Diagnosis not present

## 2017-08-12 DIAGNOSIS — Z23 Encounter for immunization: Secondary | ICD-10-CM | POA: Diagnosis not present

## 2017-08-12 DIAGNOSIS — Z6820 Body mass index (BMI) 20.0-20.9, adult: Secondary | ICD-10-CM | POA: Diagnosis not present

## 2017-08-12 DIAGNOSIS — K5900 Constipation, unspecified: Secondary | ICD-10-CM | POA: Diagnosis not present

## 2017-09-17 DIAGNOSIS — H47011 Ischemic optic neuropathy, right eye: Secondary | ICD-10-CM | POA: Diagnosis not present

## 2017-09-17 DIAGNOSIS — H2511 Age-related nuclear cataract, right eye: Secondary | ICD-10-CM | POA: Diagnosis not present

## 2017-09-17 DIAGNOSIS — H40001 Preglaucoma, unspecified, right eye: Secondary | ICD-10-CM | POA: Diagnosis not present

## 2017-09-17 DIAGNOSIS — H538 Other visual disturbances: Secondary | ICD-10-CM | POA: Diagnosis not present

## 2017-09-17 DIAGNOSIS — H5340 Unspecified visual field defects: Secondary | ICD-10-CM | POA: Diagnosis not present

## 2017-10-20 DIAGNOSIS — Z23 Encounter for immunization: Secondary | ICD-10-CM | POA: Diagnosis not present

## 2018-01-29 ENCOUNTER — Other Ambulatory Visit: Payer: Self-pay | Admitting: Family Medicine

## 2018-01-29 DIAGNOSIS — Z1231 Encounter for screening mammogram for malignant neoplasm of breast: Secondary | ICD-10-CM

## 2018-03-10 ENCOUNTER — Ambulatory Visit
Admission: RE | Admit: 2018-03-10 | Discharge: 2018-03-10 | Disposition: A | Discharge status: Home / Self Care | Payer: BLUE CROSS/BLUE SHIELD | Source: Ambulatory Visit | Attending: Family Medicine | Admitting: Family Medicine

## 2018-03-10 DIAGNOSIS — Z1231 Encounter for screening mammogram for malignant neoplasm of breast: Secondary | ICD-10-CM

## 2018-09-01 ENCOUNTER — Encounter: Payer: Self-pay | Admitting: Gastroenterology

## 2018-09-08 ENCOUNTER — Telehealth: Payer: Self-pay | Admitting: *Deleted

## 2018-09-08 NOTE — Telephone Encounter (Signed)
OV scheduled for 10/04/18 @ 2:40 p.m. with pt. And husband.

## 2018-09-08 NOTE — Telephone Encounter (Signed)
Dr. Ardis Hughs do you want a OV or can she be a direct for hospital ?

## 2018-09-08 NOTE — Telephone Encounter (Signed)
Tiffany Stevens,  Unfortunately she is a difficult intubation and her procedure will need to done at the hospital.  Thanks,  [name]

## 2018-09-08 NOTE — Telephone Encounter (Signed)
OV first, thanks
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
## 2018-09-15 ENCOUNTER — Encounter: Payer: BLUE CROSS/BLUE SHIELD | Admitting: Gastroenterology

## 2018-09-29 ENCOUNTER — Encounter: Payer: BLUE CROSS/BLUE SHIELD | Admitting: Gastroenterology

## 2018-10-04 ENCOUNTER — Encounter: Payer: Self-pay | Admitting: Gastroenterology

## 2018-10-04 ENCOUNTER — Ambulatory Visit (INDEPENDENT_AMBULATORY_CARE_PROVIDER_SITE_OTHER): Admitting: Gastroenterology

## 2018-10-04 VITALS — BP 120/80 | HR 72 | Temp 98.6°F | Ht 63.0 in | Wt 119.5 lb

## 2018-10-04 DIAGNOSIS — Z1211 Encounter for screening for malignant neoplasm of colon: Secondary | ICD-10-CM

## 2018-10-04 NOTE — Patient Instructions (Signed)
Your provider has ordered Cologuard testing as an option for colon cancer screening. This is performed by [employer] and may be out of network with your insurance. PRIOR to completing the test, it is YOUR responsibility to contact your insurance about covered benefits for this test. Your out of pocket expense could be anywhere from $0.00 to $649.00.   When you call to check coverage with your insurer, please provide the following information:   -The ONLY provider of Cologuard is [place] code for Cologuard is [phone].  [employer] NPI # [id number]  -[employer] Tax ID # [id number]   We have already sent your demographic and insurance information to [employer] (phone number [phone]) and they should contact you within the next week regarding your test. If you have not heard from them within the next week, please call our office at [phone].    Thank you for entrusting me with your care and choosing [facility].  Dr Ardis Hughs
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
## 2018-10-18 ENCOUNTER — Telehealth: Payer: Self-pay | Admitting: Gastroenterology

## 2018-10-18 NOTE — Telephone Encounter (Signed)
The pt has been advised that the results have not returned and as soon as we have the results and it has been reviewed someone will call. The pt has been advised of the information and verbalized understanding.

## 2018-10-18 NOTE — Telephone Encounter (Signed)
Pt's husband called regarding results of Cologuard.

## 2018-10-19 ENCOUNTER — Telehealth: Payer: Self-pay | Admitting: Gastroenterology

## 2018-10-19 NOTE — Telephone Encounter (Signed)
Message was sent via My Chart responding to the pt message sent this morning.

## 2018-10-20 ENCOUNTER — Other Ambulatory Visit: Payer: Self-pay

## 2018-10-20 LAB — COLOGUARD: Cologuard: NEGATIVE

## 2018-11-15 ENCOUNTER — Encounter (INDEPENDENT_AMBULATORY_CARE_PROVIDER_SITE_OTHER): Payer: Self-pay

## 2019-01-11 ENCOUNTER — Ambulatory Visit: Payer: Medicare Other | Attending: Internal Medicine

## 2019-01-11 DIAGNOSIS — Z20822 Contact with and (suspected) exposure to covid-19: Secondary | ICD-10-CM

## 2019-01-13 LAB — NOVEL CORONAVIRUS, NAA: SARS-CoV-2, NAA: NOT DETECTED

## 2019-01-17 ENCOUNTER — Other Ambulatory Visit: Payer: TRICARE For Life (TFL)

## 2019-04-25 ENCOUNTER — Other Ambulatory Visit: Payer: Self-pay | Admitting: Family Medicine

## 2019-04-25 DIAGNOSIS — E2839 Other primary ovarian failure: Secondary | ICD-10-CM

## 2019-04-26 ENCOUNTER — Ambulatory Visit (INDEPENDENT_AMBULATORY_CARE_PROVIDER_SITE_OTHER): Payer: Medicare Other | Admitting: Otolaryngology

## 2019-04-26 ENCOUNTER — Encounter (INDEPENDENT_AMBULATORY_CARE_PROVIDER_SITE_OTHER): Payer: Self-pay | Admitting: Otolaryngology

## 2019-04-26 ENCOUNTER — Other Ambulatory Visit: Payer: Self-pay

## 2019-04-26 VITALS — Temp 97.7°F

## 2019-04-26 DIAGNOSIS — H6122 Impacted cerumen, left ear: Secondary | ICD-10-CM | POA: Diagnosis not present

## 2019-04-26 NOTE — Progress Notes (Signed)
HPI: Tiffany Stevens is a 64 y.o. female who presents for evaluation of problems with the left ear.  She was recently seen by her medical physician who tried to remove wax from the left ear but caused some pain.  She subsequently had the right ear irrigated out.  But she still hears some clicking sound and a sensation of something in the left ear..  Past Medical History:  Diagnosis Date  . Constipation   . Hemorrhoids   . Osteoporosis   . Seasonal allergies    Past Surgical History:  Procedure Laterality Date  . ANTERIOR CERVICAL DECOMP/DISCECTOMY FUSION N/A 06/27/2014   Procedure: ANTERIOR CERVICAL DECOMPRESSION/DISCECTOMY FUSION CERVICAL SIX-SEVEN;  Surgeon: Leeroy Cha, MD;  Location: [place] [facility];  Service: Neurosurgery;  Laterality: N/A;  . CLOSED REDUCTION NASAL FRACTURE N/A 07/04/2014   Procedure: CLOSED REDUCTION NASAL FRACTURE;  Surgeon: Rozetta Nunnery, MD;  Location: [place];  Service: ENT;  Laterality: N/A;  . HEMORRHOID SURGERY  06/04/10   with rectopexy.  . TRACHEOSTOMY TUBE PLACEMENT N/A 07/04/2014   Procedure: TRACHEOSTOMY;  Surgeon: Rozetta Nunnery, MD;  Location: [place];  Service: ENT;  Laterality: N/A;   Social History   Socioeconomic History  . Marital status: Unknown    Spouse name: Not on file  . Number of children: Not on file  . Years of education: Not on file  . Highest education level: Not on file  Occupational History  . Not on file  Tobacco Use  . Smoking status: Never Smoker  . Smokeless tobacco: Never Used  Substance and Sexual Activity  . Alcohol use: Not on file  . Drug use: Not on file  . Sexual activity: Not on file  Other Topics Concern  . Not on file  Social History Narrative  . Not on file   Social Determinants of Health   Financial Resource Strain:   . Difficulty of Paying Living Expenses:   Food Insecurity:   . Worried About Charity fundraiser in the Last Year:   . Arboriculturist in the Last Year:   Transportation Needs:    . Film/video editor (Medical):   Marland Kitchen Lack of Transportation (Non-Medical):   Physical Activity:   . Days of Exercise per Week:   . Minutes of Exercise per Session:   Stress:   . Feeling of Stress :   Social Connections:   . Frequency of Communication with Friends and Family:   . Frequency of Social Gatherings with Friends and Family:   . Attends Religious Services:   . Active Member of Clubs or Organizations:   . Attends Archivist Meetings:   Marland Kitchen Marital Status:    Family History  Problem Relation Age of Onset  . Cancer Mother        colon   No Known Allergies Prior to Admission medications   Medication Sig Start Date End Date Taking? Authorizing Provider  Ascorbic Acid (VITAMIN C) 1000 MG tablet Take 1,000 mg by mouth daily.   Yes [provider]  Cholecalciferol (VITAMIN D HIGH POTENCY [place]) Take 5,000 Units by mouth daily.   Yes [provider]     Positive ROS: Otherwise negative  All other systems have been reviewed and were otherwise negative with the exception of those mentioned in the HPI and as above.  Physical Exam: Constitutional: Alert, well-appearing, no acute distress Ears: External ears without lesions or tenderness. Ear canals right ear canal and right TM are  clear.  Left ear canal reveals some cerumen down adjacent to the left TM that was removed with suction and forceps.  She has a small abrasion of the ear canal with dried blood but no signs of infection.. Nasal: External nose without lesions. Clear nasal passages Oral: Oropharynx clear. Neck: No palpable adenopathy or masses Respiratory: Breathing comfortably  Skin: No facial/neck lesions or rash noted.  Cerumen impaction removal  Date/Time: 04/26/2019 5:37 PM Performed by: Drema Halon, MD Authorized by: Drema Halon, MD   Consent:    Consent obtained:  Verbal   Consent given by:  Patient   Risks discussed:  Pain and bleeding Procedure details:     Location:  L ear   Procedure type: suction and forceps   Post-procedure details:    Inspection:  TM intact and canal normal   Hearing quality:  Improved   Patient tolerance of procedure:  Tolerated well, no immediate complications Comments:     Both TMs are clear.    Assessment: Left cerumen buildup  Plan: This was cleaned in the office. She will follow-up as needed.  Narda Bonds, MD

## 2019-04-27 ENCOUNTER — Other Ambulatory Visit: Payer: Self-pay | Admitting: Family Medicine

## 2019-04-27 DIAGNOSIS — Z1231 Encounter for screening mammogram for malignant neoplasm of breast: Secondary | ICD-10-CM

## 2019-07-27 ENCOUNTER — Other Ambulatory Visit: Payer: Self-pay

## 2019-07-27 ENCOUNTER — Ambulatory Visit
Admission: RE | Admit: 2019-07-27 | Discharge: 2019-07-27 | Disposition: A | Discharge status: Home / Self Care | Payer: Medicare Other | Source: Ambulatory Visit | Attending: Family Medicine | Admitting: Family Medicine

## 2019-07-27 DIAGNOSIS — Z1231 Encounter for screening mammogram for malignant neoplasm of breast: Secondary | ICD-10-CM

## 2019-07-27 DIAGNOSIS — E2839 Other primary ovarian failure: Secondary | ICD-10-CM

## 2019-09-14 ENCOUNTER — Ambulatory Visit (INDEPENDENT_AMBULATORY_CARE_PROVIDER_SITE_OTHER): Payer: TRICARE For Life (TFL) | Admitting: Otolaryngology

## 2019-09-19 ENCOUNTER — Encounter (INDEPENDENT_AMBULATORY_CARE_PROVIDER_SITE_OTHER): Payer: Self-pay | Admitting: Otolaryngology

## 2019-09-19 ENCOUNTER — Other Ambulatory Visit: Payer: Self-pay

## 2019-09-19 ENCOUNTER — Ambulatory Visit (INDEPENDENT_AMBULATORY_CARE_PROVIDER_SITE_OTHER): Payer: Medicare Other | Admitting: Otolaryngology

## 2019-09-19 VITALS — Temp 96.8°F

## 2019-09-19 DIAGNOSIS — H608X2 Other otitis externa, left ear: Secondary | ICD-10-CM | POA: Diagnosis not present

## 2019-09-19 NOTE — Progress Notes (Signed)
HPI: Tiffany Stevens is a 64 y.o. female who returns today for evaluation of itching and flaking in the left ear predominantly.  She is having no hearing problems.  No drainage from ears..  Past Medical History:  Diagnosis Date  . Constipation   . Hemorrhoids   . Osteoporosis   . Seasonal allergies    Past Surgical History:  Procedure Laterality Date  . ANTERIOR CERVICAL DECOMP/DISCECTOMY FUSION N/A 06/27/2014   Procedure: ANTERIOR CERVICAL DECOMPRESSION/DISCECTOMY FUSION CERVICAL SIX-SEVEN;  Surgeon: Hilda Lias, MD;  Location: [facility];  Service: Neurosurgery;  Laterality: N/A;  . CLOSED REDUCTION NASAL FRACTURE N/A 07/04/2014   Procedure: CLOSED REDUCTION NASAL FRACTURE;  Surgeon: Drema Halon, MD;  Location: [facility];  Service: ENT;  Laterality: N/A;  . HEMORRHOID SURGERY  06/04/10   with rectopexy.  . TRACHEOSTOMY TUBE PLACEMENT N/A 07/04/2014   Procedure: TRACHEOSTOMY;  Surgeon: Drema Halon, MD;  Location: [facility] OR;  Service: ENT;  Laterality: N/A;   Social History   Socioeconomic History  . Marital status: Unknown    Spouse name: Not on file  . Number of children: Not on file  . Years of education: Not on file  . Highest education level: Not on file  Occupational History  . Not on file  Tobacco Use  . Smoking status: Never Smoker  . Smokeless tobacco: Never Used  Substance and Sexual Activity  . Alcohol use: Not on file  . Drug use: Not on file  . Sexual activity: Not on file  Other Topics Concern  . Not on file  Social History Narrative  . Not on file   Social Determinants of Health   Financial Resource Strain:   . Difficulty of Paying Living Expenses: Not on file  Food Insecurity:   . Worried About Programme researcher, broadcasting/film/video in the Last Year: Not on file  . Ran Out of Food in the Last Year: Not on file  Transportation Needs:   . Lack of Transportation (Medical): Not on file  . Lack of Transportation (Non-Medical): Not on file  Physical Activity:   .  Days of Exercise per Week: Not on file  . Minutes of Exercise per Session: Not on file  Stress:   . Feeling of Stress : Not on file  Social Connections:   . Frequency of Communication with Friends and Family: Not on file  . Frequency of Social Gatherings with Friends and Family: Not on file  . Attends Religious Services: Not on file  . Active Member of Clubs or Organizations: Not on file  . Attends Banker Meetings: Not on file  . Marital Status: Not on file   Family History  Problem Relation Age of Onset  . Cancer Mother        colon   No Known Allergies Prior to Admission medications   Medication Sig Start Date End Date Taking? Authorizing Provider  Ascorbic Acid (VITAMIN C) 1000 MG tablet Take 1,000 mg by mouth daily.   Yes [provider]  Cholecalciferol (VITAMIN D HIGH POTENCY [place]) Take 5,000 Units by mouth daily.   Yes [provider]     Positive ROS: Otherwise negative  All other systems have been reviewed and were otherwise negative with the exception of those mentioned in the HPI and as above.  Physical Exam: Constitutional: Alert, well-appearing, no acute distress Ears: External ears without lesions or tenderness. Ear canals are clear bilaterally with no significant wax buildup.  She did  have some slight flaking and dryness of the lateral portion of the ear canal the left side.  The TMs are clear bilaterally.  Hearing screening with a tuning forks revealed good hearing in both ears. Nasal: External nose without lesions. Clear nasal passages Oral: Lips and gums without lesions. Tongue and palate mucosa without lesions. Posterior oropharynx clear. Neck: No palpable adenopathy or masses Respiratory: Breathing comfortably  Skin: No facial/neck lesions or rash noted.  Procedures  Assessment: Itching and crusting of the lateral skin of the left ear canal.  Plan: Recommended use of skin lotion or oral in the ear. Prescribed Diprolene  0.05% cream or lotion to use as needed itching.   Narda Bonds, MD

## 2020-03-27 ENCOUNTER — Other Ambulatory Visit (HOSPITAL_COMMUNITY): Payer: Self-pay | Admitting: Family Medicine

## 2020-05-15 ENCOUNTER — Other Ambulatory Visit (HOSPITAL_COMMUNITY): Payer: Self-pay

## 2020-05-15 MED ORDER — CLINDAMYCIN PHOSPHATE 1 % EX GEL
CUTANEOUS | 11 refills | Status: DC
  Filled 2020-05-15 – 2020-05-17 (×2): qty 30, 30d supply, fill #0
  Filled 2020-06-25: qty 30, 30d supply, fill #1

## 2020-05-16 ENCOUNTER — Other Ambulatory Visit: Payer: Self-pay | Admitting: Family Medicine

## 2020-05-16 DIAGNOSIS — Z1231 Encounter for screening mammogram for malignant neoplasm of breast: Secondary | ICD-10-CM

## 2020-05-17 ENCOUNTER — Other Ambulatory Visit (HOSPITAL_COMMUNITY): Payer: Self-pay

## 2020-06-25 ENCOUNTER — Other Ambulatory Visit (HOSPITAL_COMMUNITY): Payer: Self-pay

## 2020-08-01 ENCOUNTER — Other Ambulatory Visit (HOSPITAL_COMMUNITY): Payer: Self-pay

## 2020-08-01 MED ORDER — METRONIDAZOLE 1 % EX GEL
CUTANEOUS | 11 refills | Status: DC
  Filled 2020-08-01 (×2): qty 60, 30d supply, fill #0

## 2020-08-02 ENCOUNTER — Other Ambulatory Visit (HOSPITAL_COMMUNITY): Payer: Self-pay

## 2020-08-03 ENCOUNTER — Other Ambulatory Visit (HOSPITAL_COMMUNITY): Payer: Self-pay

## 2020-08-18 ENCOUNTER — Ambulatory Visit
Admission: RE | Admit: 2020-08-18 | Discharge: 2020-08-18 | Disposition: A | Discharge status: Home / Self Care | Payer: Medicare Other | Source: Ambulatory Visit | Attending: Family Medicine | Admitting: Family Medicine

## 2020-08-18 ENCOUNTER — Other Ambulatory Visit: Payer: Self-pay

## 2020-08-18 DIAGNOSIS — Z1231 Encounter for screening mammogram for malignant neoplasm of breast: Secondary | ICD-10-CM

## 2020-08-22 ENCOUNTER — Ambulatory Visit: Payer: Medicare Other

## 2021-05-16 ENCOUNTER — Other Ambulatory Visit: Payer: Self-pay | Admitting: Family Medicine

## 2021-05-16 DIAGNOSIS — Z1231 Encounter for screening mammogram for malignant neoplasm of breast: Secondary | ICD-10-CM

## 2021-05-16 DIAGNOSIS — M810 Age-related osteoporosis without current pathological fracture: Secondary | ICD-10-CM

## 2021-06-03 ENCOUNTER — Telehealth: Payer: Self-pay

## 2021-06-03 ENCOUNTER — Ambulatory Visit (INDEPENDENT_AMBULATORY_CARE_PROVIDER_SITE_OTHER): Payer: Medicare Other | Admitting: Gastroenterology

## 2021-06-03 ENCOUNTER — Encounter: Payer: Self-pay | Admitting: Gastroenterology

## 2021-06-03 VITALS — BP 98/60 | HR 67 | Ht 60.0 in | Wt 111.0 lb

## 2021-06-03 DIAGNOSIS — Z1211 Encounter for screening for malignant neoplasm of colon: Secondary | ICD-10-CM | POA: Diagnosis not present

## 2021-06-03 DIAGNOSIS — T884XXA Failed or difficult intubation, initial encounter: Secondary | ICD-10-CM

## 2021-06-03 NOTE — Progress Notes (Signed)
I agree with the above note, plan 

## 2021-06-03 NOTE — Telephone Encounter (Signed)
Waiting for August schedule ?

## 2021-06-03 NOTE — Patient Instructions (Addendum)
If you are age 68 or older, your body mass index should be between 23-30. Your Body mass index is 21.68 kg/m?Marland Kitchen If this is out of the aforementioned range listed, please consider follow up with your Primary Care Provider. ?________________________________________________________ ? ?[facility] providers would like to encourage you to use [facility] to communicate with providers for non-urgent requests or questions.  Due to long hold times on the telephone, sending your provider a message by [facility] may be a faster and more efficient way to get a response.  Please allow 48 business hours for a response.  Please remember that this is for non-urgent requests.  ?_______________________________________________________ ? ? ?Dr. Christella Hartigan will review your visit today and you will be scheduled for a next available hospital opening. Expect a phone call from Dr. Larae Grooms nurse. ? ?Follow up as needed. ? ?Thank you for entrusting me with your care and choosing [facility]. ? ?Doug Sou, PA-C ? ?** Patient will need 2 day bowel prep ?
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
## 2021-06-03 NOTE — Telephone Encounter (Signed)
-----   Message from Rachael Fee, MD sent at 06/03/2021  2:57 PM EDT ----- ?Ok to wait till my next avalble Thursday [facility] spot (whenever that may be) for routine colon cancer screening. ? ?Thanks ? ?----- Message ----- ?From: Leta Baptist, PA-C ?Sent: 06/03/2021  11:31 AM EDT ?To: Rachael Fee, MD, Loretha Stapler, RN, # ? ?Please advise if you have anything available that could be used for her colonoscopy at the hospital.  Otherwise do we have your approval to put her on in August when that next schedule becomes available? ? ?Thank you! ? ?[name] ? ? ?

## 2021-06-06 ENCOUNTER — Other Ambulatory Visit: Payer: Self-pay

## 2021-06-06 DIAGNOSIS — Z1211 Encounter for screening for malignant neoplasm of colon: Secondary | ICD-10-CM

## 2021-06-06 MED ORDER — PEG 3350-KCL-NA BICARB-NACL 420 G PO SOLR: 4000.0000 mL | Freq: Once | ORAL | 0 refills | Status: AC

## 2021-06-06 NOTE — Telephone Encounter (Signed)
Colon scheduled for 7/20 at 1030 am at [facility] with DJ  Left message on machine to call back

## 2021-06-06 NOTE — Telephone Encounter (Signed)
Colon scheduled, pt and pt husband instructed and medications reviewed.  Patient instructions mailed to home and sent to My Chart.  Patient to call with any questions or concerns.

## 2021-07-02 ENCOUNTER — Other Ambulatory Visit: Payer: Self-pay | Admitting: Family Medicine

## 2021-07-02 DIAGNOSIS — M810 Age-related osteoporosis without current pathological fracture: Secondary | ICD-10-CM

## 2021-08-01 ENCOUNTER — Encounter (HOSPITAL_COMMUNITY): Payer: Self-pay | Admitting: Gastroenterology

## 2021-08-08 ENCOUNTER — Ambulatory Visit (HOSPITAL_BASED_OUTPATIENT_CLINIC_OR_DEPARTMENT_OTHER): Payer: Medicare Other | Admitting: Anesthesiology

## 2021-08-08 ENCOUNTER — Ambulatory Visit (HOSPITAL_COMMUNITY): Payer: Medicare Other | Admitting: Anesthesiology

## 2021-08-08 ENCOUNTER — Other Ambulatory Visit: Payer: Self-pay

## 2021-08-08 ENCOUNTER — Encounter (HOSPITAL_COMMUNITY)
Admission: RE | Disposition: A | Discharge status: Home / Self Care | Payer: Self-pay | Source: Home / Self Care | Attending: Gastroenterology

## 2021-08-08 ENCOUNTER — Encounter (HOSPITAL_COMMUNITY): Payer: Self-pay | Admitting: Gastroenterology

## 2021-08-08 ENCOUNTER — Ambulatory Visit (HOSPITAL_COMMUNITY)
Admission: RE | Admit: 2021-08-08 | Discharge: 2021-08-08 | Disposition: A | Discharge status: Home / Self Care | Payer: Medicare Other | Attending: Gastroenterology | Admitting: Gastroenterology

## 2021-08-08 DIAGNOSIS — Z1211 Encounter for screening for malignant neoplasm of colon: Secondary | ICD-10-CM | POA: Diagnosis not present

## 2021-08-08 DIAGNOSIS — F419 Anxiety disorder, unspecified: Secondary | ICD-10-CM

## 2021-08-08 DIAGNOSIS — K648 Other hemorrhoids: Secondary | ICD-10-CM | POA: Diagnosis not present

## 2021-08-08 HISTORY — PX: COLONOSCOPY WITH PROPOFOL: SHX5780

## 2021-08-08 SURGERY — COLONOSCOPY WITH PROPOFOL
Anesthesia: Monitor Anesthesia Care

## 2021-08-08 MED ORDER — PROPOFOL 500 MG/50ML IV EMUL
INTRAVENOUS | Status: DC | PRN
  Administered 2021-08-08: 20 mg via INTRAVENOUS
  Administered 2021-08-08: 130 ug/kg/min via INTRAVENOUS

## 2021-08-08 MED ORDER — SODIUM CHLORIDE 0.9 % IV SOLN: INTRAVENOUS | Status: DC

## 2021-08-08 MED ORDER — EPHEDRINE SULFATE-NACL 50-0.9 MG/10ML-% IV SOSY
PREFILLED_SYRINGE | INTRAVENOUS | Status: DC | PRN
  Administered 2021-08-08: 5 mg via INTRAVENOUS

## 2021-08-08 MED ORDER — LACTATED RINGERS IV SOLN
INTRAVENOUS | Status: AC | PRN
  Administered 2021-08-08: 1000 mL via INTRAVENOUS

## 2021-08-08 SURGICAL SUPPLY — 22 items
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
## 2021-08-08 NOTE — Anesthesia Postprocedure Evaluation (Signed)
Anesthesia Post Note  Patient: Tiffany Stevens  Procedure(s) Performed: COLONOSCOPY WITH PROPOFOL     Patient location during evaluation: PACU Anesthesia Type: MAC Level of consciousness: awake and alert Pain management: pain level controlled Vital Signs Assessment: post-procedure vital signs reviewed and stable Respiratory status: spontaneous breathing, nonlabored ventilation, respiratory function stable and patient connected to nasal cannula oxygen Cardiovascular status: stable and blood pressure returned to baseline Postop Assessment: no apparent nausea or vomiting Anesthetic complications: no   No notable events documented.  Last Vitals:  Vitals:   08/08/21 0954 08/08/21 1005  BP: 93/60 112/63  Pulse: 74 70  Resp: (!) 22 17  Temp:    SpO2: 100% 98%    Last Pain:  Vitals:   08/08/21 1005  TempSrc:   PainSc: 0-No pain                 Effie Berkshire

## 2021-08-08 NOTE — Anesthesia Preprocedure Evaluation (Signed)
Anesthesia Evaluation  Patient identified by MRN, date of birth, ID band Patient awake    Reviewed: Allergy & Precautions, NPO status , Patient's Chart, lab work & pertinent test results  History of Anesthesia Complications (+) DIFFICULT AIRWAY and history of anesthetic complications  Airway Mallampati: II  TM Distance: >3 FB Neck ROM: Full    Dental  (+) Teeth Intact, Dental Advisory Given   Pulmonary neg pulmonary ROS,    breath sounds clear to auscultation       Cardiovascular negative cardio ROS   Rhythm:Regular Rate:Normal     Neuro/Psych Anxiety negative neurological ROS     GI/Hepatic negative GI ROS, Neg liver ROS,   Endo/Other  negative endocrine ROS  Renal/GU negative Renal ROS     Musculoskeletal negative musculoskeletal ROS (+)   Abdominal Normal abdominal exam  (+)   Peds  Hematology negative hematology ROS (+)   Anesthesia Other Findings   Reproductive/Obstetrics                             Anesthesia Physical Anesthesia Plan  ASA: 2  Anesthesia Plan: MAC   Post-op Pain Management:    Induction:   PONV Risk Score and Plan: 0 and Propofol infusion  Airway Management Planned:   Additional Equipment: None  Intra-op Plan:   Post-operative Plan:   Informed Consent: I have reviewed the patients History and Physical, chart, labs and discussed the procedure including the risks, benefits and alternatives for the proposed anesthesia with the patient or authorized representative who has indicated his/her understanding and acceptance.       Plan Discussed with: CRNA  Anesthesia Plan Comments:         Anesthesia Quick Evaluation

## 2021-08-08 NOTE — Transfer of Care (Signed)
Immediate Anesthesia Transfer of Care Note  Patient: Tiffany Stevens  Procedure(s) Performed: COLONOSCOPY WITH PROPOFOL  Patient Location: PACU and Endoscopy Unit  Anesthesia Type:MAC  Level of Consciousness: awake, alert , oriented and patient cooperative  Airway & Oxygen Therapy: Patient Spontanous Breathing and Patient connected to face mask oxygen  Post-op Assessment: Report given to RN, Post -op Vital signs reviewed and stable and Patient moving all extremities  Post vital signs: Reviewed and stable  Last Vitals:  Vitals Value Taken Time  BP 89/46 08/08/21 0944  Temp    Pulse 62 08/08/21 0944  Resp 17 08/08/21 0944  SpO2 100 % 08/08/21 0944    Last Pain:  Vitals:   08/08/21 0823  TempSrc: Oral  PainSc: 0-No pain         Complications: No notable events documented.

## 2021-08-08 NOTE — Discharge Instructions (Signed)
YOU HAD AN ENDOSCOPIC PROCEDURE TODAY: Refer to the procedure report and other information in the discharge instructions given to you for any specific questions about what was found during the examination. If this information does not answer your questions, please call [facility] office at [phone] to clarify.   YOU SHOULD EXPECT: Some feelings of bloating in the abdomen. Passage of more gas than usual. Walking can help get rid of the air that was put into your GI tract during the procedure and reduce the bloating.   DIET: Your first meal following the procedure should be a light meal and then it is ok to progress to your normal diet. A half-sandwich or bowl of soup is an example of a good first meal. Heavy or fried foods are harder to digest and may make you feel nauseous or bloated. Drink plenty of fluids but you should avoid alcoholic beverages for 24 hours.   ACTIVITY: Your care partner should take you home directly after the procedure. You should plan to take it easy, moving slowly for the rest of the day. You can resume normal activity the day after the procedure however YOU SHOULD NOT DRIVE, use power tools, machinery or perform tasks that involve climbing or major physical exertion for 24 hours (because of the sedation medicines used during the test).   SYMPTOMS TO REPORT IMMEDIATELY: A gastroenterologist can be reached at any hour. Please call [phone]  for any of the following symptoms:  Following lower endoscopy (colonoscopy, flexible sigmoidoscopy) Excessive amounts of blood in the stool  Significant tenderness, worsening of abdominal pains  Swelling of the abdomen that is new, acute  Fever of 100 or higher    FOLLOW UP:  If any biopsies were taken you will be contacted by phone or by letter within the next 1-3 weeks. Call [phone]  if you have not heard about the biopsies in 3 weeks.  Please also call with any specific questions about appointments or follow up tests. 

## 2021-08-08 NOTE — Op Note (Signed)
[facility] Patient Name: Tiffany Stevens Procedure Date: 08/08/2021 MRN: [id number] Attending MD: Rachael Fee , MD Date of Birth: 04-Oct-1955 CSN: [id number] Age: 66 Admit Type: Outpatient Procedure:                Colonoscopy Indications:              Screening for colorectal malignant neoplasm Providers:                Rachael Fee, MD, Estella Husk RN, RN, Beryle Beams, Technician, Lauree Chandler Armistead, CRNA Referring MD:              Medicines:                Monitored Anesthesia Care Complications:            No immediate complications. Estimated blood loss:                            None. Estimated Blood Loss:     Estimated blood loss: none. Procedure:                Pre-Anesthesia Assessment:                           - Prior to the procedure, a History and Physical                            was performed, and patient medications and                            allergies were reviewed. The patient's tolerance of                            previous anesthesia was also reviewed. The risks                            and benefits of the procedure and the sedation                            options and risks were discussed with the patient.                            All questions were answered, and informed consent                            was obtained. Prior Anticoagulants: The patient has                            taken no previous anticoagulant or antiplatelet                            agents. ASA Grade Assessment: II - A patient with  mild systemic disease. After reviewing the risks                            and benefits, the patient was deemed in                            satisfactory condition to undergo the procedure.                           After obtaining informed consent, the colonoscope                            was passed under direct vision. Throughout the                             procedure, the patient's blood pressure, pulse, and                            oxygen saturations were monitored continuously. The                            CF-HQ190L ([id number]) Olympus colonoscope was                            introduced through the anus and advanced to the the                            cecum, identified by appendiceal orifice and                            ileocecal valve. The colonoscopy was performed                            without difficulty. The patient tolerated the                            procedure well. The quality of the bowel                            preparation was good. The ileocecal valve,                            appendiceal orifice, and rectum were photographed. Scope In: 9:26:47 AM Scope Out: 9:38:21 AM Scope Withdrawal Time: 0 hours 6 minutes 49 seconds  Total Procedure Duration: 0 hours 11 minutes 34 seconds  Findings:      Internal hemorrhoids were found. The hemorrhoids were small.      The exam was otherwise without abnormality on direct and retroflexion       views. Impression:               - Internal hemorrhoids.                           - The examination was otherwise normal on direct  and retroflexion views.                           - No polyps or cancers. Moderate Sedation:      Not Applicable - Patient had care per Anesthesia. Recommendation:           - Patient has a contact number available for                            emergencies. The signs and symptoms of potential                            delayed complications were discussed with the                            patient. Return to normal activities tomorrow.                            Written discharge instructions were provided to the                            patient.                           - Resume previous diet.                           - Continue present medications.                           - You do not need any further colon  cancer                            screening tests (including stool testing). These                            types of tests generally stop around age 52-80. Procedure Code(s):        --- Professional ---                           [phone], Colonoscopy, flexible; diagnostic, including                            collection of specimen(s) by brushing or washing,                            when performed (separate procedure) Diagnosis Code(s):        --- Professional ---                           Z12.11, Encounter for screening for malignant                            neoplasm of colon  K64.8, Other hemorrhoids CPT copyright 2019 American Medical Association. All rights reserved. The codes documented in this report are preliminary and upon coder review may  be revised to meet current compliance requirements. Rachael Fee, MD 08/08/2021 9:50:26 AM This report has been signed electronically. Number of Addenda: 0

## 2021-08-12 ENCOUNTER — Encounter (HOSPITAL_COMMUNITY): Payer: Self-pay | Admitting: Gastroenterology

## 2021-11-07 ENCOUNTER — Other Ambulatory Visit: Payer: Medicare Other

## 2021-11-07 ENCOUNTER — Ambulatory Visit: Payer: Medicare Other

## 2021-12-17 ENCOUNTER — Ambulatory Visit
Admission: RE | Admit: 2021-12-17 | Discharge: 2021-12-17 | Disposition: A | Discharge status: Home / Self Care | Payer: Medicare Other | Source: Ambulatory Visit | Attending: Family Medicine | Admitting: Family Medicine

## 2021-12-17 ENCOUNTER — Ambulatory Visit: Payer: Medicare Other

## 2021-12-17 DIAGNOSIS — Z1231 Encounter for screening mammogram for malignant neoplasm of breast: Secondary | ICD-10-CM

## 2021-12-17 DIAGNOSIS — M810 Age-related osteoporosis without current pathological fracture: Secondary | ICD-10-CM

## 2022-01-28 ENCOUNTER — Ambulatory Visit: Payer: Medicare Other | Admitting: Family Medicine
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
## 2022-01-29 ENCOUNTER — Ambulatory Visit (INDEPENDENT_AMBULATORY_CARE_PROVIDER_SITE_OTHER): Payer: Medicare Other

## 2022-01-29 ENCOUNTER — Ambulatory Visit (INDEPENDENT_AMBULATORY_CARE_PROVIDER_SITE_OTHER): Payer: Medicare Other | Admitting: Family Medicine

## 2022-01-29 VITALS — BP 122/80 | HR 63 | Ht 63.0 in | Wt 112.0 lb

## 2022-01-29 DIAGNOSIS — R29898 Other symptoms and signs involving the musculoskeletal system: Secondary | ICD-10-CM | POA: Diagnosis not present

## 2022-01-29 DIAGNOSIS — M542 Cervicalgia: Secondary | ICD-10-CM

## 2022-01-29 DIAGNOSIS — G8929 Other chronic pain: Secondary | ICD-10-CM

## 2022-01-29 DIAGNOSIS — R202 Paresthesia of skin: Secondary | ICD-10-CM | POA: Diagnosis not present

## 2022-01-29 NOTE — Patient Instructions (Addendum)
Thank you for coming in today.   Please get an Xray today before you leave.  I've referred you to Physical Therapy.  Let us know if you don't hear from them in one week.   Recheck in 4 weeks.   Keep me upated.  If worse we will move faster.

## 2022-01-30 LAB — VITAMIN D 25 HYDROXY (VIT D DEFICIENCY, FRACTURES): Vit D, 25-Hydroxy: 52.5

## 2022-01-30 NOTE — Progress Notes (Signed)
Cervical spine x-ray shows good appearing fusion at C6-7 with a little bit of arthritis above it at C5-6.

## 2022-02-04 ENCOUNTER — Ambulatory Visit: Payer: Medicare Other | Attending: Family Medicine | Admitting: Physical Therapy

## 2022-02-04 DIAGNOSIS — M542 Cervicalgia: Secondary | ICD-10-CM | POA: Diagnosis present

## 2022-02-04 DIAGNOSIS — G8929 Other chronic pain: Secondary | ICD-10-CM | POA: Insufficient documentation

## 2022-02-04 DIAGNOSIS — R202 Paresthesia of skin: Secondary | ICD-10-CM | POA: Insufficient documentation

## 2022-02-04 DIAGNOSIS — R29898 Other symptoms and signs involving the musculoskeletal system: Secondary | ICD-10-CM | POA: Insufficient documentation

## 2022-02-04 DIAGNOSIS — R293 Abnormal posture: Secondary | ICD-10-CM | POA: Diagnosis present

## 2022-02-04 DIAGNOSIS — M6281 Muscle weakness (generalized): Secondary | ICD-10-CM | POA: Diagnosis present
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
## 2022-02-04 NOTE — Patient Instructions (Signed)
Access Code: [email or web address] URL: [email or web address] Date: 02/04/2022 Prepared by: Voncille Lo  Exercises - Supine Deep Neck Flexor Training  - 2-3 x daily - 7 x weekly - 10 reps - 3 hold - Seated Cervical Retraction Protraction AROM  - 2-3 x daily - 7 x weekly - 1 sets - 10 reps - Seated Gentle Upper Trapezius Stretch  - 1 x daily - 7 x weekly - 1 sets - 3 reps - 30 hold - Gentle Levator Scapulae Stretch  - 1 x daily - 7 x weekly - 3 sets - 3 reps - 30 hold - Shoulder External Rotation and Scapular Retraction with Resistance  - 1-2 x daily - 7 x weekly - 3 sets - 10 reps - 3-5 sec hold  Posture Tips DO: - stand tall and erect - keep chin tucked in - keep head and shoulders in alignment - check posture regularly in mirror or large window - pull head back against headrest in car seat;  Change your position often.  Sit with lumbar support. DON'T: - slouch or slump while watching TV or reading - sit, stand or lie in one position  for too long;  Sitting is especially hard on the spine so if you sit at a desk/use the computer, then stand up often!   Copyright  VHI. All rights reserved.  Posture - Standing   Good posture is important. Avoid slouching and forward head thrust. Maintain curve in low back and align ears over shoul- ders, hips over ankles.  Pull your belly button in toward your back bone. Stand with even weight on feet.  Ribs lifted up and chin down.     Copyright  VHI. All rights reserved.  Posture - Sitting   Sit upright, head facing forward. Try using a roll to support lower back. Keep shoulders relaxed, and avoid rounded back. Keep hips level with knees. Avoid crossing legs for long periods. Sit on sit bones and not tail bones. Do not cross legs   Copyright  VHI. All rights reserved.    Voncille Lo, PT, [place] Certified Exercise Expert for the Aging Adult  02/04/22 7:55 AM Phone: [phone] Fax: [phone]
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
## 2022-02-07 ENCOUNTER — Encounter: Payer: Self-pay | Admitting: Physical Therapy

## 2022-02-07 ENCOUNTER — Ambulatory Visit: Payer: Medicare Other | Admitting: Physical Therapy

## 2022-02-07 DIAGNOSIS — M542 Cervicalgia: Secondary | ICD-10-CM | POA: Diagnosis not present

## 2022-02-07 DIAGNOSIS — M6281 Muscle weakness (generalized): Secondary | ICD-10-CM

## 2022-02-07 DIAGNOSIS — R293 Abnormal posture: Secondary | ICD-10-CM
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
## 2022-02-11 ENCOUNTER — Other Ambulatory Visit: Payer: Self-pay

## 2022-02-11 ENCOUNTER — Ambulatory Visit: Payer: Medicare Other | Admitting: Physical Therapy

## 2022-02-11 ENCOUNTER — Encounter: Payer: Self-pay | Admitting: Physical Therapy

## 2022-02-11 DIAGNOSIS — R293 Abnormal posture: Secondary | ICD-10-CM

## 2022-02-11 DIAGNOSIS — M542 Cervicalgia: Secondary | ICD-10-CM

## 2022-02-11 DIAGNOSIS — M6281 Muscle weakness (generalized): Secondary | ICD-10-CM

## 2022-02-11 NOTE — Patient Instructions (Addendum)
WALKING  Walking is a great form of exercise to increase your strength, endurance and overall fitness.  A walking program can help you start slowly and gradually build endurance as you go.  Everyone's ability is different, so each person's starting point will be different.  You do not have to follow them exactly.  The are just samples. You should simply find out what's right for you and stick to that program.   In the beginning, you'll start off walking 2-3 times a day for short distances.  As you get stronger, you'll be walking further at just 1-2 times per day. 7000 to 10000 steps  or basically it is 150 to 300 minute at least 5 days a week ( 30 to 45 min of walking  A. You Can Walk For A Certain Length Of Time Each Day    Walk 5 minutes 3 times per day.  Increase 2 minutes every 2 days (3 times per day).  Work up to 25-30 minutes (1-2 times per day).   Example:   Day 1-2 5 minutes 3 times per day   Day 7-8 12 minutes 2-3 times per day   Day 13-14 25 minutes 1-2 times per day  B. You Can Walk For a Certain Distance Each Day     Distance can be substituted for time.    Example:   3 trips to mailbox (at road)   3 trips to corner of block   3 trips around the block  C. Go to local [employer] and use the track.    Walk for distance ____ around track  Or time __30 to 45 __ minutes  5 x a week minimum  D. Walk __x__ Jog ____ Run ___  Please only do the exercises that your therapist has initialed and dated  Voncille Lo, PT, [facility] Certified Exercise Expert for the Aging Adult  02/11/22 7:57 AM Phone: [phone] Fax: [phone]
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
## 2022-02-14 ENCOUNTER — Encounter: Payer: Self-pay | Admitting: Physical Therapy

## 2022-02-14 ENCOUNTER — Ambulatory Visit: Payer: Medicare Other | Admitting: Physical Therapy

## 2022-02-14 DIAGNOSIS — M542 Cervicalgia: Secondary | ICD-10-CM | POA: Diagnosis not present

## 2022-02-14 DIAGNOSIS — M6281 Muscle weakness (generalized): Secondary | ICD-10-CM

## 2022-02-14 DIAGNOSIS — R293 Abnormal posture: Secondary | ICD-10-CM
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
## 2022-02-18 ENCOUNTER — Encounter: Payer: Self-pay | Admitting: Physical Therapy

## 2022-02-18 ENCOUNTER — Ambulatory Visit: Payer: Medicare Other | Admitting: Physical Therapy

## 2022-02-18 DIAGNOSIS — M6281 Muscle weakness (generalized): Secondary | ICD-10-CM

## 2022-02-18 DIAGNOSIS — M542 Cervicalgia: Secondary | ICD-10-CM | POA: Diagnosis not present

## 2022-02-18 DIAGNOSIS — R293 Abnormal posture: Secondary | ICD-10-CM
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
## 2022-02-20 ENCOUNTER — Other Ambulatory Visit: Payer: Self-pay

## 2022-02-20 ENCOUNTER — Encounter: Payer: Self-pay | Admitting: Physical Therapy

## 2022-02-20 ENCOUNTER — Ambulatory Visit: Payer: Medicare Other | Attending: Family Medicine | Admitting: Physical Therapy

## 2022-02-20 DIAGNOSIS — M542 Cervicalgia: Secondary | ICD-10-CM | POA: Diagnosis present

## 2022-02-20 DIAGNOSIS — R293 Abnormal posture: Secondary | ICD-10-CM | POA: Diagnosis present

## 2022-02-20 DIAGNOSIS — M6281 Muscle weakness (generalized): Secondary | ICD-10-CM
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
## 2022-02-25 ENCOUNTER — Encounter: Payer: Self-pay | Admitting: Family Medicine

## 2022-02-25 ENCOUNTER — Ambulatory Visit (INDEPENDENT_AMBULATORY_CARE_PROVIDER_SITE_OTHER): Payer: Medicare Other | Admitting: Family Medicine

## 2022-02-25 VITALS — BP 110/78 | HR 52 | Ht 63.0 in | Wt 110.8 lb

## 2022-02-25 DIAGNOSIS — M5412 Radiculopathy, cervical region: Secondary | ICD-10-CM | POA: Diagnosis not present

## 2022-02-25 NOTE — Patient Instructions (Signed)
Thank you for coming in today.   Continue PT for 1 more month.   Recheck in 1 month.   If not better we will probably do a CT myelogram or MRI of the neck to look at the pinched nerve.

## 2022-02-26 ENCOUNTER — Ambulatory Visit: Payer: Medicare Other | Admitting: Physical Therapy
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
## 2022-02-27 ENCOUNTER — Encounter: Payer: Self-pay | Admitting: Physical Therapy

## 2022-02-27 ENCOUNTER — Ambulatory Visit: Payer: Medicare Other | Admitting: Physical Therapy

## 2022-02-27 DIAGNOSIS — M6281 Muscle weakness (generalized): Secondary | ICD-10-CM | POA: Diagnosis not present

## 2022-02-27 DIAGNOSIS — M542 Cervicalgia: Secondary | ICD-10-CM

## 2022-02-27 DIAGNOSIS — R293 Abnormal posture: Secondary | ICD-10-CM

## 2022-02-27 NOTE — Patient Instructions (Signed)
Sleeping on Back  Place pillow under knees. A pillow with cervical support and a roll around waist are also helpful. Copyright  VHI. All rights reserved.  Sleeping on Side Place pillow between knees. Use cervical support under neck and a roll around waist as needed. Copyright  VHI. All rights reserved.   Sleeping on Stomach   If this is the only desirable sleeping position, place pillow under lower legs, and under stomach or chest as needed.  Posture - Sitting   Sit upright, head facing forward. Try using a roll to support lower back. Keep shoulders relaxed, and avoid rounded back. Keep hips level with knees. Avoid crossing legs for long periods. Stand to Sit / Sit to Stand   To sit: Bend knees to lower self onto front edge of chair, then scoot back on seat. To stand: Reverse sequence by placing one foot forward, and scoot to front of seat. Use rocking motion to stand up.   Work Height and Reach  Ideal work height is no more than 2 to 4 inches below elbow level when standing, and at elbow level when sitting. Reaching should be limited to arm's length, with elbows slightly bent.  Bending  Bend at hips and knees, not back. Keep feet shoulder-width apart.    Posture - Standing   Good posture is important. Avoid slouching and forward head thrust. Maintain curve in low back and align ears over shoul- ders, hips over ankles.  Alternating Positions   Alternate tasks and change positions frequently to reduce fatigue and muscle tension. Take rest breaks. Computer Work   Position work to Programmer, multimedia. Use proper work and seat height. Keep shoulders back and down, wrists straight, and elbows at right angles. Use chair that provides full back support. Add footrest and lumbar roll as needed.  Getting Into / Out of Car  Lower self onto seat, scoot back, then bring in one leg at a time. Reverse sequence to get out.  Dressing  Lie on back to pull socks or slacks over feet, or sit  and bend leg while keeping back straight.    Housework - Sink  Place one foot on ledge of cabinet under sink when standing at sink for prolonged periods.   Pushing / Pulling  Pushing is preferable to pulling. Keep back in proper alignment, and use leg muscles to do the work.  Deep Squat   Squat and lift with both arms held against upper trunk. Tighten stomach muscles without holding breath. Use smooth movements to avoid jerking.  Avoid Twisting   Avoid twisting or bending back. Pivot around using foot movements, and bend at knees if needed when reaching for articles.  Carrying Luggage   Distribute weight evenly on both sides. Use a cart whenever possible. Do not twist trunk. Move body as a unit.   Lifting Principles Maintain proper posture and head alignment. Slide object as close as possible before lifting. Move obstacles out of the way. Test before lifting; ask for help if too heavy. Tighten stomach muscles without holding breath. Use smooth movements; do not jerk. Use legs to do the work, and pivot with feet. Distribute the work load symmetrically and close to the center of trunk. Push instead of pull whenever possible.   Ask For Help   Ask for help and delegate to others when possible. Coordinate your movements when lifting together, and maintain the low back curve.  Log Roll   Lying on back, bend left knee and place left  arm across chest. Roll all in one movement to the right. Reverse to roll to the left. Always move as one unit. Housework - Sweeping  Use long-handled equipment to avoid stooping.   Housework - Wiping  Position yourself as close as possible to reach work surface. Avoid straining your back.  Laundry - Unloading Wash   To unload small items at bottom of washer, lift leg opposite to arm being used to reach.  [place] close to area to be raked. Use arm movements to do the work. Keep back straight and avoid twisting.      Cart  When reaching into cart with one arm, lift opposite leg to keep back straight.   Getting Into / Out of Bed  Lower self to lie down on one side by raising legs and lowering head at the same time. Use arms to assist moving without twisting. Bend both knees to roll onto back if desired. To sit up, start from lying on side, and use same move-ments in reverse. Housework - Vacuuming  Hold the vacuum with arm held at side. Step back and forth to move it, keeping head up. Avoid twisting.   Laundry - IT consultant so that bending and twisting can be avoided.   Laundry - Unloading Dryer  Squat down to reach into clothes dryer or use a reacher.  Gardening - Weeding / Probation officer or Kneel. Knee pads may be helpful.                    Tiffany Stevens, PT, [place] Certified Exercise Expert for the Aging Adult  02/27/22 8:51 AM Phone: [phone] Fax: [phone]
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
## 2022-03-06 ENCOUNTER — Ambulatory Visit: Payer: Medicare Other | Admitting: Physical Therapy

## 2022-03-06 ENCOUNTER — Other Ambulatory Visit: Payer: Self-pay

## 2022-03-06 ENCOUNTER — Encounter: Payer: Self-pay | Admitting: Physical Therapy

## 2022-03-06 DIAGNOSIS — M542 Cervicalgia: Secondary | ICD-10-CM

## 2022-03-06 DIAGNOSIS — M6281 Muscle weakness (generalized): Secondary | ICD-10-CM | POA: Diagnosis not present

## 2022-03-06 DIAGNOSIS — R293 Abnormal posture: Secondary | ICD-10-CM

## 2022-03-12 ENCOUNTER — Encounter: Payer: Self-pay | Admitting: Physical Therapy

## 2022-03-12 ENCOUNTER — Ambulatory Visit: Payer: Medicare Other | Admitting: Physical Therapy

## 2022-03-12 DIAGNOSIS — M542 Cervicalgia: Secondary | ICD-10-CM

## 2022-03-12 DIAGNOSIS — M6281 Muscle weakness (generalized): Secondary | ICD-10-CM | POA: Diagnosis not present

## 2022-03-12 DIAGNOSIS — R293 Abnormal posture: Secondary | ICD-10-CM
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
## 2022-03-18 ENCOUNTER — Ambulatory Visit: Payer: Medicare Other | Admitting: Physical Therapy

## 2022-03-18 DIAGNOSIS — M542 Cervicalgia: Secondary | ICD-10-CM

## 2022-03-18 DIAGNOSIS — M6281 Muscle weakness (generalized): Secondary | ICD-10-CM

## 2022-03-18 DIAGNOSIS — R293 Abnormal posture: Secondary | ICD-10-CM

## 2022-03-18 NOTE — Patient Instructions (Addendum)
You may purchase a Thera cane on [place] as shown in clinic.     Access Code: [email or web address] URL: [email or web address] Date: 03/18/2022 Prepared by: Voncille Lo  Exercises - Supine Deep Neck Flexor Training  - 2-3 x daily - 7 x weekly - 10 reps - 3 hold - Seated Cervical Retraction Protraction AROM  - 2-3 x daily - 7 x weekly - 1 sets - 10 reps - Seated Gentle Upper Trapezius Stretch  - 1 x daily - 7 x weekly - 1 sets - 3 reps - 30 hold - Gentle Levator Scapulae Stretch  - 1 x daily - 7 x weekly - 3 sets - 3 reps - 30 hold - Shoulder External Rotation and Scapular Retraction with Resistance  - 1-2 x daily - 7 x weekly - 3 sets - 10 reps - 3-5 sec hold - Standing Shoulder Diagonal Horizontal Abduction 60/120 Degrees with Resistance  - 1 x daily - 7 x weekly - 3 sets - 10 reps - Standing Shoulder Horizontal Abduction with Resistance  - 1 x daily - 7 x weekly - 3 sets - 10 reps - Single Arm Overhead Triceps Extension  - 1 x daily - 7 x weekly - 3 sets - 10 reps - Shoulder Overhead Press in Abduction with Dumbbells  - 1 x daily - 7 x weekly - 3 sets - 10 reps - Tricep Push Up on Wall  - 1 x daily - 7 x weekly - 3 sets - 10 reps - Standard Plank  - 1 x daily - 7 x weekly - 1 sets - 2-3 reps - 30 sec to 45 sec hold hold - [employer] With Dumbbells  - 1 x daily - 7 x weekly - 2 sets - 10 reps - Alternating Shoulder Flexion with Resistance  - 1 x daily - 7 x weekly - 3 sets - 10 reps - Scaption with Resistance  - 1 x daily - 7 x weekly - 3 sets - 10 reps Voncille Lo, PT, [place] Certified Exercise Expert for the Aging Adult  03/18/22 8:21 AM Phone: [phone] Fax: [phone]
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
## 2022-04-02 ENCOUNTER — Encounter: Payer: Self-pay | Admitting: Family Medicine

## 2022-04-02 ENCOUNTER — Ambulatory Visit (INDEPENDENT_AMBULATORY_CARE_PROVIDER_SITE_OTHER): Payer: Medicare Other | Admitting: Family Medicine

## 2022-04-02 VITALS — BP 104/70 | HR 65 | Ht 63.0 in | Wt 113.2 lb

## 2022-04-02 DIAGNOSIS — R202 Paresthesia of skin: Secondary | ICD-10-CM

## 2022-04-02 DIAGNOSIS — R29898 Other symptoms and signs involving the musculoskeletal system: Secondary | ICD-10-CM | POA: Diagnosis not present

## 2022-04-02 DIAGNOSIS — M5412 Radiculopathy, cervical region: Secondary | ICD-10-CM | POA: Diagnosis not present

## 2022-04-02 NOTE — Patient Instructions (Addendum)
Thank you for coming in today.   You should hear from MRI scheduling within 1 week. If you do not hear please let me know.    Continue home exercises.   I will get the results to you when I get them back.

## 2022-04-11 ENCOUNTER — Ambulatory Visit
Admission: RE | Admit: 2022-04-11 | Discharge: 2022-04-11 | Disposition: A | Discharge status: Home / Self Care | Payer: Medicare Other | Source: Ambulatory Visit | Attending: Family Medicine | Admitting: Family Medicine

## 2022-04-11 DIAGNOSIS — M5412 Radiculopathy, cervical region: Secondary | ICD-10-CM

## 2022-04-11 DIAGNOSIS — R29898 Other symptoms and signs involving the musculoskeletal system: Secondary | ICD-10-CM

## 2022-04-11 DIAGNOSIS — R202 Paresthesia of skin: Secondary | ICD-10-CM

## 2022-04-21 NOTE — Progress Notes (Signed)
MRI cervical spine shows prior C6-7 fusion.  You do have a little bit of narrowing at C7-T1 on the right that could impact the right C8 nerve root.  You do have some arthritis at C2-3 and C5-6.  Overall looks okay.  If improving with physical therapy we can proceed with watchful waiting.  Next step if needed in the future could be an injection.  Let me know if you need one.

## 2022-08-23 ENCOUNTER — Ambulatory Visit
Admission: RE | Admit: 2022-08-23 | Discharge: 2022-08-23 | Disposition: A | Discharge status: Home / Self Care | Payer: Medicare Other | Source: Ambulatory Visit | Attending: Nurse Practitioner | Admitting: Nurse Practitioner

## 2022-08-23 VITALS — BP 105/67 | HR 78 | Temp 98.2°F | Resp 18

## 2022-08-23 DIAGNOSIS — H0015 Chalazion left lower eyelid: Secondary | ICD-10-CM | POA: Diagnosis not present

## 2022-08-23 MED ORDER — TOBRAMYCIN-DEXAMETHASONE 0.3-0.1 % OP SUSP: 1.0000 [drp] | Freq: Four times a day (QID) | OPHTHALMIC | 0 refills | Status: AC

## 2022-08-23 NOTE — ED Triage Notes (Signed)
Left eye swollen  states has drainage in the morning time.  States has been going on since July 2nd, states when away and came back.  States eye itches and painful at times.  States started with a little yellow bump.

## 2022-08-23 NOTE — Discharge Instructions (Addendum)
Use eyedrops as prescribed.   May apply cool compresses to help with pain or swelling, or warm compresses to help with pain or discomfort. Continue use of Pataday eyedrops. Strict handwashing when applying medication.  Avoid rubbing or manipulating the eyes while symptoms persist. Follow-up with eye doctor as scheduled for next week. Follow-up as needed.
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
## 2022-09-09 ENCOUNTER — Ambulatory Visit: Payer: Medicare Other | Admitting: Internal Medicine

## 2022-09-09 ENCOUNTER — Encounter: Payer: Self-pay | Admitting: Internal Medicine

## 2022-09-09 VITALS — BP 140/94 | HR 69 | Temp 97.9°F | Resp 16 | Ht 63.0 in | Wt 111.0 lb

## 2022-09-09 DIAGNOSIS — R7303 Prediabetes: Secondary | ICD-10-CM | POA: Diagnosis not present

## 2022-09-09 DIAGNOSIS — M810 Age-related osteoporosis without current pathological fracture: Secondary | ICD-10-CM | POA: Diagnosis not present

## 2022-09-09 DIAGNOSIS — Z1159 Encounter for screening for other viral diseases: Secondary | ICD-10-CM | POA: Insufficient documentation

## 2022-09-09 DIAGNOSIS — E785 Hyperlipidemia, unspecified: Secondary | ICD-10-CM | POA: Diagnosis not present

## 2022-09-09 DIAGNOSIS — I10 Essential (primary) hypertension: Secondary | ICD-10-CM | POA: Diagnosis not present

## 2022-09-09 LAB — HEPATIC FUNCTION PANEL
ALT: 16 U/L (ref 0–35)
AST: 25 U/L (ref 0–37)
Albumin: 4.3 g/dL (ref 3.5–5.2)
Alkaline Phosphatase: 40 U/L (ref 39–117)
Bilirubin, Direct: 0.1 mg/dL (ref 0.0–0.3)
Total Bilirubin: 0.8 mg/dL (ref 0.2–1.2)
Total Protein: 7.1 g/dL (ref 6.0–8.3)

## 2022-09-09 LAB — BASIC METABOLIC PANEL
BUN: 10 mg/dL (ref 6–23)
CO2: 30 mEq/L (ref 19–32)
Calcium: 9.2 mg/dL (ref 8.4–10.5)
Chloride: 102 mEq/L (ref 96–112)
Creatinine, Ser: 0.54 mg/dL (ref 0.40–1.20)
GFR: 94.45 mL/min (ref >60.00)
Glucose, Bld: 104 mg/dL — ABNORMAL HIGH (ref 70–99)
Potassium: 4.3 mEq/L (ref 3.5–5.1)
Sodium: 138 mEq/L (ref 135–145)

## 2022-09-09 LAB — LIPID PANEL
Cholesterol: 218 mg/dL — ABNORMAL HIGH (ref 0–200)
HDL: 60.6 mg/dL (ref >39.00)
LDL Cholesterol: 142 mg/dL — ABNORMAL HIGH (ref 0–99)
NonHDL: 157.39
Total CHOL/HDL Ratio: 4
Triglycerides: 77 mg/dL (ref 0.0–149.0)
VLDL: 15.4 mg/dL (ref 0.0–40.0)

## 2022-09-09 LAB — URINALYSIS, ROUTINE W REFLEX MICROSCOPIC
Bilirubin Urine: NEGATIVE
Ketones, ur: NEGATIVE
Leukocytes,Ua: NEGATIVE
Nitrite: NEGATIVE
Specific Gravity, Urine: <=1.005 — AB (ref 1.000–1.030)
Total Protein, Urine: NEGATIVE
Urine Glucose: NEGATIVE
Urobilinogen, UA: 0.2 (ref 0.0–1.0)
WBC, UA: NONE SEEN (ref 0–?)
pH: 6.5 (ref 5.0–8.0)

## 2022-09-09 LAB — CBC WITH DIFFERENTIAL/PLATELET
Basophils Absolute: 0 10*3/uL (ref 0.0–0.1)
Basophils Relative: 0.7 % (ref 0.0–3.0)
Eosinophils Absolute: 0.1 10*3/uL (ref 0.0–0.7)
Eosinophils Relative: 1.3 % (ref 0.0–5.0)
HCT: 38.8 % (ref 36.0–46.0)
Hemoglobin: 12.7 g/dL (ref 12.0–15.0)
Lymphocytes Relative: 31.8 % (ref 12.0–46.0)
Lymphs Abs: 1.5 10*3/uL (ref 0.7–4.0)
MCHC: 32.7 g/dL (ref 30.0–36.0)
MCV: 98.9 fl (ref 78.0–100.0)
Monocytes Absolute: 0.4 10*3/uL (ref 0.1–1.0)
Monocytes Relative: 8.5 % (ref 3.0–12.0)
Neutro Abs: 2.8 10*3/uL (ref 1.4–7.7)
Neutrophils Relative %: 57.7 % (ref 43.0–77.0)
Platelets: 182 10*3/uL (ref 150.0–400.0)
RBC: 3.93 Mil/uL (ref 3.87–5.11)
RDW: 12.8 % (ref 11.5–15.5)
WBC: 4.8 10*3/uL (ref 4.0–10.5)

## 2022-09-09 LAB — HEMOGLOBIN A1C: Hgb A1c MFr Bld: 6 % (ref 4.6–6.5)

## 2022-09-09 LAB — PHOSPHORUS: Phosphorus: 3.7 mg/dL (ref 2.3–4.6)

## 2022-09-09 LAB — TSH: TSH: 1.05 u[IU]/mL (ref 0.35–5.50)

## 2022-09-09 NOTE — Progress Notes (Signed)
Subjective:  Patient ID: [id number], female    DOB: [birth year]  Age: 67 y.o. MRN: [id number]  CC: Hypertension and Hyperlipidemia   HPI Tiffany Stevens presents for establishing ----  She is active and denies chest pain, shortness of breath, diaphoresis, or edema.  History Tiffany Stevens has a past medical history of Constipation, Hemorrhoids, Osteoporosis, and Seasonal allergies.   She has a past surgical history that includes Anterior cervical decomp/discectomy fusion (N/A, 06/27/2014); Tracheostomy tube placement (N/A, 07/04/2014); Closed reduction nasal fracture (N/A, 07/04/2014); Hemorrhoid surgery (06/04/10); and Colonoscopy with propofol (N/A, 08/08/2021).   Her family history includes Cancer in her mother.She reports that she has never smoked. She has never been exposed to tobacco smoke. She has never used smokeless tobacco. She reports that she does not drink alcohol and does not use drugs.  Past Medical History:  Diagnosis Date   Constipation    Hemorrhoids    Osteoporosis    Seasonal allergies    Past Surgical History:  Procedure Laterality Date   ANTERIOR CERVICAL DECOMP/DISCECTOMY FUSION N/A 06/27/2014   Procedure: ANTERIOR CERVICAL DECOMPRESSION/DISCECTOMY FUSION CERVICAL SIX-SEVEN;  Surgeon: Hilda Lias, MD;  Location: [facility];  Service: Neurosurgery;  Laterality: N/A;   CLOSED REDUCTION NASAL FRACTURE N/A 07/04/2014   Procedure: CLOSED REDUCTION NASAL FRACTURE;  Surgeon: Drema Halon, MD;  Location: [facility] OR;  Service: ENT;  Laterality: N/A;   COLONOSCOPY WITH PROPOFOL N/A 08/08/2021   Procedure: COLONOSCOPY WITH PROPOFOL;  Surgeon: Rachael Fee, MD;  Location: [facility];  Service: Gastroenterology;  Laterality: N/A;   HEMORRHOID SURGERY  06/04/10   with rectopexy.   TRACHEOSTOMY TUBE PLACEMENT N/A 07/04/2014   Procedure: TRACHEOSTOMY;  Surgeon: Drema Halon, MD;  Location: [facility] OR;  Service: ENT;  Laterality: N/A;    reports that she has never smoked. She  has never been exposed to tobacco smoke. She has never used smokeless tobacco. She reports that she does not drink alcohol and does not use drugs. family history includes Cancer in her mother. No Known Allergies   Outpatient Medications Prior to Visit  Medication Sig Dispense Refill   alendronate (FOSAMAX) 70 MG tablet Take 70 mg by mouth once a week.     CALCIUM Junnie Take 500 mg by mouth daily.     Cholecalciferol (VITAMIN D3) 125 MCG (5000 UT) CAPS Take by mouth.     vitamin C (ASCORBIC ACID) 500 MG tablet Take 500 mg by mouth daily.     No facility-administered medications prior to visit.    ROS Review of Systems  Constitutional:  Negative for appetite change, fatigue and fever.  HENT: Negative.  Negative for trouble swallowing.   Eyes: Negative.   Respiratory: Negative.  Negative for cough, shortness of breath and wheezing.   Cardiovascular:  Negative for chest pain, palpitations and leg swelling.  Gastrointestinal:  Negative for abdominal pain, constipation, diarrhea, nausea and vomiting.  Genitourinary:  Negative for difficulty urinating.  Musculoskeletal: Negative.  Negative for arthralgias and myalgias.  Skin: Negative.   Neurological: Negative.  Negative for dizziness and weakness.  Hematological:  Negative for adenopathy. Does not bruise/bleed easily.  Psychiatric/Behavioral: Negative.      Objective:  BP (!) 140/94 (BP Location: Left Arm, Patient Position: Sitting, Cuff Size: Normal)   Pulse 69   Temp 97.9 F (36.6 C) (Oral)   Resp 16   Ht 5\' 3"  (1.6 m)   Wt 111 lb (50.3 kg)   LMP  (LMP Unknown) Comment: level  1 trauma  SpO2 95%   BMI 19.66 kg/m   Physical Exam Vitals reviewed.  Constitutional:      Appearance: Normal appearance.  HENT:     Nose: Nose normal.     Mouth/Throat:     Mouth: Mucous membranes are moist.  Eyes:     General: No scleral icterus.    Conjunctiva/sclera: Conjunctivae normal.  Cardiovascular:     Rate and Rhythm: Normal rate and  regular rhythm.     Heart sounds: No murmur heard.    No friction rub. No gallop.     Comments: EKG- NSR, 64 bpm No LVH, Q waves, or ST/T waves  Pulmonary:     Effort: Pulmonary effort is normal.     Breath sounds: No stridor. No wheezing, rhonchi or rales.  Abdominal:     General: Abdomen is flat.     Palpations: There is no mass.     Tenderness: There is no abdominal tenderness. There is no guarding.     Hernia: No hernia is present.  Musculoskeletal:     Cervical back: Neck supple.     Right lower leg: No edema.     Left lower leg: No edema.  Lymphadenopathy:     Cervical: No cervical adenopathy.  Skin:    General: Skin is warm and dry.  Neurological:     General: No focal deficit present.     Mental Status: She is alert. Mental status is at baseline.  Psychiatric:        Mood and Affect: Mood normal.        Behavior: Behavior normal.     Lab Results  Component Value Date   WBC 4.8 09/09/2022   HGB 12.7 09/09/2022   HCT 38.8 09/09/2022   PLT 182.0 09/09/2022   GLUCOSE 104 (H) 09/09/2022   CHOL 218 (H) 09/09/2022   TRIG 77.0 09/09/2022   HDL 60.60 09/09/2022   LDLCALC 142 (H) 09/09/2022   ALT 16 09/09/2022   AST 25 09/09/2022   NA 138 09/09/2022   K 4.3 09/09/2022   CL 102 09/09/2022   CREATININE 0.54 09/09/2022   BUN 10 09/09/2022   CO2 30 09/09/2022   TSH 1.05 09/09/2022   INR 1.02 07/19/2014   HGBA1C 6.0 09/09/2022     Assessment & Plan:   Primary hypertension- EKG is negative for LVH.  Labs are negative for secondary causes or endorgan damage.  She is not willing to start an antihypertensive.  She agrees to come back in 3 months for blood pressure recheck. -     EKG 12-Lead -     Basic metabolic panel; Future -     CBC with Differential/Platelet; Future -     Hepatic function panel; Future -     TSH; Future -     Urinalysis, Routine w reflex microscopic; Future  Prediabetes -     Basic metabolic panel; Future -     Hemoglobin A1c; Future -      TSH; Future  Dyslipidemia, goal LDL below 130- Will start a statin for cardiovascular risk reduction. -     Lipid panel; Future -     Lipoprotein A (LPA); Future -     Hepatic function panel; Future -     Rosuvastatin Calcium; Take 1 tablet (5 mg total) by mouth daily.  Dispense: 90 tablet; Refill: 0  Age-related osteoporosis without current pathological fracture -     Basic metabolic panel; Future -  Phosphorus; Future  Need for hepatitis C screening test -     Hepatitis C antibody; Future      Follow-up: Return in about 3 months (around 12/10/2022).  Sanda Linger, MD

## 2022-09-09 NOTE — Patient Instructions (Signed)
Hypertension, Adult High blood pressure (hypertension) is when the force of blood pumping through the arteries is too strong. The arteries are the blood vessels that carry blood from the heart throughout the body. Hypertension forces the heart to work harder to pump blood and may cause arteries to become narrow or stiff. Untreated or uncontrolled hypertension can lead to a heart attack, heart failure, a stroke, kidney disease, and other problems. A blood pressure reading consists of a higher number over a lower number. Ideally, your blood pressure should be below 120/80. The first ("top") number is called the systolic pressure. It is a measure of the pressure in your arteries as your heart beats. The second ("bottom") number is called the diastolic pressure. It is a measure of the pressure in your arteries as the heart relaxes. What are the causes? The exact cause of this condition is not known. There are some conditions that result in high blood pressure. What increases the risk? Certain factors may make you more likely to develop high blood pressure. Some of these risk factors are under your control, including: Smoking. Not getting enough exercise or physical activity. Being overweight. Having too much fat, sugar, calories, or salt (sodium) in your diet. Drinking too much alcohol. Other risk factors include: Having a personal history of heart disease, diabetes, high cholesterol, or kidney disease. Stress. Having a family history of high blood pressure and high cholesterol. Having obstructive sleep apnea. Age. The risk increases with age. What are the signs or symptoms? High blood pressure may not cause symptoms. Very high blood pressure (hypertensive crisis) may cause: Headache. Fast or irregular heartbeats (palpitations). Shortness of breath. Nosebleed. Nausea and vomiting. Vision changes. Severe chest pain, dizziness, and seizures. How is this diagnosed? This condition is diagnosed by  measuring your blood pressure while you are seated, with your arm resting on a flat surface, your legs uncrossed, and your feet flat on the floor. The cuff of the blood pressure monitor will be placed directly against the skin of your upper arm at the level of your heart. Blood pressure should be measured at least twice using the same arm. Certain conditions can cause a difference in blood pressure between your right and left arms. If you have a high blood pressure reading during one visit or you have normal blood pressure with other risk factors, you may be asked to: Return on a different day to have your blood pressure checked again. Monitor your blood pressure at home for 1 week or longer. If you are diagnosed with hypertension, you may have other blood or imaging tests to help your health care provider understand your overall risk for other conditions. How is this treated? This condition is treated by making healthy lifestyle changes, such as eating healthy foods, exercising more, and reducing your alcohol intake. You may be referred for counseling on a healthy diet and physical activity. Your health care provider may prescribe medicine if lifestyle changes are not enough to get your blood pressure under control and if: Your systolic blood pressure is above 130. Your diastolic blood pressure is above 80. Your personal target blood pressure may vary depending on your medical conditions, your age, and other factors. Follow these instructions at home: Eating and drinking  Eat a diet that is high in fiber and potassium, and low in sodium, added sugar, and fat. An example of this eating plan is called the DASH diet. DASH stands for Dietary Approaches to Stop Hypertension. To eat this way: Eat   plenty of fresh fruits and vegetables. Try to fill one half of your plate at each meal with fruits and vegetables. Eat whole grains, such as whole-wheat pasta, brown rice, or whole-grain bread. Fill about one  fourth of your plate with whole grains. Eat or drink low-fat dairy products, such as skim milk or low-fat yogurt. Avoid fatty cuts of meat, processed or cured meats, and poultry with skin. Fill about one fourth of your plate with lean proteins, such as fish, chicken without skin, beans, eggs, or tofu. Avoid pre-made and processed foods. These tend to be higher in sodium, added sugar, and fat. Reduce your daily sodium intake. Many people with hypertension should eat less than 1,500 mg of sodium a day. Do not drink alcohol if: Your health care provider tells you not to drink. You are pregnant, may be pregnant, or are planning to become pregnant. If you drink alcohol: Limit how much you have to: 0-1 drink a day for women. 0-2 drinks a day for men. Know how much alcohol is in your drink. In the [place] one drink equals one 12 oz bottle of beer (355 mL), one 5 oz glass of wine (148 mL), or one 1 oz glass of hard liquor (44 mL). Lifestyle  Work with your health care provider to maintain a healthy body weight or to lose weight. Ask what an ideal weight is for you. Get at least 30 minutes of exercise that causes your heart to beat faster (aerobic exercise) most days of the week. Activities may include walking, swimming, or biking. Include exercise to strengthen your muscles (resistance exercise), such as Pilates or lifting weights, as part of your weekly exercise routine. Try to do these types of exercises for 30 minutes at least 3 days a week. Do not use any products that contain nicotine or tobacco. These products include cigarettes, chewing tobacco, and vaping devices, such as e-cigarettes. If you need help quitting, ask your health care provider. Monitor your blood pressure at home as told by your health care provider. Keep all follow-up visits. This is important. Medicines Take over-the-counter and prescription medicines only as told by your health care provider. Follow directions carefully. Blood  pressure medicines must be taken as prescribed. Do not skip doses of blood pressure medicine. Doing this puts you at risk for problems and can make the medicine less effective. Ask your health care provider about side effects or reactions to medicines that you should watch for. Contact a health care provider if you: Think you are having a reaction to a medicine you are taking. Have headaches that keep coming back (recurring). Feel dizzy. Have swelling in your ankles. Have trouble with your vision. Get help right away if you: Develop a severe headache or confusion. Have unusual weakness or numbness. Feel faint. Have severe pain in your chest or abdomen. Vomit repeatedly. Have trouble breathing. These symptoms may be an emergency. Get help right away. Call 911. Do not wait to see if the symptoms will go away. Do not drive yourself to the hospital. Summary Hypertension is when the force of blood pumping through your arteries is too strong. If this condition is not controlled, it may put you at risk for serious complications. Your personal target blood pressure may vary depending on your medical conditions, your age, and other factors. For most people, a normal blood pressure is less than 120/80. Hypertension is treated with lifestyle changes, medicines, or a combination of both. Lifestyle changes include losing weight, eating a healthy,   low-sodium diet, exercising more, and limiting alcohol. This information is not intended to replace advice given to you by your health care provider. Make sure you discuss any questions you have with your health care provider. Document Revised: 11/13/2020 Document Reviewed: 11/13/2020 Elsevier Patient Education  2024 [employer]  

## 2022-09-11 MED ORDER — ROSUVASTATIN CALCIUM 5 MG PO TABS: 5.0000 mg | ORAL_TABLET | Freq: Every day | ORAL | 0 refills | Status: DC

## 2022-09-15 LAB — HEPATITIS C ANTIBODY: Hepatitis C Ab: NONREACTIVE

## 2022-09-15 LAB — LIPOPROTEIN A (LPA): Lipoprotein (a): <10 nmol/L (ref <75)

## 2022-12-10 ENCOUNTER — Ambulatory Visit: Payer: Medicare Other

## 2022-12-10 ENCOUNTER — Ambulatory Visit: Payer: Medicare Other | Admitting: Internal Medicine

## 2022-12-23 ENCOUNTER — Encounter: Payer: Self-pay | Admitting: Internal Medicine

## 2022-12-23 ENCOUNTER — Ambulatory Visit: Payer: Medicare Other | Admitting: Internal Medicine

## 2022-12-23 ENCOUNTER — Encounter: Payer: Medicare Other | Admitting: Internal Medicine

## 2022-12-23 VITALS — BP 132/86 | HR 65 | Temp 97.5°F | Ht 63.0 in | Wt 109.2 lb

## 2022-12-23 DIAGNOSIS — I10 Essential (primary) hypertension: Secondary | ICD-10-CM

## 2022-12-23 DIAGNOSIS — M810 Age-related osteoporosis without current pathological fracture: Secondary | ICD-10-CM

## 2022-12-23 DIAGNOSIS — E785 Hyperlipidemia, unspecified: Secondary | ICD-10-CM

## 2022-12-23 DIAGNOSIS — Z1231 Encounter for screening mammogram for malignant neoplasm of breast: Secondary | ICD-10-CM

## 2022-12-23 DIAGNOSIS — R7303 Prediabetes: Secondary | ICD-10-CM | POA: Diagnosis not present

## 2022-12-23 LAB — BASIC METABOLIC PANEL
BUN: 9 mg/dL (ref 6–23)
CO2: 27 meq/L (ref 19–32)
Calcium: 8.9 mg/dL (ref 8.4–10.5)
Chloride: 108 meq/L (ref 96–112)
Creatinine, Ser: 0.49 mg/dL (ref 0.40–1.20)
GFR: 96.5 mL/min (ref >60.00)
Glucose, Bld: 95 mg/dL (ref 70–99)
Potassium: 3.8 meq/L (ref 3.5–5.1)
Sodium: 142 meq/L (ref 135–145)

## 2022-12-23 LAB — VITAMIN D 25 HYDROXY (VIT D DEFICIENCY, FRACTURES): VITD: 39.15 ng/mL (ref 30.00–100.00)

## 2022-12-23 LAB — LIPID PANEL
Cholesterol: 189 mg/dL (ref 0–200)
HDL: 55 mg/dL (ref >39.00)
LDL Cholesterol: 118 mg/dL — ABNORMAL HIGH (ref 0–99)
NonHDL: 134.42
Total CHOL/HDL Ratio: 3
Triglycerides: 82 mg/dL (ref 0.0–149.0)
VLDL: 16.4 mg/dL (ref 0.0–40.0)

## 2022-12-23 LAB — HEMOGLOBIN A1C: Hgb A1c MFr Bld: 6 % (ref 4.6–6.5)

## 2022-12-23 LAB — PHOSPHORUS: Phosphorus: 3.3 mg/dL (ref 2.3–4.6)

## 2022-12-23 NOTE — Patient Instructions (Signed)
Let's consider EVENITY  Osteoporosis  Osteoporosis happens when the bones become thin and less dense than normal. Osteoporosis makes bones more brittle and fragile and more likely to break (fracture). Over time, osteoporosis can cause your bones to become so weak that they fracture after a minor fall. Bones in the hip, wrist, and spine are most likely to fracture due to osteoporosis. What are the causes? The exact cause of this condition is not known. What increases the risk? You are more likely to develop this condition if you: Have family members with this condition. Have poor nutrition. Use the following: Steroid medicines, such as prednisone. Anti-seizure medicines. Nicotine or tobacco, such as cigarettes, e-cigarettes, and chewing tobacco. Are female. Are age 25 or older. Are not physically active (are sedentary). Are of European or Asian descent. Have a small body frame. What are the signs or symptoms? A fracture might be the first sign of osteoporosis, especially if the fracture results from a fall or injury that usually would not cause a bone to break. Other signs and symptoms include: Pain in the neck or low back. Stooped posture. Loss of height. How is this diagnosed? This condition may be diagnosed based on: Your medical history. A physical exam. A bone mineral density test, also called a DXA or DEXA test (dual-energy X-ray absorptiometry test). This test uses X-rays to measure the amount of minerals in your bones. How is this treated? This condition may be treated by: Making lifestyle changes, such as: Including foods with more calcium and vitamin D in your diet. Doing weight-bearing and muscle-strengthening exercises. Stopping tobacco use. Limiting alcohol intake. Taking medicine to slow the process of bone loss or to increase bone density. Taking daily supplements of calcium and vitamin D. Taking hormone replacement medicines, such as estrogen for women and  testosterone for men. Monitoring your levels of calcium and vitamin D. The goal of treatment is to strengthen your bones and lower your risk for a fracture. Follow these instructions at home: Eating and drinking Include calcium and vitamin D in your diet. Calcium is important for bone health, and vitamin D helps your body absorb calcium. Good sources of calcium and vitamin D include: Certain fatty fish, such as salmon and tuna. Products that have calcium and vitamin D added to them (are fortified), such as fortified cereals. Egg yolks. Cheese. Liver.  Activity Do exercises as told by your health care provider. Ask your health care provider what exercises and activities are safe for you. You should do: Exercises that make you work against gravity (weight-bearing exercises), such as tai chi, yoga, or walking. Exercises to strengthen muscles, such as lifting weights. Lifestyle Do not drink alcohol if: Your health care provider tells you not to drink. You are pregnant, may be pregnant, or are planning to become pregnant. If you drink alcohol: Limit how much you use to: 0-1 drink a day for women. 0-2 drinks a day for men. Know how much alcohol is in your drink. In the [place] one drink equals one 12 oz bottle of beer (355 mL), one 5 oz glass of wine (148 mL), or one 1 oz glass of hard liquor (44 mL). Do not use any products that contain nicotine or tobacco, such as cigarettes, e-cigarettes, and chewing tobacco. If you need help quitting, ask your health care provider. Preventing falls Use devices to help you move around (mobility aids) as needed, such as canes, walkers, scooters, or crutches. Keep rooms well-lit and clutter-free. Remove tripping hazards from  walkways, including cords and throw rugs. Install grab bars in bathrooms and safety rails on stairs. Use rubber mats in the bathroom and other areas that are often wet or slippery. Wear closed-toe shoes that fit well and support your  feet. Wear shoes that have rubber soles or low heels. Review your medicines with your health care provider. Some medicines can cause dizziness or changes in blood pressure, which can increase your risk of falling. General instructions Take over-the-counter and prescription medicines only as told by your health care provider. Keep all follow-up visits. This is important. Contact a health care provider if: You have never been screened for osteoporosis and you are: A woman who is age 62 or older. A man who is age 44 or older. Get help right away if: You fall or injure yourself. Summary Osteoporosis is thinning and loss of density in your bones. This makes bones more brittle and fragile and more likely to break (fracture),even with minor falls. The goal of treatment is to strengthen your bones and lower your risk for a fracture. Include calcium and vitamin D in your diet. Calcium is important for bone health, and vitamin D helps your body absorb calcium. Talk with your health care provider about screening for osteoporosis if you are a woman who is age 23 or older, or a man who is age 30 or older. This information is not intended to replace advice given to you by your health care provider. Make sure you discuss any questions you have with your health care provider. Document Revised: 06/23/2019 Document Reviewed: 06/23/2019 Elsevier Patient Education  2024 [employer].

## 2022-12-23 NOTE — Progress Notes (Unsigned)
Subjective:  Patient ID: [id number], female    DOB: Dec 14, 1955  Age: 67 y.o. MRN: [id number]  CC: Hypertension and Hyperlipidemia   HPI Jennilee S Doyon presents for f/up ----  Discussed the use of AI scribe software for clinical note transcription with the patient, who gave verbal consent to proceed.  History of Present Illness   The patient, with a history of osteoporosis, presented with joint pain, particularly in the knee, which had been ongoing for about a month. The pain was more pronounced when bending and kneeling, but walking did not seem to exacerbate the discomfort. The patient had been taking Fosamax for osteoporosis, but stopped about a month ago due to the onset of the joint pain. The patient denied any other side effects from Fosamax, such as trouble swallowing or chest pain.   The patient has been prescribed statin medication but is not taking it. She was also prediabetic as of August. The patient had received a flu shot two months prior to the consultation.       Outpatient Medications Prior to Visit  Medication Sig Dispense Refill   Cholecalciferol (VITAMIN D3) 125 MCG (5000 UT) CAPS Take by mouth.     vitamin C (ASCORBIC ACID) 500 MG tablet Take 500 mg by mouth daily.     alendronate (FOSAMAX) 70 MG tablet Take 70 mg by mouth once a week. (Patient not taking: Reported on 12/23/2022)     CALCIUM Janean Take 500 mg by mouth daily.     rosuvastatin (CRESTOR) 5 MG tablet Take 1 tablet (5 mg total) by mouth daily. (Patient not taking: Reported on 12/23/2022) 90 tablet 0   No facility-administered medications prior to visit.    ROS Review of Systems  Objective:  BP 132/86 (BP Location: Right Arm, Patient Position: Sitting, Cuff Size: Normal)   Pulse 65   Temp (!) 97.5 F (36.4 C) (Oral)   Ht 5\' 3"  (1.6 m)   Wt 109 lb 3.2 oz (49.5 kg)   LMP  (LMP Unknown) Comment: level 1 trauma  SpO2 95%   BMI 19.34 kg/m   BP Readings from Last 3 Encounters:  12/23/22 132/86   09/09/22 (!) 140/94  08/23/22 105/67    Wt Readings from Last 3 Encounters:  12/23/22 109 lb 3.2 oz (49.5 kg)  09/09/22 111 lb (50.3 kg)  04/02/22 113 lb 3.2 oz (51.3 kg)    Physical Exam  Lab Results  Component Value Date   WBC 4.8 09/09/2022   HGB 12.7 09/09/2022   HCT 38.8 09/09/2022   PLT 182.0 09/09/2022   GLUCOSE 95 12/23/2022   CHOL 189 12/23/2022   TRIG 82.0 12/23/2022   HDL 55.00 12/23/2022   LDLCALC 118 (H) 12/23/2022   ALT 16 09/09/2022   AST 25 09/09/2022   NA 142 12/23/2022   K 3.8 12/23/2022   CL 108 12/23/2022   CREATININE 0.49 12/23/2022   BUN 9 12/23/2022   CO2 27 12/23/2022   TSH 1.05 09/09/2022   INR 1.02 07/19/2014   HGBA1C 6.0 12/23/2022    No results found.  Assessment & Plan:  Age-related osteoporosis without current pathological fracture -     Phosphorus; Future -     Basic metabolic panel; Future -     VITAMIN D 25 Hydroxy (Vit-D Deficiency, Fractures); Future  Prediabetes -     Hemoglobin A1c; Future -     Basic metabolic panel; Future  Primary hypertension -     Basic metabolic panel;  Future  Dyslipidemia, goal LDL below 130 -     Lipid panel; Future -     Rosuvastatin Calcium; Take 1 tablet (5 mg total) by mouth daily.  Dispense: 90 tablet; Refill: 1  Screening mammogram for breast cancer -     Digital Screening Mammogram, Left and Right; Future     Follow-up: Return in about 6 months (around 06/23/2023).  Sanda Linger, MD

## 2022-12-24 MED ORDER — ROSUVASTATIN CALCIUM 5 MG PO TABS: 5.0000 mg | ORAL_TABLET | Freq: Every day | ORAL | 1 refills | Status: DC

## 2022-12-25 ENCOUNTER — Other Ambulatory Visit (HOSPITAL_COMMUNITY): Payer: Self-pay

## 2022-12-25 ENCOUNTER — Telehealth: Payer: Self-pay | Admitting: Pharmacy Technician

## 2022-12-25 NOTE — Telephone Encounter (Signed)
-----   Message from Sanda Linger sent at 12/23/2022  9:07 AM EST ----- Regarding: evenity Will you see if she can start evenity?

## 2022-12-29 ENCOUNTER — Other Ambulatory Visit (HOSPITAL_COMMUNITY): Payer: Self-pay

## 2022-12-29 NOTE — Telephone Encounter (Signed)
Evenity VOB initiated via [email or web address]

## 2022-12-31 ENCOUNTER — Encounter: Payer: Self-pay | Admitting: Internal Medicine

## 2023-01-07 NOTE — Telephone Encounter (Signed)
Pt ready for scheduling for EVENITY on or after : 01/07/23  Out-of-pocket cost due at time of visit: $0  Primary: MEDICARE Prolia co-insurance: 0% Admin fee co-insurance: 0%  Secondary: TRICARE Prolia co-insurance:  Admin fee co-insurance:   Medical Benefit Details: Date Benefits were checked: 12/29/22 Deductible: $240 MET OF $240 REQUIRED/ Coinsurance: 0%/ Admin Fee: 0%  Prior Auth: N/A PA# Expiration Date:  # of doses approved:   Pharmacy benefit: Copay $--- If patient wants fill through the pharmacy benefit please send prescription to:  --- , and include estimated need by date in rx notes. Pharmacy will ship medication directly to the office.  Patient NOT eligible for Evenity Copay Card. Copay Card can make patient's cost as little as $25. Link to apply: [email or web address]://[email or web address]  ** This summary of benefits is an estimation of the patient's out-of-pocket cost. Exact cost may very based on individual plan coverage.

## 2023-01-12 ENCOUNTER — Ambulatory Visit: Payer: Medicare Other

## 2023-01-12 VITALS — BP 132/86 | HR 65 | Temp 97.5°F | Ht 63.0 in | Wt 109.0 lb

## 2023-01-12 DIAGNOSIS — Z0000 Encounter for general adult medical examination without abnormal findings: Secondary | ICD-10-CM

## 2023-01-12 NOTE — Patient Instructions (Signed)
It was great speaking with you today!  Please schedule your next Medicare Wellness Visit with your Nurse Health Advisor in 1 year by calling [phone]. 

## 2023-01-12 NOTE — Progress Notes (Signed)
Subjective:   Tiffany Stevens is a 67 y.o. female who presents for an Initial Medicare Annual Wellness Visit.  Visit Complete: Virtual I connected with  Tiffany Stevens on 01/12/23 by a audio enabled telemedicine application and verified that I am speaking with the correct person using two identifiers.  Patient Location: Home  Provider Location: Office/Clinic  I discussed the limitations of evaluation and management by telemedicine. The patient expressed understanding and agreed to proceed.  Vital Signs: Because this visit was a virtual/telehealth visit, some criteria may be missing or patient reported. Any vitals not documented were not able to be obtained and vitals that have been documented are patient reported.  Patient Medicare AWV questionnaire was completed by the patient on 12/22/22; I have confirmed that all information answered by patient is correct and no changes since this date.  Cardiac Risk Factors include: hypertension;advanced age (>4men, >7 women)     Objective:    Today's Vitals   01/12/23 0755  BP: 132/86  Pulse: 65  Temp: (!) 97.5 F (36.4 C)  SpO2: 95%  Weight: 109 lb (49.4 kg)  Height: 5\' 3"  (1.6 m)   Body mass index is 19.31 kg/m.  Per patient no change in vitals since last visit, unable to obtain new vitals due to telehealth visit      01/12/2023    8:30 AM 02/11/2022    8:01 AM 08/08/2021    8:18 AM 04/02/2015    2:23 PM 01/10/2015   10:12 AM 10/11/2014   11:37 AM 10/02/2014   11:18 AM  Advanced Directives  Does Patient Have a Medical Advance Directive? Yes Yes Yes      Type of Advance Directive Healthcare Power of [employer] Power of Attorney Living will      Does patient want to make changes to medical advance directive? No - Patient declined No - Patient declined       Copy of Healthcare Power of Attorney in Chart? No - copy requested No - copy requested          Information is confidential and restricted. Go to Review Flowsheets to  unlock data.    Current Medications (verified) Outpatient Encounter Medications as of 01/12/2023  Medication Sig   Cholecalciferol (VITAMIN D3) 125 MCG (5000 UT) CAPS Take by mouth.   rosuvastatin (CRESTOR) 5 MG tablet Take 1 tablet (5 mg total) by mouth daily.   vitamin C (ASCORBIC ACID) 500 MG tablet Take 500 mg by mouth daily.   No facility-administered encounter medications on file as of 01/12/2023.    Allergies (verified) Patient has no known allergies.   History: Past Medical History:  Diagnosis Date   Constipation    Hemorrhoids    Osteoporosis    Seasonal allergies    Past Surgical History:  Procedure Laterality Date   ANTERIOR CERVICAL DECOMP/DISCECTOMY FUSION N/A 06/27/2014   Procedure: ANTERIOR CERVICAL DECOMPRESSION/DISCECTOMY FUSION CERVICAL SIX-SEVEN;  Surgeon: Hilda Lias, MD;  Location: [facility];  Service: Neurosurgery;  Laterality: N/A;   CLOSED REDUCTION NASAL FRACTURE N/A 07/04/2014   Procedure: CLOSED REDUCTION NASAL FRACTURE;  Surgeon: Drema Halon, MD;  Location: [facility] OR;  Service: ENT;  Laterality: N/A;   COLONOSCOPY WITH PROPOFOL N/A 08/08/2021   Procedure: COLONOSCOPY WITH PROPOFOL;  Surgeon: Rachael Fee, MD;  Location: [facility];  Service: Gastroenterology;  Laterality: N/A;   HEMORRHOID SURGERY  06/04/10   with rectopexy.   TRACHEOSTOMY TUBE PLACEMENT N/A 07/04/2014   Procedure: TRACHEOSTOMY;  Surgeon:  Drema Halon, MD;  Location: [facility] OR;  Service: ENT;  Laterality: N/A;   Family History  Problem Relation Age of Onset   Cancer Mother        colon   Breast cancer Neg Hx    Social History   Socioeconomic History   Marital status: Married    Spouse name: Not on file   Number of children: Not on file   Years of education: Not on file   Highest education level: 12th grade  Occupational History   Not on file  Tobacco Use   Smoking status: Never    Passive exposure: Never   Smokeless tobacco: Never  Vaping Use    Vaping status: Never Used  Substance and Sexual Activity   Alcohol use: Never   Drug use: Never   Sexual activity: Not Currently    Partners: Male    Birth control/protection: Post-menopausal  Other Topics Concern   Not on file  Social History Narrative   Not on file   Social Drivers of Health   Financial Resource Strain: Low Risk  (01/12/2023)   Overall Financial Resource Strain (CARDIA)    Difficulty of Paying Living Expenses: Not very hard  Food Insecurity: No Food Insecurity (01/12/2023)   Hunger Vital Sign    Worried About Running Out of Food in the Last Year: Never true    Ran Out of Food in the Last Year: Never true  Transportation Needs: No Transportation Needs (01/12/2023)   PRAPARE - Administrator, Civil Service (Medical): No    Lack of Transportation (Non-Medical): No  Physical Activity: Insufficiently Active (01/12/2023)   Exercise Vital Sign    Days of Exercise per Week: 2 days    Minutes of Exercise per Session: 40 min  Stress: No Stress Concern Present (01/12/2023)   [employer] of Occupational Health - Occupational Stress Questionnaire    Feeling of Stress : Not at all  Social Connections: Moderately Integrated (01/12/2023)   Social Connection and Isolation Panel [NHANES]    Frequency of Communication with Friends and Family: More than three times a week    Frequency of Social Gatherings with Friends and Family: More than three times a week    Attends Religious Services: More than 4 times per year    Active Member of [employer] or Organizations: No    Attends Engineer, structural: Never    Marital Status: Married    Tobacco Counseling Counseling given: Not Answered   Clinical Intake:  Pre-visit preparation completed: Yes  Pain : 0-10 Pain Type: Chronic pain Pain Location:  (joints) Pain Descriptors / Indicators: Aching Pain Onset: More than a month ago Pain Frequency: Constant Effect of Pain on Daily Activities: bending and  moving     BMI - recorded: 19 Nutritional Status: BMI of 19-24  Normal Nutritional Risks: None Diabetes: No  How often do you need to have someone help you when you read instructions, pamphlets, or other written materials from your doctor or pharmacy?: 1 - Never What is the last grade level you completed in school?: high school  Interpreter Needed?: No  Information entered by :: Elyse Jarvis, CMA   Activities of Daily Living    01/12/2023    8:30 AM 01/09/2023    1:28 PM  In your present state of health, do you have any difficulty performing the following activities:  Hearing? 0 0  Vision? 0 0  Difficulty concentrating or making decisions? 0 0  Walking  or climbing stairs? 0 0  Dressing or bathing? 0 0  Doing errands, shopping? 0 0  Preparing Food and eating ? N N  Using the Toilet? N N  In the past six months, have you accidently leaked urine? N N  Do you have problems with loss of bowel control? N N  Managing your Medications? N N  Managing your Finances? N N  Housekeeping or managing your Housekeeping? N N    Patient Care Team: Etta Grandchild, MD as PCP - General (Internal Medicine) Juline Patch, MD (Internal Medicine)  Indicate any recent Medical Services you may have received from other than [facility] providers in the past year (date may be approximate).     Assessment:   This is a routine wellness examination for Unknown.  Hearing/Vision screen Patient denied any hearing difficulty. No hearing aids. Patient does wear corrective lenses.   Goals Addressed             This Visit's Progress    Patient Stated       I would like work on bringing down my cholesterol.        Depression Screen    01/12/2023    8:29 AM 09/09/2022   11:07 AM 10/11/2014   11:45 AM 08/22/2014   11:27 AM  PHQ 2/9 Scores  PHQ - 2 Score 0 0    PHQ- 9 Score  0       Information is confidential and restricted. Go to Review Flowsheets to unlock data.    Fall Risk    01/12/2023     8:30 AM 01/09/2023    1:28 PM 09/09/2022   11:07 AM 01/10/2015   10:13 AM 10/11/2014   11:45 AM  Fall Risk   Falls in the past year? 0 0 0    Number falls in past yr: 0 0 0    Injury with Fall? 0 0 0    Risk for fall due to : No Fall Risks  No Fall Risks    Follow up Falls evaluation completed  Falls evaluation completed       Information is confidential and restricted. Go to Review Flowsheets to unlock data.    MEDICARE RISK AT HOME: Medicare Risk at Home Any stairs in or around the home?: Yes If so, are there any without handrails?: No Home free of loose throw rugs in walkways, pet beds, electrical cords, etc?: Yes Adequate lighting in your home to reduce risk of falls?: Yes Life alert?: No Use of a cane, walker or w/c?: No Grab bars in the bathroom?: Yes Shower chair or bench in shower?: Yes Elevated toilet seat or a handicapped toilet?: No  TIMED UP AND GO:  Was the test performed? No    Cognitive Function:  Patient is cogitatively intact.      01/12/2023    8:31 AM  6CIT Screen  What Year? 0 points  What month? 0 points  What time? 0 points  Count back from 20 0 points  Months in reverse 0 points  Repeat phrase 0 points  Total Score 0 points    Immunizations Immunization History  Administered Date(s) Administered   Fluad Quad(high Dose 65+) 10/23/2022   PFIZER(Purple Top)SARS-COV-2 Vaccination 01/25/2019, 02/15/2019   PNEUMOCOCCAL CONJUGATE-20 07/02/2021   Tdap 02/11/2017   Zoster Recombinant(Shingrix) 10/20/2017, 01/28/2018    TDAP status: Up to date  Flu Vaccine status: Up to date  Pneumococcal vaccine status: Up to date  Covid-19 vaccine status: Completed vaccines  Qualifies for Shingles Vaccine? Yes   Zostavax completed No   Shingrix Completed?: Yes  Screening Tests Health Maintenance  Topic Date Due   Medicare Annual Wellness (AWV)  Never done   COVID-19 Vaccine (3 - 2024-25 season) 09/21/2022   MAMMOGRAM  12/18/2023    DTaP/Tdap/Td (2 - Td or Tdap) 02/12/2027   Colonoscopy  08/09/2031   Pneumonia Vaccine 2+ Years old  Completed   INFLUENZA VACCINE  Completed   DEXA SCAN  Completed   Hepatitis C Screening  Completed   Zoster Vaccines- Shingrix  Completed   HPV VACCINES  Aged Out    Health Maintenance  Health Maintenance Due  Topic Date Due   Medicare Annual Wellness (AWV)  Never done   COVID-19 Vaccine (3 - 2024-25 season) 09/21/2022    Colorectal cancer screening: Type of screening: Colonoscopy. Completed 08/08/21. Repeat every 10 years  Mammogram status: appointment on 01/29/23  Bone Density status: Completed 12/17/21. Results reflect: Bone density results: OSTEOPOROSIS. Repeat every n/a years.  Lung Cancer Screening: (Low Dose CT Chest recommended if Age 59-80 years, 20 pack-year currently smoking OR have quit w/in 15years.) does not qualify.   Lung Cancer Screening Referral: n/a  Additional Screening:  Hepatitis C Screening: does qualify; Completed 09/09/22  Vision Screening: Recommended annual ophthalmology exams for early detection of glaucoma and other disorders of the eye. Is the patient up to date with their annual eye exam?  Yes  Who is the provider or what is the name of the office in which the patient attends annual eye exams? [facility] If pt is not established with a provider, would they like to be referred to a provider to establish care? No .   Dental Screening: Recommended annual dental exams for proper oral hygiene   Community Resource Referral / Chronic Care Management: CRR required this visit?  No   CCM required this visit?  No     Plan:     I have personally reviewed and noted the following in the patient's chart:   Medical and social history Use of alcohol, tobacco or illicit drugs  Current medications and supplements including opioid prescriptions. Patient is not currently taking opioid prescriptions. Functional ability and status Nutritional status Physical  activity Advanced directives List of other physicians Hospitalizations, surgeries, and ER visits in previous 12 months Vitals Screenings to include cognitive, depression, and falls Referrals and appointments  In addition, I have reviewed and discussed with patient certain preventive protocols, quality metrics, and best practice recommendations. A written personalized care plan for preventive services as well as general preventive health recommendations were provided to patient.     Marinus Maw, CMA   01/12/2023   After Visit Summary: (MyChart) Due to this being a telephonic visit, the after visit summary with patients personalized plan was offered to patient via MyChart   Nurse Notes: N/A

## 2023-01-15 NOTE — Telephone Encounter (Signed)
Patient cleared from below PA for a Jan. 1/6 injection? Or do we need to redo PA after 01/21/2023?

## 2023-01-22 ENCOUNTER — Ambulatory Visit
Admission: RE | Admit: 2023-01-22 | Discharge: 2023-01-22 | Disposition: A | Discharge status: Home / Self Care | Payer: Medicare Other | Source: Ambulatory Visit | Attending: Internal Medicine | Admitting: Internal Medicine

## 2023-01-22 ENCOUNTER — Ambulatory Visit: Payer: Medicare Other

## 2023-01-22 ENCOUNTER — Other Ambulatory Visit: Payer: Self-pay | Admitting: Internal Medicine

## 2023-01-22 DIAGNOSIS — Z1231 Encounter for screening mammogram for malignant neoplasm of breast: Secondary | ICD-10-CM

## 2023-01-22 DIAGNOSIS — M810 Age-related osteoporosis without current pathological fracture: Secondary | ICD-10-CM

## 2023-01-22 MED ORDER — ROMOSOZUMAB-AQQG 105 MG/1.17ML ~~LOC~~ SOSY
210.0000 mg | PREFILLED_SYRINGE | Freq: Once | SUBCUTANEOUS | Status: AC
  Administered 2023-02-25: 210 mg via SUBCUTANEOUS

## 2023-01-22 NOTE — Progress Notes (Signed)
 Patient seen in office today and first dose of Evenity administered in her left arm. Patient tolerated injection well.

## 2023-01-23 ENCOUNTER — Telehealth: Payer: Self-pay

## 2023-01-23 NOTE — Code Documentation (Signed)
 Charge has been added.

## 2023-01-23 NOTE — Telephone Encounter (Signed)
 Evenity VOB initiated via [email or web address]  Last Evenity inj: 01/22/23 Next Evenity inj DUE: 02/21/23

## 2023-01-26 ENCOUNTER — Ambulatory Visit: Payer: Medicare Other

## 2023-01-27 NOTE — Telephone Encounter (Signed)
 Marland Kitchen

## 2023-01-27 NOTE — Telephone Encounter (Signed)
 Pt ready for scheduling for EVENITY  on or after : 02/21/23  Out-of-pocket cost due at time of visit: $0  Number of injection/visits approved: ---  Primary: MEDICARE Prolia  co-insurance: 0% Admin fee co-insurance: 0%  Secondary: TRICARE Prolia  co-insurance: The plan wil coordinate benefits and will consider 100% of remaining expenses. No deductible applies.  Admin fee co-insurance:   Medical Benefit Details: Date Benefits were checked: 01/26/23 Deductible: $0 Met of $257 Required/ Coinsurance: 0%/ Admin Fee: 0%  Prior Auth: N/A PA# Expiration Date:  # of doses approved:  Pharmacy benefit: Copay $--- If patient wants fill through the pharmacy benefit please send prescription to:  --- , and include estimated need by date in rx notes. Pharmacy will ship medication directly to the office.  Patient NOT eligible for Evenity  Copay Card. Copay Card can make patient's cost as little as $25. Link to apply: [email or web address]://www.[email or web address].[email or web address]  ** This summary of benefits is an estimation of the patient's out-of-pocket cost. Exact cost may very based on individual plan coverage.

## 2023-01-30 MED ORDER — DENOSUMAB 60 MG/ML ~~LOC~~ SOSY
60.0000 mg | PREFILLED_SYRINGE | Freq: Once | SUBCUTANEOUS | Status: AC
  Administered 2023-01-22: 60 mg via SUBCUTANEOUS

## 2023-01-30 NOTE — Addendum Note (Signed)
 Addended by: Karma Ganja on: 01/30/2023 01:05 PM   Modules accepted: Orders

## 2023-02-25 ENCOUNTER — Ambulatory Visit: Payer: Medicare Other

## 2023-02-25 DIAGNOSIS — M810 Age-related osteoporosis without current pathological fracture: Secondary | ICD-10-CM

## 2023-02-25 MED ORDER — ROMOSOZUMAB-AQQG 105 MG/1.17ML ~~LOC~~ SOSY: 210.0000 mg | PREFILLED_SYRINGE | SUBCUTANEOUS | Status: AC

## 2023-02-25 NOTE — Progress Notes (Signed)
 Patient visits today to receive her Evenity  (2 of 12) injection. Patient was informed and tolerated well. Patient was notified to reach out to us  if needed.

## 2023-02-26 NOTE — Progress Notes (Signed)
 Patient is cleared for EVENTIY injection on 02/25/2023.  Mentioned in provider note last on 12/23/2022. Medication is CLINIC supplied. CoPay:$0

## 2023-03-19 ENCOUNTER — Other Ambulatory Visit (HOSPITAL_COMMUNITY): Payer: Self-pay

## 2023-03-20 ENCOUNTER — Other Ambulatory Visit (HOSPITAL_COMMUNITY): Payer: Self-pay

## 2023-03-24 LAB — COMPREHENSIVE METABOLIC PANEL WITH GFR: EGFR (Non-African Amer.): 92

## 2023-03-25 ENCOUNTER — Ambulatory Visit: Admitting: Internal Medicine

## 2023-03-25 ENCOUNTER — Ambulatory Visit: Payer: Medicare Other

## 2023-03-25 ENCOUNTER — Encounter: Payer: Self-pay | Admitting: Internal Medicine

## 2023-03-25 ENCOUNTER — Ambulatory Visit
Admission: RE | Admit: 2023-03-25 | Discharge: 2023-03-25 | Disposition: A | Discharge status: Home / Self Care | Source: Ambulatory Visit | Attending: Internal Medicine | Admitting: Internal Medicine

## 2023-03-25 VITALS — BP 122/76 | HR 63 | Temp 98.0°F | Resp 16 | Ht 63.0 in | Wt 110.2 lb

## 2023-03-25 DIAGNOSIS — M810 Age-related osteoporosis without current pathological fracture: Secondary | ICD-10-CM | POA: Diagnosis not present

## 2023-03-25 DIAGNOSIS — R7303 Prediabetes: Secondary | ICD-10-CM

## 2023-03-25 DIAGNOSIS — E785 Hyperlipidemia, unspecified: Secondary | ICD-10-CM

## 2023-03-25 DIAGNOSIS — H04123 Dry eye syndrome of bilateral lacrimal glands: Secondary | ICD-10-CM | POA: Insufficient documentation

## 2023-03-25 NOTE — Progress Notes (Signed)
 Subjective:  Patient ID: [id number], female    DOB: 08/16/1955  Age: 68 y.o. MRN: [id number]  CC: Hyperlipidemia   HPI Tiffany Stevens presents for f/up ----  Discussed the use of AI scribe software for clinical note transcription with the patient, who gave verbal consent to proceed.  History of Present Illness   Tiffany Stevens is a 68 year old female who presents for follow-up.  She remains active, working every day and walking regularly, with her last long walk about a week ago without any issues such as chest pain or shortness of breath. No weakness, dizziness, or lightheadedness.  She has been evaluated by a rheumatologist due to concerns about possible rheumatological disease. She has a positive ANA and elevated Sjogren's titer, but she is asymptomatic. She experiences some dryness of the eyes, which she had prior to these evaluations. There was a previous consideration of lupus due to an elevated Smith's test, but this has not been confirmed.  She is currently receiving Evenity injections for osteoporosis, with the last injection administered a month ago. She has not reported any side effects from the treatment.  She is taking Crestor for hyperlipidemia and has not reported any side effects. Her blood pressure is well-controlled at 122/76.  She discusses staying active and walking regularly, with no issues reported during physical activity.       Outpatient Medications Prior to Visit  Medication Sig Dispense Refill   CALCIUM Tyliah Take by mouth.     Cholecalciferol (VITAMIN D3) 125 MCG (5000 UT) CAPS Take by mouth.     rosuvastatin (CRESTOR) 5 MG tablet Take 1 tablet (5 mg total) by mouth daily. 90 tablet 1   vitamin C (ASCORBIC ACID) 500 MG tablet Take 500 mg by mouth daily.     Facility-Administered Medications Prior to Visit  Medication Dose Route Frequency Provider Last Rate Last Admin   Romosozumab-aqqg (EVENITY) 105 MG/1. injection 210 mg  210 mg Subcutaneous Q30 days  Etta Grandchild, MD        ROS Review of Systems  Constitutional: Negative.  Negative for appetite change, diaphoresis, fatigue and unexpected weight change.  HENT: Negative.    Eyes: Negative.   Respiratory:  Negative for cough, chest tightness, shortness of breath and wheezing.   Cardiovascular:  Negative for chest pain, palpitations and leg swelling.  Gastrointestinal:  Negative for abdominal pain, constipation, diarrhea, nausea and vomiting.  Endocrine: Negative.   Genitourinary:  Negative for difficulty urinating.  Musculoskeletal: Negative.  Negative for myalgias.  Skin: Negative.   Neurological:  Negative for dizziness and weakness.  Hematological:  Negative for adenopathy. Does not bruise/bleed easily.  Psychiatric/Behavioral: Negative.      Objective:  BP 122/76 (BP Location: Left Arm, Patient Position: Sitting, Cuff Size: Small)   Pulse 63   Temp 98 F (36.7 C) (Oral)   Resp 16   Ht 5\' 3"  (1.6 m)   Wt 110 lb 3.2 oz (50 kg)   LMP  (LMP Unknown) Comment: level 1 trauma  SpO2 95%   BMI 19.52 kg/m   BP Readings from Last 3 Encounters:  03/25/23 122/76  01/12/23 132/86  12/23/22 132/86    Wt Readings from Last 3 Encounters:  03/25/23 110 lb 3.2 oz (50 kg)  01/12/23 109 lb (49.4 kg)  12/23/22 109 lb 3.2 oz (49.5 kg)    Physical Exam Vitals reviewed.  Constitutional:      Appearance: She is not ill-appearing.  HENT:  Mouth/Throat:     Mouth: Mucous membranes are moist.  Eyes:     General: No scleral icterus.    Conjunctiva/sclera: Conjunctivae normal.  Cardiovascular:     Rate and Rhythm: Normal rate and regular rhythm.     Heart sounds: No murmur heard.    No friction rub. No gallop.  Pulmonary:     Effort: Pulmonary effort is normal.     Breath sounds: No stridor. No wheezing, rhonchi or rales.  Abdominal:     General: Abdomen is flat.     Palpations: There is no mass.     Tenderness: There is no abdominal tenderness. There is no guarding.      Hernia: No hernia is present.  Musculoskeletal:        General: Normal range of motion.     Cervical back: Neck supple.     Right lower leg: No edema.     Left lower leg: No edema.  Lymphadenopathy:     Cervical: No cervical adenopathy.  Skin:    General: Skin is warm and dry.  Neurological:     General: No focal deficit present.     Mental Status: She is alert. Mental status is at baseline.  Psychiatric:        Mood and Affect: Mood normal.        Behavior: Behavior normal.     Lab Results  Component Value Date   WBC 4.8 09/09/2022   HGB 12.7 09/09/2022   HCT 38.8 09/09/2022   PLT 182.0 09/09/2022   GLUCOSE 95 12/23/2022   CHOL 189 12/23/2022   TRIG 82.0 12/23/2022   HDL 55.00 12/23/2022   LDLCALC 118 (H) 12/23/2022   ALT 16 09/09/2022   AST 25 09/09/2022   NA 142 12/23/2022   K 3.8 12/23/2022   CL 108 12/23/2022   CREATININE 0.49 12/23/2022   BUN 9 12/23/2022   CO2 27 12/23/2022   TSH 1.05 09/09/2022   INR 1.02 07/19/2014   HGBA1C 6.0 12/23/2022    MM 3D SCREENING MAMMOGRAM BILATERAL BREAST Result Date: 01/26/2023 CLINICAL DATA:  Screening. EXAM: DIGITAL SCREENING BILATERAL MAMMOGRAM WITH TOMOSYNTHESIS AND CAD TECHNIQUE: Bilateral screening digital craniocaudal and mediolateral oblique mammograms were obtained. Bilateral screening digital breast tomosynthesis was performed. The images were evaluated with computer-aided detection. COMPARISON:  Previous exam(s). ACR Breast Density Category b: There are scattered areas of fibroglandular density. FINDINGS: There are no findings suspicious for malignancy. IMPRESSION: No mammographic evidence of malignancy. A result letter of this screening mammogram will be mailed directly to the patient. RECOMMENDATION: Screening mammogram in one year. (Code:SM-B-01Y) BI-RADS CATEGORY  1: Negative. Electronically Signed   By: Emmaline Kluver M.D.   On: 01/26/2023 13:46    Assessment & Plan:  Prediabetes  Age-related osteoporosis  without current pathological fracture- Will continue evenity.  Dyslipidemia, goal LDL below 130- LDL goal achieved. Doing well on the statin. Will risk stratify with a coronary calcium score.  -     CT CARDIAC SCORING ([facility] LOCATIONS ONLY); Future     Follow-up: Return in about 4 months (around 07/25/2023).  Sanda Linger, MD

## 2023-03-25 NOTE — Patient Instructions (Signed)

## 2023-03-27 ENCOUNTER — Other Ambulatory Visit

## 2023-03-30 ENCOUNTER — Encounter: Payer: Self-pay | Admitting: Internal Medicine

## 2023-06-24 ENCOUNTER — Encounter: Payer: Self-pay | Admitting: Internal Medicine

## 2023-06-24 ENCOUNTER — Ambulatory Visit (INDEPENDENT_AMBULATORY_CARE_PROVIDER_SITE_OTHER)

## 2023-06-24 ENCOUNTER — Ambulatory Visit: Payer: Self-pay | Admitting: Internal Medicine

## 2023-06-24 ENCOUNTER — Ambulatory Visit: Payer: Medicare Other | Admitting: Internal Medicine

## 2023-06-24 VITALS — BP 118/82 | HR 63 | Temp 98.0°F | Resp 16 | Wt 110.0 lb

## 2023-06-24 DIAGNOSIS — R7303 Prediabetes: Secondary | ICD-10-CM | POA: Diagnosis not present

## 2023-06-24 DIAGNOSIS — M810 Age-related osteoporosis without current pathological fracture: Secondary | ICD-10-CM

## 2023-06-24 DIAGNOSIS — M25561 Pain in right knee: Secondary | ICD-10-CM | POA: Diagnosis not present

## 2023-06-24 DIAGNOSIS — E785 Hyperlipidemia, unspecified: Secondary | ICD-10-CM

## 2023-06-24 DIAGNOSIS — E559 Vitamin D deficiency, unspecified: Secondary | ICD-10-CM | POA: Insufficient documentation

## 2023-06-24 DIAGNOSIS — G8929 Other chronic pain: Secondary | ICD-10-CM

## 2023-06-24 DIAGNOSIS — J302 Other seasonal allergic rhinitis: Secondary | ICD-10-CM | POA: Insufficient documentation

## 2023-06-24 DIAGNOSIS — M25562 Pain in left knee: Secondary | ICD-10-CM

## 2023-06-24 DIAGNOSIS — R319 Hematuria, unspecified: Secondary | ICD-10-CM | POA: Insufficient documentation

## 2023-06-24 LAB — LIPID PANEL
Cholesterol: 149 mg/dL (ref 0–200)
HDL: 69.9 mg/dL (ref >39.00)
LDL Cholesterol: 70 mg/dL (ref 0–99)
NonHDL: 79.54
Total CHOL/HDL Ratio: 2
Triglycerides: 50 mg/dL (ref 0.0–149.0)
VLDL: 10 mg/dL (ref 0.0–40.0)

## 2023-06-24 LAB — CBC WITH DIFFERENTIAL/PLATELET
Basophils Absolute: 0 10*3/uL (ref 0.0–0.1)
Basophils Relative: 0.9 % (ref 0.0–3.0)
Eosinophils Absolute: 0.1 10*3/uL (ref 0.0–0.7)
Eosinophils Relative: 1.5 % (ref 0.0–5.0)
HCT: 36.8 % (ref 36.0–46.0)
Hemoglobin: 12.5 g/dL (ref 12.0–15.0)
Lymphocytes Relative: 28.6 % (ref 12.0–46.0)
Lymphs Abs: 1.3 10*3/uL (ref 0.7–4.0)
MCHC: 33.9 g/dL (ref 30.0–36.0)
MCV: 98.5 fl (ref 78.0–100.0)
Monocytes Absolute: 0.4 10*3/uL (ref 0.1–1.0)
Monocytes Relative: 7.9 % (ref 3.0–12.0)
Neutro Abs: 2.8 10*3/uL (ref 1.4–7.7)
Neutrophils Relative %: 61.1 % (ref 43.0–77.0)
Platelets: 153 10*3/uL (ref 150.0–400.0)
RBC: 3.74 Mil/uL — ABNORMAL LOW (ref 3.87–5.11)
RDW: 12.9 % (ref 11.5–15.5)
WBC: 4.5 10*3/uL (ref 4.0–10.5)

## 2023-06-24 LAB — HEPATIC FUNCTION PANEL
ALT: 20 U/L (ref 0–35)
AST: 25 U/L (ref 0–37)
Albumin: 4.4 g/dL (ref 3.5–5.2)
Alkaline Phosphatase: 41 U/L (ref 39–117)
Bilirubin, Direct: 0.2 mg/dL (ref 0.0–0.3)
Total Bilirubin: 0.9 mg/dL (ref 0.2–1.2)
Total Protein: 7.1 g/dL (ref 6.0–8.3)

## 2023-06-24 LAB — BASIC METABOLIC PANEL WITH GFR
BUN: 11 mg/dL (ref 6–23)
CO2: 28 meq/L (ref 19–32)
Calcium: 9.3 mg/dL (ref 8.4–10.5)
Chloride: 105 meq/L (ref 96–112)
Creatinine, Ser: 0.62 mg/dL (ref 0.40–1.20)
GFR: 90.86 mL/min (ref >60.00)
Glucose, Bld: 93 mg/dL (ref 70–99)
Potassium: 3.8 meq/L (ref 3.5–5.1)
Sodium: 140 meq/L (ref 135–145)

## 2023-06-24 LAB — CK: Total CK: 116 U/L (ref 7–177)

## 2023-06-24 LAB — HEMOGLOBIN A1C: Hgb A1c MFr Bld: 6 % (ref 4.6–6.5)

## 2023-06-24 MED ORDER — ROSUVASTATIN CALCIUM 5 MG PO TABS: 5.0000 mg | ORAL_TABLET | Freq: Every day | ORAL | 1 refills | Status: DC

## 2023-06-24 NOTE — Progress Notes (Signed)
 Subjective:  Patient ID: [id number], female    DOB: 1955-03-12  Age: 68 y.o. MRN: [id number]  CC: Medical Management of Chronic Issues (Pain in patient knees when she bending. She states that she feels like something is moving around. ), Osteoarthritis, and Hyperlipidemia   HPI Tiffany Stevens presents for f/up ----  Discussed the use of AI scribe software for clinical note transcription with the patient, who gave verbal consent to proceed.  History of Present Illness   Tiffany Stevens is a 68 year old female who presents with worsening bilateral knee pain.  She describes worsening pain in both knees, characterizing it as a 'knitter joint' pain that occasionally migrates. The pain is aggravated by activities such as bending, kneeling, and standing up, but tends to improve with walking. There is no recent history of knee injury, although she notes occasional swelling that fluctuates. She has not been using any specific medication for the knee pain recently, though she sometimes takes pain relievers for headaches.  She is currently receiving Prolia  injections every six months for bone density issues. There was a previous issue with Evenity  injections due to billing problems, and she has not received any injections since then. She is also on Crestor  for cholesterol management.  No pain in small joints and no recent headaches. She reports occasional swelling in the knees.  She walks regularly and denies DOE, CP, SOB, edema.       Outpatient Medications Prior to Visit  Medication Sig Dispense Refill   Calcium  Carb-Cholecalciferol (CALCIUM  500/D Janaya) Take by mouth.     rosuvastatin  (CRESTOR ) 5 MG tablet Take 1 tablet (5 mg total) by mouth daily. 90 tablet 1   CALCIUM  Cornie Take by mouth.     Cholecalciferol (VITAMIN D3) 125 MCG (5000 UT) CAPS Take by mouth.     vitamin C (ASCORBIC ACID) 500 MG tablet Take 500 mg by mouth daily.     No facility-administered medications prior to visit.     ROS Review of Systems  Constitutional:  Negative for appetite change, chills, diaphoresis, fatigue and fever.  HENT: Negative.    Eyes: Negative.   Respiratory: Negative.  Negative for cough, chest tightness, shortness of breath and wheezing.   Cardiovascular:  Negative for chest pain, palpitations and leg swelling.  Gastrointestinal:  Negative for abdominal pain, constipation, diarrhea, nausea and vomiting.  Genitourinary: Negative.  Negative for difficulty urinating.  Musculoskeletal:  Positive for arthralgias. Negative for myalgias.  Neurological:  Negative for dizziness and weakness.  Hematological:  Negative for adenopathy. Does not bruise/bleed easily.  Psychiatric/Behavioral: Negative.      Objective:  BP 118/82 (BP Location: Left Arm, Patient Position: Sitting, Cuff Size: Small)   Pulse 63   Temp 98 F (36.7 C) (Oral)   Resp 16   Wt 110 lb (49.9 kg)   LMP  (LMP Unknown) Comment: level 1 trauma  SpO2 95%   BMI 19.49 kg/m   BP Readings from Last 3 Encounters:  06/24/23 118/82  03/25/23 122/76  01/12/23 132/86    Wt Readings from Last 3 Encounters:  06/24/23 110 lb (49.9 kg)  03/25/23 110 lb 3.2 oz (50 kg)  01/12/23 109 lb (49.4 kg)    Physical Exam Vitals reviewed.  Constitutional:      Appearance: Normal appearance.  HENT:     Nose: Nose normal.     Mouth/Throat:     Mouth: Mucous membranes are moist.  Eyes:     General: No  scleral icterus.    Conjunctiva/sclera: Conjunctivae normal.  Cardiovascular:     Rate and Rhythm: Normal rate and regular rhythm.     Pulses: Normal pulses.     Heart sounds: No murmur heard.    No friction rub. No gallop.  Pulmonary:     Effort: Pulmonary effort is normal.     Breath sounds: No stridor. No wheezing, rhonchi or rales.  Abdominal:     General: Abdomen is flat.     Palpations: There is no mass.     Tenderness: There is no abdominal tenderness. There is no guarding.     Hernia: No hernia is present.   Musculoskeletal:        General: Normal range of motion.     Cervical back: Neck supple.     Right knee: Normal. No swelling, deformity, effusion, erythema or crepitus. Normal range of motion. No tenderness.     Left knee: Normal. No swelling, deformity, effusion, erythema or crepitus. Normal range of motion. No tenderness.     Right lower leg: No edema.     Left lower leg: No edema.  Lymphadenopathy:     Cervical: No cervical adenopathy.  Skin:    General: Skin is warm and dry.     Findings: No rash.  Neurological:     General: No focal deficit present.     Mental Status: She is alert. Mental status is at baseline.  Psychiatric:        Mood and Affect: Mood normal.        Behavior: Behavior normal.     Lab Results  Component Value Date   WBC 4.5 06/24/2023   HGB 12.5 06/24/2023   HCT 36.8 06/24/2023   PLT 153.0 06/24/2023   GLUCOSE 93 06/24/2023   CHOL 149 06/24/2023   TRIG 50.0 06/24/2023   HDL 69.90 06/24/2023   LDLCALC 70 06/24/2023   ALT 20 06/24/2023   AST 25 06/24/2023   NA 140 06/24/2023   K 3.8 06/24/2023   CL 105 06/24/2023   CREATININE 0.62 06/24/2023   BUN 11 06/24/2023   CO2 28 06/24/2023   TSH 1.05 09/09/2022   INR 1.02 07/19/2014   HGBA1C 6.0 06/24/2023    CT CARDIAC SCORING ([facility] LOCATIONS ONLY) Result Date: 03/30/2023 CLINICAL DATA:  68 year old Asian female with history of high cholesterol. Evaluate for coronary artery disease. * Tracking Code: FCC * EXAM: CT CARDIAC CORONARY ARTERY CALCIUM  SCORE TECHNIQUE: Non-contrast imaging through the heart was performed using prospective ECG gating. Image post processing was performed on an independent workstation, allowing for quantitative analysis of the heart and coronary arteries. Note that this exam targets the heart and the chest was not imaged in its entirety. COMPARISON:  No priors. FINDINGS: CORONARY CALCIUM  SCORES: Left Main: 0 LAD: 0 LCx: 0 RCA/PDA: 14 Total Agatston Score: 14 MESA database percentile:  58th AORTA MEASUREMENTS: Ascending Aorta: 3.3 cm Descending Aorta:2.4 cm OTHER FINDINGS: Atherosclerotic calcifications are noted in the thoracic aorta. Within the visualized portions of the thorax there are no suspicious appearing pulmonary nodules or masses, there is no acute consolidative airspace disease, no pleural effusions, no pneumothorax and no lymphadenopathy. Visualized portions of the upper abdomen are unremarkable. There are no aggressive appearing lytic or blastic lesions noted in the visualized portions of the skeleton. IMPRESSION: 1. Patient's total coronary artery calcium  score is 14 which is 58th percentile for patient's of matched age, gender and race/ethnicity. Please note that although the presence of coronary artery calcium   documents the presence of coronary artery disease, the severity of this disease and any potential stenosis cannot be assessed on this noncontrast CT examination. Assessment for potential risk factor modification, dietary therapy or pharmacologic therapy may be warranted, if clinically indicated. 2.  Aortic Atherosclerosis (ICD10-I70.0). Electronically Signed   By: Alexandria Angel M.D.   On: 03/30/2023 09:37   DG Knee Complete 4 Views Left Result Date: 06/24/2023 CLINICAL DATA:  pain EXAM: LEFT KNEE - COMPLETE 4+ VIEW COMPARISON:  06/27/2014 FINDINGS: No evidence of fracture, dislocation, or joint effusion. No evidence of arthropathy or other focal bone abnormality. Soft tissues are unremarkable. IMPRESSION: Negative. Electronically Signed   By: Nicoletta Barrier M.D.   On: 06/24/2023 08:52   DG Knee Complete 4 Views Right Result Date: 06/24/2023 CLINICAL DATA:  pain EXAM: RIGHT KNEE - COMPLETE 4+ VIEW COMPARISON:  06/27/2014 FINDINGS: No evidence of fracture, dislocation, or joint effusion. No evidence of arthropathy or other focal bone abnormality. Mild osteopenia. Soft tissues are unremarkable. IMPRESSION: 1. No acute findings. 2. Mild osteopenia. Electronically Signed   By:  Nicoletta Barrier M.D.   On: 06/24/2023 08:51       Assessment & Plan:  Prediabetes -     Basic metabolic panel with GFR; Future -     CBC with Differential/Platelet; Future -     Hemoglobin A1c; Future  Age-related osteoporosis without current pathological fracture -     Basic metabolic panel with GFR; Future -     CBC with Differential/Platelet; Future -     Hepatic function panel; Future -     DG Knee Complete 4 Views Right; Future  Dyslipidemia, goal LDL below 130- LDL goal achieved. Doing well on the statin  -     CBC with Differential/Platelet; Future -     Hepatic function panel; Future -     Lipid panel; Future -     CK; Future -     Rosuvastatin  Calcium ; Take 1 tablet (5 mg total) by mouth daily.  Dispense: 90 tablet; Refill: 1  Chronic pain of both knees -     DG Knee Complete 4 Views Right; Future -     DG Knee Complete 4 Views Left; Future     Follow-up: Return in about 6 months (around 12/24/2023).  Sandra Crouch, MD

## 2023-06-24 NOTE — Patient Instructions (Signed)

## 2023-07-01 ENCOUNTER — Ambulatory Visit

## 2023-07-01 ENCOUNTER — Telehealth: Payer: Self-pay

## 2023-07-01 DIAGNOSIS — M810 Age-related osteoporosis without current pathological fracture: Secondary | ICD-10-CM

## 2023-07-01 MED ORDER — ROMOSOZUMAB-AQQG 105 MG/1.17ML ~~LOC~~ SOSY
210.0000 mg | PREFILLED_SYRINGE | Freq: Once | SUBCUTANEOUS | Status: AC
  Administered 2023-07-01: 210 mg via SUBCUTANEOUS

## 2023-07-01 NOTE — Telephone Encounter (Signed)
 Evenity  VOB initiated via [email or web address]  Last Evenity  inj: 07/01/23 Next Evenity  inj DUE: 07/31/23

## 2023-07-01 NOTE — Telephone Encounter (Signed)
 Pt ready for scheduling for EVENITY  on or after : 07/31/23  Option# 1 Buy/Bill (Office supplied medication)  Out-of-pocket cost due at time of  office visit: $0  Number of injection/visits approved: ---  Primary: MEDICARE Evenity  co-insurance: 0% Admin fee co-insurance: 0%  Secondary: TRICARE Evenity  co-insurance:  Admin fee co-insurance:   Medical Benefit Details: Date Benefits were checked: 07/01/23 Deductible: $257 Met of $257 Required/ Coinsurance: 0%/ Admin Fee: 0%  Prior Auth: N/A PA# Expiration Date:   # of doses approved: ------------------------------------------------------------------------- Option# 2- Med Obtained from pharmacy  Pharmacy benefit: Copay $--- (Paid to pharmacy) Admin Fee: --- (Pay at clinic)  Prior Auth: --- PA# Expiration Date:   # of doses approved:  If patient wants fill through the pharmacy benefit please send prescription to: ---, and include estimated need by date in rx notes. Pharmacy will ship medication directly to the office.  Patient NOT eligible for Evenity  Copay Card. Copay Card can make patient's cost as little as $25. Link to apply: [email or web address]://[email or web address]   This summary of benefits is an estimation of the patient's out-of-pocket cost. Exact cost may very based on individual plan coverage.

## 2023-07-01 NOTE — Telephone Encounter (Signed)
 Tiffany Stevens

## 2023-07-01 NOTE — Progress Notes (Signed)
 After obtaining consent, and per orders of Dr. Rochelle Chu, injection of Evenity  given by Bretta Camp. Patient instructed to to report any adverse reaction to me immediately.

## 2023-07-21 ENCOUNTER — Telehealth: Admitting: Family Medicine

## 2023-07-21 DIAGNOSIS — H019 Unspecified inflammation of eyelid: Secondary | ICD-10-CM

## 2023-07-21 MED ORDER — DOXYCYCLINE HYCLATE 100 MG PO TABS: 100.0000 mg | ORAL_TABLET | Freq: Two times a day (BID) | ORAL | 0 refills | Status: AC

## 2023-07-21 NOTE — Progress Notes (Signed)
 Virtual Visit Consent   Tiffany Stevens, you are scheduled for a virtual visit with a [facility] provider today. Just as with appointments in the office, your consent must be obtained to participate. Your consent will be active for this visit and any virtual visit you may have with one of our providers in the next 365 days. If you have a MyChart account, a copy of this consent can be sent to you electronically.  As this is a virtual visit, video technology does not allow for your provider to perform a traditional examination. This may limit your provider's ability to fully assess your condition. If your provider identifies any concerns that need to be evaluated in person or the need to arrange testing (such as labs, EKG, etc.), we will make arrangements to do so. Although advances in technology are sophisticated, we cannot ensure that it will always work on either your end or our end. If the connection with a video visit is poor, the visit may have to be switched to a telephone visit. With either a video or telephone visit, we are not always able to ensure that we have a secure connection.  By engaging in this virtual visit, you consent to the provision of healthcare and authorize for your insurance to be billed (if applicable) for the services provided during this visit. Depending on your insurance coverage, you may receive a charge related to this service.  I need to obtain your verbal consent now. Are you willing to proceed with your visit today? Tiffany Stevens has provided verbal consent on 07/21/2023 for a virtual visit (video or telephone). Chiquita CHRISTELLA Barefoot, NP  Date: 07/21/2023 10:18 AM   Virtual Visit via Video Note   I, Chiquita CHRISTELLA Barefoot, connected with  Tiffany Stevens  ([id number], [birth year]) on 07/21/23 at 10:15 AM EDT by a video-enabled telemedicine application and verified that I am speaking with the correct person using two identifiers.  Location: Patient: Virtual Visit Location Patient:  Home Provider: Virtual Visit Location Provider: Home Office   I discussed the limitations of evaluation and management by telemedicine and the availability of in person appointments. The patient expressed understanding and agreed to proceed.    History of Present Illness: Tiffany Stevens is a 68 y.o. who identifies as a female who was assigned female at birth, and is being seen today for eyelid infection  Onset was 3-4 days ago Associated symptoms are burning at times, otherwise no pain. Modifying factors are had some mild swelling, and pimple size bump Had a similar issue was treated for similar infection with Doxy from the eye dr.  Does not want eye drops or ointment in the eye. Denies eye pain, changes in vision, no swelling or redness of the orbit area.   Problems:  Patient Active Problem List   Diagnosis Date Noted   Hematuria 06/24/2023   Seasonal allergic rhinitis 06/24/2023   Vitamin D  deficiency 06/24/2023   Chronic pain of both knees 06/24/2023   Dry eyes 03/25/2023   Dyslipidemia, goal LDL below 130 03/25/2023   Prediabetes 09/09/2022   Age-related osteoporosis without current pathological fracture 09/09/2022   Anxiety state 10/11/2014    Allergies: No Known Allergies Medications:  Current Outpatient Medications:    Calcium  Carb-Cholecalciferol (CALCIUM  500/D Sophy), Take by mouth., Disp: , Rfl:    rosuvastatin  (CRESTOR ) 5 MG tablet, Take 1 tablet (5 mg total) by mouth daily., Disp: 90 tablet, Rfl: 1  Observations/Objective: Patient is well-developed, well-nourished in no  acute distress.  Resting comfortably  at home.  Head is normocephalic, atraumatic.  No labored breathing.  Speech is clear and coherent with logical content.  Patient is alert and oriented at baseline.  Eye lid redness and swelling to the left lower area- noted raised bump with white head   Assessment and Plan:   1. Infection of eyelid (Primary)  - doxycycline (VIBRA-TABS) 100 MG tablet; Take 1  tablet (100 mg total) by mouth 2 (two) times daily for 7 days.  Dispense: 14 tablet; Refill: 0   -she does not want to put eye drops or ointment in the eye -will do doxy and advised follow up with Dr Octavia  -no red flags today  Reviewed side effects, risks and benefits of medication.    Patient acknowledged agreement and understanding of the plan.   Past Medical, Surgical, Social History, Allergies, and Medications have been Reviewed.     Follow Up Instructions: I discussed the assessment and treatment plan with the patient. The patient was provided an opportunity to ask questions and all were answered. The patient agreed with the plan and demonstrated an understanding of the instructions.  A copy of instructions were sent to the patient via MyChart unless otherwise noted below.    The patient was advised to call back or seek an in-person evaluation if the symptoms worsen or if the condition fails to improve as anticipated.    Chiquita CHRISTELLA Barefoot, NP

## 2023-07-21 NOTE — Patient Instructions (Addendum)
  Tiffany Stevens, thank you for joining Chiquita CHRISTELLA Barefoot, NP for today's virtual visit.  While this provider is not your primary care provider (PCP), if your PCP is located in our provider database this encounter information will be shared with them immediately following your visit.   A [facility] MyChart account gives you access to today's visit and all your visits, tests, and labs performed at [facility]  click here if you don't have a [facility] MyChart account or go to [email or web address]  Consent: (Patient) Tiffany Stevens provided verbal consent for this virtual visit at the beginning of the encounter.  Current Medications:  Current Outpatient Medications:    doxycycline (VIBRA-TABS) 100 MG tablet, Take 1 tablet (100 mg total) by mouth 2 (two) times daily for 7 days., Disp: 14 tablet, Rfl: 0   Calcium  Carb-Cholecalciferol (CALCIUM  500/D Junell), Take by mouth., Disp: , Rfl:    rosuvastatin  (CRESTOR ) 5 MG tablet, Take 1 tablet (5 mg total) by mouth daily., Disp: 90 tablet, Rfl: 1   Medications ordered in this encounter:  Meds ordered this encounter  Medications   doxycycline (VIBRA-TABS) 100 MG tablet    Sig: Take 1 tablet (100 mg total) by mouth 2 (two) times daily for 7 days.    Dispense:  14 tablet    Refill:  0    Supervising Provider:   BLAISE ALEENE KIDD [[id number]]     *If you need refills on other medications prior to your next appointment, please contact your pharmacy*  Follow-Up: Call back or seek an in-person evaluation if the symptoms worsen or if the condition fails to improve as anticipated.  [facility] [phone]  Other Instructions  -she does not want to put eye drops or ointment in the eye -will do doxy and advised follow up with Dr Octavia    If you have been instructed to have an in-person evaluation today at a local Urgent Care facility, please use the link below. It will take you to a list of all of our available [facility], including address, phone number and hours of operation. Please do not delay care.  [facility]  If you or a family member do not have a primary care provider, use the link below to schedule a visit and establish care. When you choose a [facility] primary care physician or advanced practice provider, you gain a long-term partner in health. Find a Primary Care Provider  Learn more about [facility]'s in-office and virtual care options: [facility] - Get Care Now

## 2023-07-31 ENCOUNTER — Telehealth: Admitting: Physician Assistant

## 2023-07-31 DIAGNOSIS — H019 Unspecified inflammation of eyelid: Secondary | ICD-10-CM | POA: Diagnosis not present

## 2023-07-31 MED ORDER — DOXYCYCLINE HYCLATE 100 MG PO TABS: 100.0000 mg | ORAL_TABLET | Freq: Two times a day (BID) | ORAL | 0 refills | Status: AC

## 2023-07-31 NOTE — Progress Notes (Signed)
 Virtual Visit Consent   Tiffany Stevens, you are scheduled for a virtual visit with a [facility] provider today. Just as with appointments in the office, your consent must be obtained to participate. Your consent will be active for this visit and any virtual visit you may have with one of our providers in the next 365 days. If you have a MyChart account, a copy of this consent can be sent to you electronically.  As this is a virtual visit, video technology does not allow for your provider to perform a traditional examination. This may limit your provider's ability to fully assess your condition. If your provider identifies any concerns that need to be evaluated in person or the need to arrange testing (such as labs, EKG, etc.), we will make arrangements to do so. Although advances in technology are sophisticated, we cannot ensure that it will always work on either your end or our end. If the connection with a video visit is poor, the visit may have to be switched to a telephone visit. With either a video or telephone visit, we are not always able to ensure that we have a secure connection.  By engaging in this virtual visit, you consent to the provision of healthcare and authorize for your insurance to be billed (if applicable) for the services provided during this visit. Depending on your insurance coverage, you may receive a charge related to this service.  I need to obtain your verbal consent now. Are you willing to proceed with your visit today? Tiffany Stevens has provided verbal consent on 07/31/2023 for a virtual visit (video or telephone). Tiffany CHRISTELLA Dickinson, PA-C  Date: 07/31/2023 5:16 PM   Virtual Visit via Video Note   I, Tiffany Stevens, connected with  Tiffany Stevens  ([id number], 1955/02/23) on 07/31/23 at  5:00 PM EDT by a video-enabled telemedicine application and verified that I am speaking with the correct person using two identifiers.  Location: Patient: Virtual Visit Location Patient:  Home Provider: Virtual Visit Location Provider: Home Office   I discussed the limitations of evaluation and management by telemedicine and the availability of in person appointments. The patient expressed understanding and agreed to proceed.    History of Present Illness: Tiffany Stevens is a 68 y.o. who identifies as a female who was assigned female at birth, and is being seen today for recurrent infection of left eyelid.  HPI: Eye Problem  The left eye is affected. This is a recurrent problem. Episode onset: Had occur and was seen virtually on 07/21/23; given Doxycycline  100mg  BID x 7 days; symptoms improved some but did not resolve; then over last 2 days pain and itching increased again. The problem occurs constantly. The problem has been gradually worsening. There was no injury mechanism. The pain is mild. There is No known exposure to pink eye. She Does not wear contacts. Associated symptoms include eye redness, a foreign body sensation and itching. Pertinent negatives include no blurred vision, eye discharge, double vision, nausea, photophobia or recent URI. The treatment provided mild relief.  Does also have an autoimmune issue currently being worked up with possibly testing positive for Sjogren's, which could explain the recurrence due to eye dryness.    Problems:  Patient Active Problem List   Diagnosis Date Noted   Hematuria 06/24/2023   Seasonal allergic rhinitis 06/24/2023   Vitamin D  deficiency 06/24/2023   Chronic pain of both knees 06/24/2023   Dry eyes 03/25/2023   Dyslipidemia, goal LDL  below 130 03/25/2023   Prediabetes 09/09/2022   Age-related osteoporosis without current pathological fracture 09/09/2022   Anxiety state 10/11/2014    Allergies: No Known Allergies Medications:  Current Outpatient Medications:    doxycycline  (VIBRA -TABS) 100 MG tablet, Take 1 tablet (100 mg total) by mouth 2 (two) times daily for 14 days., Disp: 28 tablet, Rfl: 0   Calcium   Carb-Cholecalciferol (CALCIUM  500/D Skilynn), Take by mouth., Disp: , Rfl:    rosuvastatin  (CRESTOR ) 5 MG tablet, Take 1 tablet (5 mg total) by mouth daily., Disp: 90 tablet, Rfl: 1  Observations/Objective: Patient is well-developed, well-nourished in no acute distress.  Resting comfortably at home.  Head is normocephalic, atraumatic.  No labored breathing.  Speech is clear and coherent with logical content.  Patient is alert and oriented at baseline.  2 small red lesions on the left lower eyelid with small pustules present along water  line, patient has had in the past  Assessment and Plan: 1. Infection of eyelid (Primary) - doxycycline  (VIBRA -TABS) 100 MG tablet; Take 1 tablet (100 mg total) by mouth 2 (two) times daily for 14 days.  Dispense: 28 tablet; Refill: 0  - Doxycycline  provided for 2 weeks, previously reports that Ophthalmologist (Dr. Octavia) has given a month dose before to calm down - Warm compresses - Seek in person evaluation with Dr. Octavia in the next 2 weeks for recurrence  Follow Up Instructions: I discussed the assessment and treatment plan with the patient. The patient was provided an opportunity to ask questions and all were answered. The patient agreed with the plan and demonstrated an understanding of the instructions.  A copy of instructions were sent to the patient via MyChart unless otherwise noted below.    The patient was advised to call back or seek an in-person evaluation if the symptoms worsen or if the condition fails to improve as anticipated.    Tiffany CHRISTELLA Dickinson, PA-C

## 2023-07-31 NOTE — Patient Instructions (Signed)
  Tiffany Stevens, thank you for joining Tiffany CHRISTELLA Dickinson, PA-C for today's virtual visit.  While this provider is not your primary care provider (PCP), if your PCP is located in our provider database this encounter information will be shared with them immediately following your visit.   A [facility] MyChart account gives you access to today's visit and all your visits, tests, and labs performed at [facility]  click here if you don't have a [facility] MyChart account or go to [email or web address]  Consent: (Patient) Tiffany Stevens provided verbal consent for this virtual visit at the beginning of the encounter.  Current Medications:  Current Outpatient Medications:    doxycycline  (VIBRA -TABS) 100 MG tablet, Take 1 tablet (100 mg total) by mouth 2 (two) times daily for 14 days., Disp: 28 tablet, Rfl: 0   Calcium  Carb-Cholecalciferol (CALCIUM  500/D [place]), Take by mouth., Disp: , Rfl:    rosuvastatin  (CRESTOR ) 5 MG tablet, Take 1 tablet (5 mg total) by mouth daily., Disp: 90 tablet, Rfl: 1   Medications ordered in this encounter:  Meds ordered this encounter  Medications   doxycycline  (VIBRA -TABS) 100 MG tablet    Sig: Take 1 tablet (100 mg total) by mouth 2 (two) times daily for 14 days.    Dispense:  28 tablet    Refill:  0    Supervising Provider:   BLAISE ALEENE Stevens [[id number]]     *If you need refills on other medications prior to your next appointment, please contact your pharmacy*  Follow-Up: Call back or seek an in-person evaluation if the symptoms worsen or if the condition fails to improve as anticipated.  [facility] [phone]    If you have been instructed to have an in-person evaluation today at a local Urgent Care facility, please use the link below. It will take you to a list of all of our available [facility], including address, phone number and hours of operation. Please do not delay care.  [facility]  If you  or a family member do not have a primary care provider, use the link below to schedule a visit and establish care. When you choose a [facility] primary care physician or advanced practice provider, you gain a long-term partner in health. Find a Primary Care Provider  Learn more about [facility]'s in-office and virtual care options:  - Get Care Now

## 2023-08-05 ENCOUNTER — Ambulatory Visit (INDEPENDENT_AMBULATORY_CARE_PROVIDER_SITE_OTHER)

## 2023-08-05 DIAGNOSIS — M810 Age-related osteoporosis without current pathological fracture: Secondary | ICD-10-CM | POA: Diagnosis not present

## 2023-08-05 MED ORDER — ROMOSOZUMAB-AQQG 105 MG/1.17ML ~~LOC~~ SOSY
210.0000 mg | PREFILLED_SYRINGE | Freq: Once | SUBCUTANEOUS | Status: AC
Start: 2023-08-19 — End: 2023-08-05
  Administered 2023-08-05: 210 mg via SUBCUTANEOUS

## 2023-08-05 NOTE — Progress Notes (Signed)
 After obtaining consent, and per orders of Dr. Joshua, injection of B12 given by Ronnald SHAUNNA Palms. Patient instructed to r report any adverse reaction to me immediately.

## 2023-09-16 ENCOUNTER — Ambulatory Visit (INDEPENDENT_AMBULATORY_CARE_PROVIDER_SITE_OTHER)

## 2023-09-16 DIAGNOSIS — M810 Age-related osteoporosis without current pathological fracture: Secondary | ICD-10-CM | POA: Diagnosis not present

## 2023-09-16 MED ORDER — ROMOSOZUMAB-AQQG 105 MG/1.17ML ~~LOC~~ SOSY
210.0000 mg | PREFILLED_SYRINGE | Freq: Once | SUBCUTANEOUS | Status: AC
  Administered 2023-09-16: 210 mg via SUBCUTANEOUS

## 2023-09-16 NOTE — Progress Notes (Signed)
After obtaining consent, and per orders of Dr. Jones, injection of Evenity given by Regla Fitzgibbon P Correy Weidner. Patient instructed to report any adverse reaction to me immediately.  

## 2023-09-16 NOTE — Progress Notes (Signed)
 Ater obtaining consent, and per orders of Dr. Joshua, injection of Evenity  given by Ronnald SHAUNNA Palms. Patient instructed to r report any adverse reaction to me immediately.

## 2023-10-14 ENCOUNTER — Ambulatory Visit (INDEPENDENT_AMBULATORY_CARE_PROVIDER_SITE_OTHER)

## 2023-10-14 DIAGNOSIS — M810 Age-related osteoporosis without current pathological fracture: Secondary | ICD-10-CM

## 2023-10-14 MED ORDER — ROMOSOZUMAB-AQQG 105 MG/1.17ML ~~LOC~~ SOSY: 210.0000 mg | PREFILLED_SYRINGE | SUBCUTANEOUS | Status: AC

## 2023-10-14 MED ORDER — ROMOSOZUMAB-AQQG 105 MG/1.17ML ~~LOC~~ SOSY
210.0000 mg | PREFILLED_SYRINGE | SUBCUTANEOUS | Status: AC
  Administered 2023-10-14: 210 mg via SUBCUTANEOUS

## 2023-10-14 NOTE — Progress Notes (Signed)
 Patient received evenity  injections today without complication.

## 2023-10-20 ENCOUNTER — Other Ambulatory Visit (HOSPITAL_BASED_OUTPATIENT_CLINIC_OR_DEPARTMENT_OTHER): Payer: Self-pay

## 2023-10-20 MED ORDER — COMIRNATY 30 MCG/0.3ML IM SUSY
0.3000 mL | PREFILLED_SYRINGE | Freq: Once | INTRAMUSCULAR | 0 refills | Status: AC
  Filled 2023-10-20: qty 0.3, 1d supply, fill #0

## 2023-11-11 ENCOUNTER — Ambulatory Visit

## 2023-11-11 DIAGNOSIS — M810 Age-related osteoporosis without current pathological fracture: Secondary | ICD-10-CM | POA: Diagnosis not present

## 2023-11-11 MED ORDER — ROMOSOZUMAB-AQQG 105 MG/1.17ML ~~LOC~~ SOSY
210.0000 mg | PREFILLED_SYRINGE | SUBCUTANEOUS | Status: AC
  Administered 2023-11-11 – 2023-12-09 (×2): 210 mg via SUBCUTANEOUS

## 2023-11-11 NOTE — Progress Notes (Signed)
 Pt received her evenity  injections in both arms and responded well

## 2023-11-18 ENCOUNTER — Other Ambulatory Visit: Payer: Self-pay | Admitting: Internal Medicine

## 2023-11-18 ENCOUNTER — Ambulatory Visit: Admitting: Internal Medicine

## 2023-11-18 ENCOUNTER — Encounter: Payer: Self-pay | Admitting: Internal Medicine

## 2023-11-18 ENCOUNTER — Ambulatory Visit: Payer: Self-pay | Admitting: Internal Medicine

## 2023-11-18 VITALS — BP 108/78 | HR 65 | Temp 98.0°F | Resp 16 | Ht 63.0 in | Wt 109.8 lb

## 2023-11-18 DIAGNOSIS — R7303 Prediabetes: Secondary | ICD-10-CM | POA: Diagnosis not present

## 2023-11-18 DIAGNOSIS — I950 Idiopathic hypotension: Secondary | ICD-10-CM

## 2023-11-18 DIAGNOSIS — R001 Bradycardia, unspecified: Secondary | ICD-10-CM

## 2023-11-18 DIAGNOSIS — M810 Age-related osteoporosis without current pathological fracture: Secondary | ICD-10-CM

## 2023-11-18 DIAGNOSIS — E785 Hyperlipidemia, unspecified: Secondary | ICD-10-CM

## 2023-11-18 DIAGNOSIS — J302 Other seasonal allergic rhinitis: Secondary | ICD-10-CM | POA: Insufficient documentation

## 2023-11-18 DIAGNOSIS — E559 Vitamin D deficiency, unspecified: Secondary | ICD-10-CM

## 2023-11-18 LAB — VITAMIN D 25 HYDROXY (VIT D DEFICIENCY, FRACTURES): VITD: 52.48 ng/mL (ref 30.00–100.00)

## 2023-11-18 LAB — BASIC METABOLIC PANEL WITH GFR
BUN: 11 mg/dL (ref 6–23)
CO2: 28 meq/L (ref 19–32)
Calcium: 9.3 mg/dL (ref 8.4–10.5)
Chloride: 102 meq/L (ref 96–112)
Creatinine, Ser: 0.59 mg/dL (ref 0.40–1.20)
GFR: 91.69 mL/min (ref >60.00)
Glucose, Bld: 94 mg/dL (ref 70–99)
Potassium: 3.8 meq/L (ref 3.5–5.1)
Sodium: 138 meq/L (ref 135–145)

## 2023-11-18 LAB — URINALYSIS, ROUTINE W REFLEX MICROSCOPIC
Bilirubin Urine: NEGATIVE
Ketones, ur: NEGATIVE
Leukocytes,Ua: NEGATIVE
Nitrite: NEGATIVE
Specific Gravity, Urine: 1.01 (ref 1.000–1.030)
Total Protein, Urine: NEGATIVE
Urine Glucose: NEGATIVE
Urobilinogen, UA: 0.2 (ref 0.0–1.0)
pH: 7.5 (ref 5.0–8.0)

## 2023-11-18 LAB — PHOSPHORUS: Phosphorus: 4 mg/dL (ref 2.3–4.6)

## 2023-11-18 LAB — HEMOGLOBIN A1C: Hgb A1c MFr Bld: 6 % (ref 4.6–6.5)

## 2023-11-18 LAB — TSH: TSH: 1.41 u[IU]/mL (ref 0.35–5.50)

## 2023-11-18 LAB — CORTISOL: Cortisol, Plasma: 7.1 ug/dL

## 2023-11-18 NOTE — Progress Notes (Signed)
 Subjective:  Patient ID: [id number], female    DOB: 1955-02-27  Age: 68 y.o. MRN: [id number]  CC: Hyperlipidemia   HPI Tiffany Stevens presents for f/up ---  Discussed the use of AI scribe software for clinical note transcription with the patient, who gave verbal consent to proceed.  History of Present Illness Tiffany Stevens is a 67 year old female who presents with low blood pressure and joint aches.  She reports that her blood pressure is low today, which is usual for her. No dizziness, lightheadedness, chest pain, or shortness of breath currently, although these symptoms occur intermittently.    Outpatient Medications Prior to Visit  Medication Sig Dispense Refill   Calcium  Carb-Cholecalciferol (CALCIUM  500/D Germani) Take by mouth.     rosuvastatin  (CRESTOR ) 5 MG tablet Take 1 tablet (5 mg total) by mouth daily. 90 tablet 1   Facility-Administered Medications Prior to Visit  Medication Dose Route Frequency Provider Last Rate Last Admin   Romosozumab -aqqg (EVENITY ) 105 MG/1. injection 210 mg  210 mg Subcutaneous Q30 days Joshua Debby CROME, MD        ROS Review of Systems  Constitutional:  Negative for appetite change, chills, diaphoresis, fatigue and fever.  HENT: Negative.    Eyes: Negative.   Respiratory: Negative.  Negative for cough, chest tightness, shortness of breath and wheezing.   Cardiovascular:  Negative for chest pain, palpitations and leg swelling.  Gastrointestinal: Negative.  Negative for abdominal pain, constipation, diarrhea, nausea and vomiting.  Endocrine: Negative.   Genitourinary: Negative.  Negative for difficulty urinating.  Musculoskeletal:  Positive for arthralgias. Negative for gait problem, joint swelling and myalgias.  Skin: Negative.   Neurological:  Negative for dizziness, weakness and light-headedness.  Hematological:  Negative for adenopathy. Does not bruise/bleed easily.  Psychiatric/Behavioral: Negative.      Objective:  BP 108/78 (BP  Location: Right Arm, Patient Position: Sitting, Cuff Size: Small)   Pulse 65   Temp 98 F (36.7 C) (Oral)   Resp 16   Ht 5' 3 (1.6 m)   Wt 109 lb 12.8 oz (49.8 kg)   LMP  (LMP Unknown) Comment: level 1 trauma  SpO2 96%   BMI 19.45 kg/m   BP Readings from Last 3 Encounters:  11/18/23 108/78  06/24/23 118/82  03/25/23 122/76    Wt Readings from Last 3 Encounters:  11/18/23 109 lb 12.8 oz (49.8 kg)  06/24/23 110 lb (49.9 kg)  03/25/23 110 lb 3.2 oz (50 kg)    Physical Exam Vitals reviewed.  Constitutional:      Appearance: Normal appearance.  HENT:     Nose: Nose normal.     Mouth/Throat:     Mouth: Mucous membranes are moist.  Eyes:     General: No scleral icterus.    Conjunctiva/sclera: Conjunctivae normal.  Cardiovascular:     Rate and Rhythm: Bradycardia present.     Heart sounds: Normal heart sounds, S1 normal and S2 normal. No murmur heard.    No friction rub. No gallop.     Comments: EKG--- SB (new), 58 bpm No LVH, Q waves, or ST/T wave changes  Pulmonary:     Effort: Pulmonary effort is normal.     Breath sounds: No stridor. No wheezing, rhonchi or rales.  Abdominal:     Palpations: There is no mass.     Tenderness: There is no abdominal tenderness. There is no guarding.     Hernia: No hernia is present.  Musculoskeletal:  Cervical back: Neck supple.     Right lower leg: No edema.     Left lower leg: No edema.  Lymphadenopathy:     Cervical: No cervical adenopathy.  Skin:    General: Skin is warm and dry.  Neurological:     Mental Status: She is alert. Mental status is at baseline.  Psychiatric:        Mood and Affect: Mood normal.        Behavior: Behavior normal.     Lab Results  Component Value Date   WBC 4.5 06/24/2023   HGB 12.5 06/24/2023   HCT 36.8 06/24/2023   PLT 153.0 06/24/2023   GLUCOSE 94 11/18/2023   CHOL 149 06/24/2023   TRIG 50.0 06/24/2023   HDL 69.90 06/24/2023   LDLCALC 70 06/24/2023   ALT 20 06/24/2023   AST 25  06/24/2023   NA 138 11/18/2023   K 3.8 11/18/2023   CL 102 11/18/2023   CREATININE 0.59 11/18/2023   BUN 11 11/18/2023   CO2 28 11/18/2023   TSH 1.41 11/18/2023   INR 1.02 07/19/2014   HGBA1C 6.0 11/18/2023    CT CARDIAC SCORING ([facility] LOCATIONS ONLY) Result Date: 03/30/2023 CLINICAL DATA:  69 year old Asian female with history of high cholesterol. Evaluate for coronary artery disease. * Tracking Code: FCC * EXAM: CT CARDIAC CORONARY ARTERY CALCIUM  SCORE TECHNIQUE: Non-contrast imaging through the heart was performed using prospective ECG gating. Image post processing was performed on an independent workstation, allowing for quantitative analysis of the heart and coronary arteries. Note that this exam targets the heart and the chest was not imaged in its entirety. COMPARISON:  No priors. FINDINGS: CORONARY CALCIUM  SCORES: Left Main: 0 LAD: 0 LCx: 0 RCA/PDA: 14 Total Agatston Score: 14 MESA database percentile: 58th AORTA MEASUREMENTS: Ascending Aorta: 3.3 cm Descending Aorta:2.4 cm OTHER FINDINGS: Atherosclerotic calcifications are noted in the thoracic aorta. Within the visualized portions of the thorax there are no suspicious appearing pulmonary nodules or masses, there is no acute consolidative airspace disease, no pleural effusions, no pneumothorax and no lymphadenopathy. Visualized portions of the upper abdomen are unremarkable. There are no aggressive appearing lytic or blastic lesions noted in the visualized portions of the skeleton. IMPRESSION: 1. Patient's total coronary artery calcium  score is 14 which is 58th percentile for patient's of matched age, gender and race/ethnicity. Please note that although the presence of coronary artery calcium  documents the presence of coronary artery disease, the severity of this disease and any potential stenosis cannot be assessed on this noncontrast CT examination. Assessment for potential risk factor modification, dietary therapy or pharmacologic therapy may  be warranted, if clinically indicated. 2.  Aortic Atherosclerosis (ICD10-I70.0). Electronically Signed   By: Toribio Aye M.D.   On: 03/30/2023 09:37    Assessment & Plan:    Idiopathic hypotension- Labs and EKG are reassuring. -     Cortisol; Future -     TSH; Future -     EKG 12-Lead -     Basic metabolic panel with GFR; Future -     Urinalysis, Routine w reflex microscopic; Future  Dyslipidemia, goal LDL below 130- LDL goal achieved. Doing well on the statin  -     TSH; Future  Age-related osteoporosis without current pathological fracture -     Basic metabolic panel with GFR; Future -     VITAMIN D  25 Hydroxy (Vit-D Deficiency, Fractures); Future -     Phosphorus; Future  Prediabetes -  Basic metabolic panel with GFR; Future -     Hemoglobin A1c; Future  Bradycardia with 51-60 beats per minute- She is asx.     Follow-up: Return in about 4 months (around 03/19/2024).  Debby Molt, MD

## 2023-11-18 NOTE — Progress Notes (Unsigned)
 Subjective:  Patient ID: [id number], female    DOB: April 02, 1955  Age: 68 y.o. MRN: [id number]  CC: No chief complaint on file.   HPI Tiffany Stevens presents for ***  Discussed the use of AI scribe software for clinical note transcription with the patient, who gave verbal consent to proceed.  History of Present Illness      Outpatient Medications Prior to Visit  Medication Sig Dispense Refill   Calcium  Carb-Cholecalciferol (CALCIUM  500/D [place]) Take by mouth.     rosuvastatin  (CRESTOR ) 5 MG tablet Take 1 tablet (5 mg total) by mouth daily. 90 tablet 1   Facility-Administered Medications Prior to Visit  Medication Dose Route Frequency Provider Last Rate Last Admin   Romosozumab -aqqg (EVENITY ) 105 MG/1. injection 210 mg  210 mg Subcutaneous Q30 days Joshua Debby CROME, MD        ROS Review of Systems  Objective:  LMP  (LMP Unknown) Comment: level 1 trauma  BP Readings from Last 3 Encounters:  06/24/23 118/82  03/25/23 122/76  01/12/23 132/86    Wt Readings from Last 3 Encounters:  06/24/23 110 lb (49.9 kg)  03/25/23 110 lb 3.2 oz (50 kg)  01/12/23 109 lb (49.4 kg)    Physical Exam  Lab Results  Component Value Date   WBC 4.5 06/24/2023   HGB 12.5 06/24/2023   HCT 36.8 06/24/2023   PLT 153.0 06/24/2023   GLUCOSE 93 06/24/2023   CHOL 149 06/24/2023   TRIG 50.0 06/24/2023   HDL 69.90 06/24/2023   LDLCALC 70 06/24/2023   ALT 20 06/24/2023   AST 25 06/24/2023   NA 140 06/24/2023   K 3.8 06/24/2023   CL 105 06/24/2023   CREATININE 0.62 06/24/2023   BUN 11 06/24/2023   CO2 28 06/24/2023   TSH 1.05 09/09/2022   INR 1.02 07/19/2014   HGBA1C 6.0 06/24/2023    CT CARDIAC SCORING ([facility] LOCATIONS ONLY) Result Date: 03/30/2023 CLINICAL DATA:  68 year old Asian female with history of high cholesterol. Evaluate for coronary artery disease. * Tracking Code: FCC * EXAM: CT CARDIAC CORONARY ARTERY CALCIUM  SCORE TECHNIQUE: Non-contrast imaging through the heart was  performed using prospective ECG gating. Image post processing was performed on an independent workstation, allowing for quantitative analysis of the heart and coronary arteries. Note that this exam targets the heart and the chest was not imaged in its entirety. COMPARISON:  No priors. FINDINGS: CORONARY CALCIUM  SCORES: Left Main: 0 LAD: 0 LCx: 0 RCA/PDA: 14 Total Agatston Score: 14 MESA database percentile: 58th AORTA MEASUREMENTS: Ascending Aorta: 3.3 cm Descending Aorta:2.4 cm OTHER FINDINGS: Atherosclerotic calcifications are noted in the thoracic aorta. Within the visualized portions of the thorax there are no suspicious appearing pulmonary nodules or masses, there is no acute consolidative airspace disease, no pleural effusions, no pneumothorax and no lymphadenopathy. Visualized portions of the upper abdomen are unremarkable. There are no aggressive appearing lytic or blastic lesions noted in the visualized portions of the skeleton. IMPRESSION: 1. Patient's total coronary artery calcium  score is 14 which is 58th percentile for patient's of matched age, gender and race/ethnicity. Please note that although the presence of coronary artery calcium  documents the presence of coronary artery disease, the severity of this disease and any potential stenosis cannot be assessed on this noncontrast CT examination. Assessment for potential risk factor modification, dietary therapy or pharmacologic therapy may be warranted, if clinically indicated. 2.  Aortic Atherosclerosis (ICD10-I70.0). Electronically Signed   By: Toribio Conley HERO.D.  On: 03/30/2023 09:37    Assessment & Plan:  Prediabetes -     Basic metabolic panel with GFR; Future  Vitamin D  deficiency -     Phosphorus; Future -     VITAMIN D  25 Hydroxy (Vit-D Deficiency, Fractures); Future  Age-related osteoporosis without current pathological fracture -     Phosphorus; Future -     VITAMIN D  25 Hydroxy (Vit-D Deficiency, Fractures); Future -      Basic metabolic panel with GFR; Future     Follow-up: No follow-ups on file.  Debby Molt, MD

## 2023-11-18 NOTE — Patient Instructions (Signed)
 Bradycardia, Adult Bradycardia is a slower-than-normal heartbeat. A normal resting heart rate for an adult ranges from 60 to 100 beats per minute. With bradycardia, the resting heart rate is less than 60 beats per minute. Bradycardia can prevent enough oxygen  from reaching certain areas of your body when you are active. It can be serious if it keeps enough oxygen  from reaching your brain and other parts of your body. Bradycardia is not a problem for everyone. For some healthy adults, a slow resting heart rate is normal. What are the causes? This condition may be caused by: A problem with the heart, including: A problem with the heart's electrical system, such as a heart block. With a heart block, electrical signals between the chambers of the heart are partially or completely blocked, so they are not able to work as they should. A problem with the heart's natural pacemaker (sinus node). Heart disease. A heart attack. Heart damage. Lyme disease. A heart infection. A heart condition that is present at birth (congenital heart defect). Certain medicines that treat heart conditions. Certain conditions, such as hypothyroidism and obstructive sleep apnea. Problems with the balance of chemicals and other substances, like potassium, in the blood. Trauma. Radiation therapy. What increases the risk? You are more likely to develop this condition if you: Are age 30 or older. Have high blood pressure (hypertension), high cholesterol (hyperlipidemia), or diabetes. Drink heavily, use tobacco or nicotine products, or use drugs. What are the signs or symptoms? Symptoms of this condition include: Light-headedness. Feeling faint or fainting. Fatigue and weakness. Trouble with activity or exercise. Shortness of breath. Chest pain (angina). Drowsiness. Confusion. Dizziness. How is this diagnosed? This condition may be diagnosed based on: Your symptoms. Your medical history. A physical exam. During  the exam, your health care provider will listen to your heartbeat and check your pulse. To confirm the diagnosis, your health care provider may order tests, such as: Blood tests. An electrocardiogram (ECG). This test records the heart's electrical activity. The test can show how fast your heart is beating and whether the heartbeat is steady. A test in which you wear a portable device (event recorder or Holter monitor) to record your heart's electrical activity while you go about your day. An exercise test. How is this treated? Treatment for this condition depends on the cause of the condition and how severe your symptoms are. Treatment may involve: Treatment of the underlying condition. Changing your medicines or how much medicine you take. Having a small, battery-operated device called a pacemaker implanted under the skin. When bradycardia occurs, this device can be used to increase your heart rate and help your heart beat in a regular rhythm. Follow these instructions at home: Lifestyle Manage any health conditions that contribute to bradycardia as told by your health care provider. Follow a heart-healthy diet. A nutrition specialist (dietitian) can help educate you about healthy food options and changes. Follow an exercise program that is approved by your health care provider. Maintain a healthy weight. Try to reduce or manage your stress, such as with yoga or meditation. If you need help reducing stress, ask your health care provider. Do not use any products that contain nicotine or tobacco. These products include cigarettes, chewing tobacco, and vaping devices, such as e-cigarettes. If you need help quitting, ask your health care provider. Do not use illegal drugs. Alcohol  use If you drink alcohol : Limit how much you have to: 0-1 drink a day for women who are not pregnant. 0-2 drinks a day  for men. Know how much alcohol  is in a drink. In the [place] one drink equals one 12 oz bottle of  beer (355 mL), one 5 oz glass of wine (148 mL), or one 1 oz glass of hard liquor (44 mL). General instructions Take over-the-counter and prescription medicines only as told by your health care provider. Keep all follow-up visits. This is important. How is this prevented? In some cases, bradycardia may be prevented by: Treating underlying medical problems. Stopping behaviors or medicines that can trigger the condition. Contact a health care provider if: You feel light-headed or dizzy. You almost faint. You feel weak or are easily fatigued during physical activity. You experience confusion or have memory problems. Get help right away if: You faint. You have chest pains or an irregular heartbeat (palpitations). You have trouble breathing. These symptoms may represent a serious problem that is an emergency. Do not wait to see if the symptoms will go away. Get medical help right away. Call your local emergency services (911 in the [place] Do not drive yourself to the hospital. Summary Bradycardia is a slower-than-normal heartbeat. With bradycardia, the resting heart rate is less than 60 beats per minute. Treatment for this condition depends on the cause. Manage any health conditions that contribute to bradycardia as told by your health care provider. Do not use any products that contain nicotine or tobacco. These products include cigarettes, chewing tobacco, and vaping devices, such as e-cigarettes. Keep all follow-up visits. This is important. This information is not intended to replace advice given to you by your health care provider. Make sure you discuss any questions you have with your health care provider. Document Revised: 04/29/2020 Document Reviewed: 04/29/2020 Elsevier Patient Education  2024 [employer].

## 2023-12-02 ENCOUNTER — Ambulatory Visit: Admitting: Internal Medicine

## 2023-12-09 ENCOUNTER — Ambulatory Visit

## 2023-12-09 ENCOUNTER — Telehealth: Payer: Self-pay

## 2023-12-09 DIAGNOSIS — M810 Age-related osteoporosis without current pathological fracture: Secondary | ICD-10-CM | POA: Diagnosis not present

## 2023-12-09 MED ORDER — ROMOSOZUMAB-AQQG 105 MG/1.17ML ~~LOC~~ SOSY
210.0000 mg | PREFILLED_SYRINGE | Freq: Once | SUBCUTANEOUS | Status: AC
  Administered 2024-01-15: 210 mg via SUBCUTANEOUS

## 2023-12-09 MED ORDER — ROMOSOZUMAB-AQQG 105 MG/1.17ML ~~LOC~~ SOSY: 210.0000 mg | PREFILLED_SYRINGE | Freq: Once | SUBCUTANEOUS | Status: DC

## 2023-12-09 NOTE — Telephone Encounter (Signed)
 [name]

## 2023-12-09 NOTE — Telephone Encounter (Signed)
 Pt ready for scheduling for EVENITY  on or after : 12/09/23  Option# 1 Buy/Bill (Office supplied medication)  Out-of-pocket cost due at time of  office visit: $0  Number of injection/visits approved: ---  Primary: MEDICARE Evenity  co-insurance: 0% Admin fee co-insurance: 0%  Secondary: TRICARE Evenity  co-insurance:  Admin fee co-insurance:   Medical Benefit Details: Date Benefits were checked: 12/09/23 Deductible: $257 Met of $257 Required/ Coinsurance: 0%/ Admin Fee: 0%  Prior Auth: N/A PA# Expiration Date:   # of doses approved: ------------------------------------------------------------------------- Option# 2- Med Obtained from pharmacy  Pharmacy benefit: Copay $--- (Paid to pharmacy) Admin Fee: --- (Pay at clinic)  Prior Auth: --- PA# Expiration Date:   # of doses approved:  If patient wants fill through the pharmacy benefit please send prescription to: ---, and include estimated need by date in rx notes. Pharmacy will ship medication directly to the office.  Patient NOT eligible for Evenity  Copay Card. Copay Card can make patient's cost as little as $25. Link to apply: [email or web address]   This summary of benefits is an estimation of the patient's out-of-pocket cost. Exact cost may very based on individual plan coverage.

## 2023-12-09 NOTE — Telephone Encounter (Signed)
 Evenity VOB initiated via [email or web address]

## 2023-12-09 NOTE — Telephone Encounter (Signed)
 Noted patient in office getting the injection today.

## 2023-12-09 NOTE — Progress Notes (Signed)
After obtaining consent, and per orders of Dr. Jones, injection of Evenity given by Regla Fitzgibbon P Correy Weidner. Patient instructed to report any adverse reaction to me immediately.  

## 2024-01-06 ENCOUNTER — Ambulatory Visit

## 2024-01-06 DIAGNOSIS — M810 Age-related osteoporosis without current pathological fracture: Secondary | ICD-10-CM | POA: Diagnosis not present

## 2024-01-06 MED ORDER — ROMOSOZUMAB-AQQG 105 MG/1.17ML ~~LOC~~ SOSY: 210.0000 mg | PREFILLED_SYRINGE | SUBCUTANEOUS | Status: DC

## 2024-01-06 NOTE — Progress Notes (Signed)
 Patient presents in office for monthly evenity  injection. Tolerated well.

## 2024-01-07 MED ORDER — ROMOSOZUMAB-AQQG 105 MG/1.17ML ~~LOC~~ SOSY
210.0000 mg | PREFILLED_SYRINGE | Freq: Once | SUBCUTANEOUS | Status: AC
  Administered 2024-02-08: 210 mg via SUBCUTANEOUS

## 2024-01-07 NOTE — Addendum Note (Signed)
 Addended by: LENARD WILFORD RAMAN on: 01/07/2024 12:56 PM   Modules accepted: Orders

## 2024-01-08 ENCOUNTER — Other Ambulatory Visit: Payer: Self-pay | Admitting: Internal Medicine

## 2024-01-08 DIAGNOSIS — E785 Hyperlipidemia, unspecified: Secondary | ICD-10-CM

## 2024-01-15 DIAGNOSIS — M810 Age-related osteoporosis without current pathological fracture: Secondary | ICD-10-CM

## 2024-01-22 ENCOUNTER — Telehealth: Payer: Self-pay

## 2024-01-22 NOTE — Telephone Encounter (Signed)
 Pt ready for scheduling for EVENITY  on or after : 02/15/24  Option# 1 Buy/Bill (Office supplied medication)  Out-of-pocket cost due at time of  office visit: $0  Number of injection/visits approved: ---  Primary: MEDICARE Evenity  co-insurance: 0% Admin fee co-insurance: 0%  Secondary: TR[employer] FOR LIFE Evenity  co-insurance:  Admin fee co-insurance:   Medical Benefit Details: Date Benefits were checked: 01/22/24 Deductible: $0 Met of $283 Required/ Coinsurance: 0%/ Admin Fee: 0%  Prior Auth: N/A PA# Expiration Date:   # of doses approved: ------------------------------------------------------------------------- Option# 2- Med Obtained from pharmacy  Pharmacy benefit: Copay $--- (Paid to pharmacy) Admin Fee: --- (Pay at clinic)  Prior Auth: --- PA# Expiration Date:   # of doses approved:  If patient wants fill through the pharmacy benefit please send prescription to: ---, and include estimated need by date in rx notes. Pharmacy will ship medication directly to the office.  Patient NOT eligible for Evenity  Copay Card. Copay Card can make patient's cost as little as $25. Link to apply: [email or web address]   This summary of benefits is an estimation of the patient's out-of-pocket cost. Exact cost may very based on individual plan coverage.

## 2024-01-22 NOTE — Telephone Encounter (Signed)
 Tiffany Stevens

## 2024-01-22 NOTE — Telephone Encounter (Signed)
 Evenity  VOB initiated via [email or web address]  Last Evenity  inj: 01/15/24 Next Evenity  inj DUE: 02/15/24

## 2024-02-03 NOTE — Telephone Encounter (Signed)
 Patient is cleared for EVENITY  injection AFTER 02/15/2024 per PA information on 01/22/2024.  Mentioned in provider note last on 11/18/2023. Medication is CLINIC supplied. CoPay:$0

## 2024-02-08 ENCOUNTER — Ambulatory Visit (INDEPENDENT_AMBULATORY_CARE_PROVIDER_SITE_OTHER)

## 2024-02-08 DIAGNOSIS — M810 Age-related osteoporosis without current pathological fracture: Secondary | ICD-10-CM

## 2024-02-08 NOTE — Progress Notes (Signed)
 After obtaining consent, and per orders of Dr. Joshua, injection of Evenity  was given by Ronnald SHAUNNA Palms. Patient instructed to report any adverse reaction to me immediately.

## 2024-02-10 ENCOUNTER — Ambulatory Visit

## 2024-03-02 ENCOUNTER — Ambulatory Visit

## 2024-03-10 ENCOUNTER — Ambulatory Visit

## 2024-03-23 ENCOUNTER — Ambulatory Visit: Admitting: Internal Medicine

## 2024-03-29 ENCOUNTER — Ambulatory Visit: Admitting: Internal Medicine
